# Patient Record
Sex: Female | Born: 1985 | Hispanic: No | Marital: Single | Smoking: Never smoker
Health system: Southern US, Community
[De-identification: ages and names within clinical notes are randomized; demographics above are authoritative.]

## PROBLEM LIST (undated history)

## (undated) DIAGNOSIS — G43909 Migraine, unspecified, not intractable, without status migrainosus: Secondary | ICD-10-CM

## (undated) DIAGNOSIS — R87629 Unspecified abnormal cytological findings in specimens from vagina: Secondary | ICD-10-CM

## (undated) DIAGNOSIS — D649 Anemia, unspecified: Secondary | ICD-10-CM

## (undated) HISTORY — PX: CERVICAL CONE BIOPSY: SUR198

## (undated) HISTORY — DX: Unspecified abnormal cytological findings in specimens from vagina: R87.629

## (undated) HISTORY — PX: CERVICAL CERCLAGE: SHX1329

---

## 2015-02-06 ENCOUNTER — Ambulatory Visit (INDEPENDENT_AMBULATORY_CARE_PROVIDER_SITE_OTHER): Payer: Medicaid Other | Admitting: Certified Nurse Midwife

## 2015-02-06 ENCOUNTER — Encounter: Payer: Self-pay | Admitting: Certified Nurse Midwife

## 2015-02-06 VITALS — BP 104/69 | HR 81

## 2015-02-06 DIAGNOSIS — Z30018 Encounter for initial prescription of other contraceptives: Secondary | ICD-10-CM

## 2015-02-06 DIAGNOSIS — Z113 Encounter for screening for infections with a predominantly sexual mode of transmission: Secondary | ICD-10-CM

## 2015-02-06 DIAGNOSIS — Z0000 Encounter for general adult medical examination without abnormal findings: Secondary | ICD-10-CM

## 2015-02-06 DIAGNOSIS — Z01419 Encounter for gynecological examination (general) (routine) without abnormal findings: Secondary | ICD-10-CM

## 2015-02-06 LAB — COMPREHENSIVE METABOLIC PANEL
ALT: 16 U/L (ref 6–29)
AST: 16 U/L (ref 10–30)
Albumin: 4.1 g/dL (ref 3.6–5.1)
Alkaline Phosphatase: 40 U/L (ref 33–115)
BUN: 11 mg/dL (ref 7–25)
CO2: 21 mmol/L (ref 20–31)
Calcium: 9.2 mg/dL (ref 8.6–10.2)
Chloride: 108 mmol/L (ref 98–110)
Creat: 0.9 mg/dL (ref 0.50–1.10)
Glucose, Bld: 73 mg/dL (ref 65–99)
Potassium: 4.1 mmol/L (ref 3.5–5.3)
Sodium: 139 mmol/L (ref 135–146)
Total Bilirubin: 0.3 mg/dL (ref 0.2–1.2)
Total Protein: 7.2 g/dL (ref 6.1–8.1)

## 2015-02-06 LAB — CBC WITH DIFFERENTIAL/PLATELET
Basophils Absolute: 0 10*3/uL (ref 0.0–0.1)
Basophils Relative: 1 % (ref 0–1)
Eosinophils Absolute: 0.1 10*3/uL (ref 0.0–0.7)
Eosinophils Relative: 2 % (ref 0–5)
HCT: 31.5 % — ABNORMAL LOW (ref 36.0–46.0)
Hemoglobin: 9.7 g/dL — ABNORMAL LOW (ref 12.0–15.0)
Lymphocytes Relative: 51 % — ABNORMAL HIGH (ref 12–46)
Lymphs Abs: 2.2 10*3/uL (ref 0.7–4.0)
MCH: 21.4 pg — ABNORMAL LOW (ref 26.0–34.0)
MCHC: 30.8 g/dL (ref 30.0–36.0)
MCV: 69.5 fL — ABNORMAL LOW (ref 78.0–100.0)
Monocytes Absolute: 0.3 10*3/uL (ref 0.1–1.0)
Monocytes Relative: 8 % (ref 3–12)
Neutro Abs: 1.6 10*3/uL — ABNORMAL LOW (ref 1.7–7.7)
Neutrophils Relative %: 38 % — ABNORMAL LOW (ref 43–77)
Platelets: 358 10*3/uL (ref 150–400)
RBC: 4.53 MIL/uL (ref 3.87–5.11)
RDW: 19 % — ABNORMAL HIGH (ref 11.5–15.5)
WBC: 4.3 10*3/uL (ref 4.0–10.5)

## 2015-02-06 LAB — CHOLESTEROL, TOTAL: Cholesterol: 154 mg/dL (ref 125–200)

## 2015-02-06 LAB — HDL CHOLESTEROL: HDL: 51 mg/dL (ref >=46)

## 2015-02-06 LAB — TRIGLYCERIDES: Triglycerides: 78 mg/dL (ref <150)

## 2015-02-06 MED ORDER — ETONOGESTREL-ETHINYL ESTRADIOL 0.12-0.015 MG/24HR VA RING: VAGINAL_RING | VAGINAL | Status: DC

## 2015-02-06 MED ORDER — CITRANATAL HARMONY 30-1-260 MG PO CAPS: 1.0000 | ORAL_CAPSULE | Freq: Every day | ORAL | Status: DC

## 2015-02-06 NOTE — Progress Notes (Signed)
Patient ID: Andrea Logan, female   DOB: Sep 24, 1985, 29 y.o.   MRN: [id number]    Subjective:        Andrea Logan is a 29 y.o. female here for a routine exam.  Current complaints:  Spotting with Depo-Provera injections.  Spotting, light, varies in duration and flow.  Has fiance', sexually active.   She is a Corporate treasurer.     Personal health questionnaire:  Is patient Ashkenazi Jewish, have a family history of breast and/or ovarian cancer: yes, MGM had ovarian CA Is there a family history of uterine cancer diagnosed at age < 77, gastrointestinal cancer, urinary tract cancer, family member who is a Personnel officer syndrome-associated carrier: no Is the patient overweight and hypertensive, family history of diabetes, personal history of gestational diabetes, preeclampsia or PCOS: yes Is patient over 42, have PCOS,  family history of premature CHD under age 61, diabetes, smoke, have hypertension or peripheral artery disease:  yes At any time, has a partner hit, kicked or otherwise hurt or frightened you?: no Over the past 2 weeks, have you felt down, depressed or hopeless?: no Over the past 2 weeks, have you felt little interest or pleasure in doing things?:no   Gynecologic History No LMP recorded. Contraception: Depo-Provera injections Last Pap: 2015. Results were: abnormal according to the patient, has had cone biopsy in the past Last mammogram: N/A.   Obstetric History OB History  Gravida Para Term Preterm AB SAB TAB Ectopic Multiple Living  3         3    # Outcome Date GA Lbr Len/2nd Weight Sex Delivery Anes PTL Lv  3 Gravida 11/25/13    M Vag-Spont   Y  2 Gravida 04/20/12    M Vag-Spont  Y Y  1 Gravida 06/20/10    F Vag-Spont   Y      History reviewed. No pertinent past medical history.  Past Surgical History  Procedure Laterality Date  . Cervical cone biopsy    . Cervical cerclage       Current outpatient prescriptions:  .  medroxyPROGESTERone (DEPO-PROVERA) 150 MG/ML  injection, Inject 150 mg into the muscle every 3 (three) months., Disp: , Rfl:  Not on File  Social History  Substance Use Topics  . Smoking status: Never Smoker   . Smokeless tobacco: Not on file  . Alcohol Use: No    Family History  Problem Relation Age of Onset  . Hypertension Mother   . Diabetes Mother   . Dementia Father       Review of Systems  Constitutional: negative for fatigue and weight loss Respiratory: negative for cough and wheezing Cardiovascular: negative for chest pain, fatigue and palpitations Gastrointestinal: negative for abdominal pain and change in bowel habits Musculoskeletal:negative for myalgias Neurological: negative for gait problems and tremors Behavioral/Psych: negative for abusive relationship, depression Endocrine: negative for temperature intolerance   Genitourinary:negative for abnormal menstrual periods, genital lesions, hot flashes, sexual problems and vaginal discharge Integument/breast: negative for breast lump, breast tenderness, nipple discharge and skin lesion(s)    Objective:       BP 104/69 mmHg  Pulse 81 General:   alert  Skin:   no rash or abnormalities  Lungs:   clear to auscultation bilaterally  Heart:   regular rate and rhythm, S1, S2 normal, no murmur, click, rub or gallop  Breasts:   normal without suspicious masses, skin or nipple changes or axillary nodes  Abdomen:  normal findings: no organomegaly, soft, non-tender and  no hernia  Pelvis:  External genitalia: normal general appearance Urinary system: urethral meatus normal and bladder without fullness, nontender Vaginal: normal without tenderness, induration or masses Cervix: normal appearance Adnexa: normal bimanual exam Uterus: anteverted and non-tender, normal size   Lab Review Urine pregnancy test Labs reviewed yes Radiologic studies reviewed no  50% of 30 min visit spent on counseling and coordination of care.   Assessment:    Healthy female exam.    Contraception counseling STD screening exam   Plan:    Education reviewed: calcium supplements, depression evaluation, low fat, low cholesterol diet, safe sex/STD prevention, self breast exams, skin cancer screening and weight bearing exercise. Contraception: NuvaRing vaginal inserts. Follow up in: 1 year.   Meds ordered this encounter  Medications  . medroxyPROGESTERone (DEPO-PROVERA) 150 MG/ML injection    Sig: Inject 150 mg into the muscle every 3 (three) months.   No orders of the defined types were placed in this encounter.    Possible management options include: LoLo

## 2015-02-07 ENCOUNTER — Other Ambulatory Visit: Payer: Self-pay | Admitting: Certified Nurse Midwife

## 2015-02-07 DIAGNOSIS — D500 Iron deficiency anemia secondary to blood loss (chronic): Secondary | ICD-10-CM

## 2015-02-07 LAB — PAP IG W/ RFLX HPV ASCU

## 2015-02-07 LAB — HEPATITIS B SURFACE ANTIGEN: Hepatitis B Surface Ag: NEGATIVE

## 2015-02-07 LAB — HEPATITIS C ANTIBODY: HCV Ab: NEGATIVE

## 2015-02-07 LAB — HIV ANTIBODY (ROUTINE TESTING W REFLEX): HIV 1&2 Ab, 4th Generation: NONREACTIVE

## 2015-02-07 LAB — RPR

## 2015-02-07 LAB — TSH: TSH: 0.944 u[IU]/mL (ref 0.350–4.500)

## 2015-02-07 MED ORDER — FUSION PLUS PO CAPS: 1.0000 | ORAL_CAPSULE | Freq: Every day | ORAL | Status: DC

## 2015-02-11 LAB — SURESWAB, VAGINOSIS/VAGINITIS PLUS
Atopobium vaginae: NOT DETECTED Log (cells/mL)
C. albicans, DNA: NOT DETECTED
C. glabrata, DNA: NOT DETECTED
C. parapsilosis, DNA: NOT DETECTED
C. trachomatis RNA, TMA: NOT DETECTED
C. tropicalis, DNA: NOT DETECTED
Gardnerella vaginalis: NOT DETECTED Log (cells/mL)
LACTOBACILLUS SPECIES: NOT DETECTED Log (cells/mL)
MEGASPHAERA SPECIES: NOT DETECTED Log (cells/mL)
N. gonorrhoeae RNA, TMA: NOT DETECTED
T. vaginalis RNA, QL TMA: NOT DETECTED

## 2015-03-13 ENCOUNTER — Ambulatory Visit: Payer: Medicaid Other | Admitting: Certified Nurse Midwife

## 2015-06-01 ENCOUNTER — Emergency Department (HOSPITAL_COMMUNITY)
Admission: EM | Admit: 2015-06-01 | Discharge: 2015-06-01 | Disposition: A | Discharge status: Home / Self Care | Payer: Medicaid Other | Attending: Emergency Medicine | Admitting: Emergency Medicine

## 2015-06-01 ENCOUNTER — Encounter (HOSPITAL_COMMUNITY): Payer: Self-pay | Admitting: Emergency Medicine

## 2015-06-01 DIAGNOSIS — J0100 Acute maxillary sinusitis, unspecified: Secondary | ICD-10-CM | POA: Insufficient documentation

## 2015-06-01 DIAGNOSIS — R0981 Nasal congestion: Secondary | ICD-10-CM | POA: Diagnosis present

## 2015-06-01 DIAGNOSIS — L309 Dermatitis, unspecified: Secondary | ICD-10-CM | POA: Diagnosis not present

## 2015-06-01 DIAGNOSIS — Z79899 Other long term (current) drug therapy: Secondary | ICD-10-CM | POA: Diagnosis not present

## 2015-06-01 DIAGNOSIS — D649 Anemia, unspecified: Secondary | ICD-10-CM | POA: Insufficient documentation

## 2015-06-01 DIAGNOSIS — Z793 Long term (current) use of hormonal contraceptives: Secondary | ICD-10-CM | POA: Diagnosis not present

## 2015-06-01 DIAGNOSIS — Z8679 Personal history of other diseases of the circulatory system: Secondary | ICD-10-CM | POA: Insufficient documentation

## 2015-06-01 HISTORY — DX: Migraine, unspecified, not intractable, without status migrainosus: G43.909

## 2015-06-01 HISTORY — DX: Anemia, unspecified: D64.9

## 2015-06-01 MED ORDER — AMOXICILLIN 500 MG PO CAPS: 500.0000 mg | ORAL_CAPSULE | Freq: Three times a day (TID) | ORAL | Status: DC

## 2015-06-01 MED ORDER — PSEUDOEPHEDRINE-IBUPROFEN 30-200 MG PO TABS: 1.0000 | ORAL_TABLET | Freq: Four times a day (QID) | ORAL | Status: DC | PRN

## 2015-06-01 NOTE — Discharge Instructions (Signed)

## 2015-06-01 NOTE — ED Provider Notes (Signed)
CSN: [id number]     Arrival date & time 06/01/15  1812 History   First MD Initiated Contact with Patient 06/01/15 1836     Chief Complaint  Patient presents with  . Rash  . Nasal Congestion  . Cough     (Consider location/radiation/quality/duration/timing/severity/associated sxs/prior Treatment) Patient is a 30 y.o. female presenting with URI. The history is provided by the patient.  URI Presenting symptoms: congestion, cough, facial pain, rhinorrhea and sore throat   Presenting symptoms: no ear pain and no fever   Severity:  Moderate Onset quality:  Gradual Duration:  3 days Timing:  Constant Progression:  Worsening Chronicity:  New Relieved by:  Nothing Worsened by:  Nothing tried Associated symptoms: headaches, myalgias and sinus pain   Risk factors: sick contacts    Andrea Logan is a 30 y.o. female who presents to the Ed with cough and congestion and a rash x 3 days. Her child has a similar rash and congestion. Child was evaluated for his rash in the Peds ED and treated for eczema. Patient complains of sinus pressure and pain that has gotten worse over the past couple days.   Past Medical History  Diagnosis Date  . Migraine   . Anemia    Past Surgical History  Procedure Laterality Date  . Cervical cone biopsy    . Cervical cerclage     Family History  Problem Relation Age of Onset  . Hypertension Mother   . Diabetes Mother   . Dementia Father    Social History  Substance Use Topics  . Smoking status: Never Smoker   . Smokeless tobacco: None  . Alcohol Use: No   OB History    Gravida Para Term Preterm AB TAB SAB Ectopic Multiple Living   3         3     Review of Systems  Constitutional: Negative for fever.  HENT: Positive for congestion, rhinorrhea, sinus pressure and sore throat. Negative for ear pain.   Respiratory: Positive for cough.   Musculoskeletal: Positive for myalgias.  Skin: Positive for rash.  Neurological: Positive for headaches.  all  other systems negative    Allergies  Review of patient's allergies indicates no known allergies.  Home Medications   Prior to Admission medications   Medication Sig Start Date End Date Taking? Authorizing Provider  amoxicillin (AMOXIL) 500 MG capsule Take 1 capsule (500 mg total) by mouth 3 (three) times daily. 06/01/15   Hope Orlene OchM Neese, NP  etonogestrel-ethinyl estradiol (NUVARING) 0.12-0.015 MG/24HR vaginal ring Insert vaginally and leave in place for 3 consecutive weeks, then remove for 1 week. 02/06/15   Rachelle A Denney, CNM  Iron-FA-B Cmp-C-Biot-Probiotic (FUSION PLUS) CAPS Take 1 tablet by mouth daily. 02/07/15   Rachelle A Denney, CNM  medroxyPROGESTERone (DEPO-PROVERA) 150 MG/ML injection Inject 150 mg into the muscle every 3 (three) months.    Historical Provider, MD  Prenat w/o A-FeCbn-DSS-FA-DHA (CITRANATAL HARMONY) 30-1-260 MG CAPS Take 1 tablet by mouth daily. 02/06/15   Rachelle A Denney, CNM  Pseudoephedrine-Ibuprofen 30-200 MG TABS Take 1 tablet by mouth every 6 (six) hours as needed. 06/01/15   Hope Orlene OchM Neese, NP   BP 100/70 mmHg  Pulse 80  Temp(Src) 98.3 F (36.8 C) (Oral)  Resp 16  Ht 5\' 9"  (1.753 m)  Wt 96.616 kg  BMI 31.44 kg/m2  SpO2 98% Physical Exam  Constitutional: She is oriented to person, place, and time. She appears well-developed and well-nourished. No distress.  HENT:  Head: Normocephalic.  Right Ear: Tympanic membrane normal.  Left Ear: Tympanic membrane normal.  Nose: Mucosal edema and rhinorrhea present. Right sinus exhibits maxillary sinus tenderness. Left sinus exhibits maxillary sinus tenderness.  Mouth/Throat: Uvula is midline and mucous membranes are normal. Posterior oropharyngeal erythema present.  Eyes: Conjunctivae and EOM are normal.  Neck: Normal range of motion. Neck supple.  Cardiovascular: Normal rate and regular rhythm.   Pulmonary/Chest: Effort normal and breath sounds normal. No respiratory distress.  Abdominal: Soft. There is no  tenderness.  Musculoskeletal: Normal range of motion.  Neurological: She is alert and oriented to person, place, and time. No cranial nerve deficit.  Skin: Skin is warm and dry.  Dry patchy areas with itching  Psychiatric: She has a normal mood and affect. Her behavior is normal.  Nursing note and vitals reviewed.   ED Course  Procedures   MDM  30 y.o. female with nasal congestion and sinus tenderness that has increased over the past few days stable for d/c without fever and does not appear toxic. Will treat for sinusitis and she will follow up with her PCP. Discussed with the patient and all questioned fully answered. She will return here if any problems arise.   Final diagnoses:  Acute maxillary sinusitis, recurrence not specified  Eczema       Janne Napoleon, NP 06/02/15 0003  Alvira Monday, MD 06/02/15 1521

## 2015-06-01 NOTE — ED Notes (Signed)
C/o non-productive cough, nasal congestion, and rash x 3 days.

## 2019-05-15 ENCOUNTER — Inpatient Hospital Stay (HOSPITAL_COMMUNITY)
Admission: AD | Admit: 2019-05-15 | Discharge: 2019-05-15 | Disposition: A | Discharge status: Home / Self Care | Payer: Medicaid Other | Attending: Family Medicine | Admitting: Family Medicine

## 2019-05-15 ENCOUNTER — Other Ambulatory Visit: Payer: Self-pay

## 2019-05-15 DIAGNOSIS — Z975 Presence of (intrauterine) contraceptive device: Secondary | ICD-10-CM | POA: Insufficient documentation

## 2019-05-15 DIAGNOSIS — R109 Unspecified abdominal pain: Secondary | ICD-10-CM | POA: Diagnosis not present

## 2019-05-15 NOTE — MAU Provider Note (Signed)
First Provider Initiated Contact with Patient 05/15/19 1739     S [name] is a 33 y.o. G3P0 non-pregnant female who presents to MAU today with request for IUD removal and pregnancy confirmation. Patient states she had an IUD placed about one year ago. She endorses recent mild abdominal cramping, spotting and states her partner told her he could feel her IUD during intercourse.  She has not taken a home pregnancy test  O BP 120/66 (BP Location: Right Arm)   Pulse 83   Temp 98.2 F (36.8 C) (Oral)   Resp 19   Ht 5\' 9"  (1.753 m)   Wt 113.4 kg   LMP 04/18/2019   SpO2 97%   BMI 36.92 kg/m  Physical Exam  Nursing note and vitals reviewed. Constitutional: She is oriented to person, place, and time. She appears well-developed and well-nourished.  Cardiovascular: Normal rate.  Respiratory: Effort normal.  Neurological: She is alert and oriented to person, place, and time.  Skin: Skin is warm and dry.  Psychiatric: She has a normal mood and affect. Her behavior is normal. Judgment and thought content normal.   A Non pregnant female Medical screening exam complete  P Discharge from MAU in stable condition Patient given the option of transfer to [facility] for further evaluation or seek care in outpatient facility of choice List of options for follow-up given  Warning signs for worsening condition that would warrant emergency follow-up discussed Patient may return to MAU as needed for pregnancy related complaints  Mallie Snooks, CNM 05/15/2019 6:51 PM

## 2019-05-15 NOTE — MAU Note (Signed)
Pt presents with c/o VB and cramping.  States she thinks it's d/t her IUD being "out of place"

## 2019-05-31 ENCOUNTER — Other Ambulatory Visit: Payer: Self-pay

## 2019-05-31 ENCOUNTER — Other Ambulatory Visit (HOSPITAL_COMMUNITY)
Admission: RE | Admit: 2019-05-31 | Discharge: 2019-05-31 | Disposition: A | Discharge status: Home / Self Care | Payer: Medicaid Other | Source: Ambulatory Visit | Attending: Obstetrics and Gynecology | Admitting: Obstetrics and Gynecology

## 2019-05-31 ENCOUNTER — Encounter: Payer: Self-pay | Admitting: Obstetrics and Gynecology

## 2019-05-31 ENCOUNTER — Ambulatory Visit: Payer: Medicaid Other | Admitting: Obstetrics and Gynecology

## 2019-05-31 VITALS — BP 122/74 | HR 99 | Temp 98.6°F | Ht 69.0 in | Wt 250.6 lb

## 2019-05-31 DIAGNOSIS — Z01419 Encounter for gynecological examination (general) (routine) without abnormal findings: Secondary | ICD-10-CM | POA: Insufficient documentation

## 2019-05-31 DIAGNOSIS — N898 Other specified noninflammatory disorders of vagina: Secondary | ICD-10-CM | POA: Insufficient documentation

## 2019-05-31 DIAGNOSIS — T8332XA Displacement of intrauterine contraceptive device, initial encounter: Secondary | ICD-10-CM

## 2019-05-31 DIAGNOSIS — Z113 Encounter for screening for infections with a predominantly sexual mode of transmission: Secondary | ICD-10-CM | POA: Diagnosis not present

## 2019-05-31 DIAGNOSIS — Z0000 Encounter for general adult medical examination without abnormal findings: Secondary | ICD-10-CM

## 2019-05-31 NOTE — Progress Notes (Signed)
   WELL-WOMAN PHYSICAL & PAP Patient name: Andrea Logan MRN [id number]  Date of birth: 02/03/1986 Chief Complaint:   Gynecologic Exam  History of Present Illness:   Andrea Logan is a 34 y.o. G3P0 single African American female being seen today for a routine well-woman exam and possible removal of IUD. She is considering a Nexplanon, if her IUD is removed today.  Current complaints: irregular bleeding and partner "can feel the strings".  PCP: none      does desire labs No LMP recorded. (Menstrual status: IUD). The current method of family planning is IUD.  Last pap possibly 2016. In [place]. Results were: normal per pt  Review of Systems:   Pertinent items are noted in HPI Denies any headaches, blurred vision, fatigue, shortness of breath, chest pain, abdominal pain, abnormal vaginal discharge/itching/odor/irritation, problems with periods, bowel movements, urination, or intercourse unless otherwise stated above. Pertinent History Reviewed:  Reviewed past medical,surgical, social and family history.  Reviewed problem list, medications and allergies. Physical Assessment:   Vitals:   05/31/19 0915  BP: 122/74  Pulse: 99  Temp: 98.6 F (37 C)  TempSrc: Oral  Weight: 250 lb 9.6 oz (113.7 kg)  Height: 5\' 9"  (1.753 m)  Body mass index is 37.01 kg/m.        Physical Examination:   General appearance - well appearing, and in no distress  Mental status - alert, oriented to person, place, and time  Psych:  She has a normal mood and affect  Skin - warm and dry, normal color, no suspicious lesions noted  Chest - effort normal, all lung fields clear to auscultation bilaterally  Heart - normal rate and regular rhythm  Neck:  midline trachea, no thyromegaly or nodules  Breasts - breasts appear normal, no suspicious masses, no skin or nipple changes or  axillary nodes  Abdomen - soft, nontender, nondistended, no masses or organomegaly  Pelvic - VULVA: normal appearing vulva with no  masses, tenderness or lesions  VAGINA: normal appearing vagina with normal color and discharge, no lesions  CERVIX: normal appearing cervix without discharge or lesions, no CMT  Thin prep pap is done with HR HPV cotesting  UTERUS: uterus is felt to be normal size, shape, consistency and nontender   ADNEXA: No adnexal masses or tenderness noted.  Rectal - deferred  Extremities:  No swelling or varicosities noted  No results found for this or any previous visit (from the past 24 hour(s)).  Assessment & Plan:  1) Well-Woman Exam with Pap  2) Pap smear, as part of routine gynecological examination - Plan: Cytology - PAP( [place])  3) Screen for STD (sexually transmitted disease)  - Cervicovaginal ancillary only( [place]),  - HIV antibody (with reflex),  - RPR  4) Vaginal discharge  - Cervicovaginal ancillary only( [place])  5) Vaginal odor  - Cervicovaginal ancillary only( [place])  6) Intrauterine contraceptive device threads lost, initial encounter  - PELVIS LIMITED (TRANSABDOMINAL ONLY)   Labs/procedures today: pap and STI testing   Orders Placed This Encounter  Procedures  . HIV antibody (with reflex)  . RPR    Meds: No orders of the defined types were placed in this encounter.   Follow-up: No follow-ups on file.  [place] MSN, CNM 05/31/2019 10:03 AM

## 2019-05-31 NOTE — Patient Instructions (Addendum)
Since your IUD strings were not well visualized on my exam. I have decided for you to have an outpatient formal pelvic ultrasound. Levin Erp will get that scheduled for you and either she will call you with that appointment or the ultrasound department will call you to get it scheduled.

## 2019-06-01 LAB — RPR: RPR Ser Ql: NONREACTIVE

## 2019-06-01 LAB — HIV ANTIBODY (ROUTINE TESTING W REFLEX): HIV Screen 4th Generation wRfx: NONREACTIVE

## 2019-06-04 LAB — CERVICOVAGINAL ANCILLARY ONLY
Bacterial Vaginitis (gardnerella): POSITIVE — AB
Candida Glabrata: NEGATIVE
Candida Vaginitis: NEGATIVE
Chlamydia: NEGATIVE
Comment: NEGATIVE
Comment: NEGATIVE
Comment: NEGATIVE
Comment: NEGATIVE
Comment: NEGATIVE
Comment: NORMAL
Neisseria Gonorrhea: NEGATIVE
Trichomonas: NEGATIVE

## 2019-06-04 LAB — CYTOLOGY - PAP
Comment: NEGATIVE
Diagnosis: NEGATIVE
High risk HPV: NEGATIVE

## 2019-06-05 ENCOUNTER — Telehealth: Payer: Self-pay | Admitting: *Deleted

## 2019-06-05 DIAGNOSIS — B9689 Other specified bacterial agents as the cause of diseases classified elsewhere: Secondary | ICD-10-CM

## 2019-06-05 DIAGNOSIS — N760 Acute vaginitis: Secondary | ICD-10-CM

## 2019-06-05 MED ORDER — METRONIDAZOLE 500 MG PO TABS: 500.0000 mg | ORAL_TABLET | Freq: Two times a day (BID) | ORAL | 0 refills | Status: DC

## 2019-06-05 NOTE — Telephone Encounter (Signed)
-----   Message from Raelyn Mora, [place] sent at 06/05/2019 11:30 AM EST ----- Please treat for BV

## 2019-06-11 ENCOUNTER — Other Ambulatory Visit: Payer: Self-pay | Admitting: *Deleted

## 2019-06-11 DIAGNOSIS — T8332XA Displacement of intrauterine contraceptive device, initial encounter: Secondary | ICD-10-CM

## 2019-06-12 ENCOUNTER — Ambulatory Visit (HOSPITAL_COMMUNITY): Admission: RE | Admit: 2019-06-12 | Payer: Medicaid Other | Source: Ambulatory Visit

## 2019-06-12 ENCOUNTER — Ambulatory Visit (HOSPITAL_COMMUNITY): Payer: Medicaid Other

## 2019-06-19 ENCOUNTER — Ambulatory Visit (HOSPITAL_COMMUNITY): Admission: RE | Admit: 2019-06-19 | Payer: Medicaid Other | Source: Ambulatory Visit

## 2019-06-22 ENCOUNTER — Encounter (HOSPITAL_COMMUNITY): Payer: Self-pay

## 2019-06-22 ENCOUNTER — Ambulatory Visit (HOSPITAL_COMMUNITY): Admission: RE | Admit: 2019-06-22 | Payer: Medicaid Other | Source: Ambulatory Visit

## 2019-12-18 ENCOUNTER — Other Ambulatory Visit: Payer: Self-pay

## 2019-12-18 ENCOUNTER — Ambulatory Visit (INDEPENDENT_AMBULATORY_CARE_PROVIDER_SITE_OTHER): Payer: Medicaid Other | Admitting: *Deleted

## 2019-12-18 ENCOUNTER — Other Ambulatory Visit (HOSPITAL_COMMUNITY)
Admission: RE | Admit: 2019-12-18 | Discharge: 2019-12-18 | Disposition: A | Discharge status: Home / Self Care | Payer: Medicaid Other | Source: Ambulatory Visit | Attending: Obstetrics and Gynecology | Admitting: Obstetrics and Gynecology

## 2019-12-18 VITALS — BP 105/65 | HR 87 | Temp 98.3°F | Ht 69.0 in | Wt 229.2 lb

## 2019-12-18 DIAGNOSIS — N920 Excessive and frequent menstruation with regular cycle: Secondary | ICD-10-CM

## 2019-12-18 DIAGNOSIS — N898 Other specified noninflammatory disorders of vagina: Secondary | ICD-10-CM | POA: Insufficient documentation

## 2019-12-18 NOTE — Progress Notes (Signed)
° °  SUBJECTIVE:  34 y.o. female complains of malodorous vaginal discharge and spotting for 3 day(s). Denies significant pelvic pain or fever. No UTI symptoms. Denies history of known exposure to STD. Last sexual intercourse about 2-3 days ago; per patient protected and unprotected. Patient stated she used boric acid yesterday for her symptoms.  Patient's last menstrual period was 11/26/2019 (exact date).  OBJECTIVE:  She appears well, afebrile. Urine dipstick: not done.  ASSESSMENT:  Vaginal Discharge  Vaginal Odor   PLAN:  GC, chlamydia, trichomonas, BVAG, CVAG probe sent to lab. Treatment: To be determined once lab results are received ROV prn if symptoms persist or worsen.  Clovis Pu, RN

## 2019-12-18 NOTE — Patient Instructions (Addendum)
Vaginitis  Vaginitis is irritation and swelling (inflammation) of the vagina. It happens when normal bacteria and yeast in the vagina grow too much. There are many types of this condition. Treatment will depend on the type you have. Follow these instructions at home: Lifestyle  Keep your vagina area clean and dry. ? Avoid using soap. ? Rinse the area with water.  Do not do the following until your doctor says it is okay: ? Wash and clean out the vagina (douche). ? Use tampons. ? Have sex.  Wipe from front to back after going to the bathroom.  Let air reach your vagina. ? Wear cotton underwear. ? Do not wear:  Underwear while you sleep.  Tight pants.  Thong underwear.  Underwear or nylons without a cotton panel. ? Take off any wet clothing, such as bathing suits, as soon as possible.  Use gentle, non-scented products. Do not use things that can irritate the vagina, such as fabric softeners. Avoid the following products if they are scented: ? Feminine sprays. ? Detergents. ? Tampons. ? Feminine hygiene products. ? Soaps or bubble baths.  Practice safe sex and use condoms. General instructions  Take over-the-counter and prescription medicines only as told by your doctor.  If you were prescribed an antibiotic medicine, take or use it as told by your doctor. Do not stop taking or using the antibiotic even if you start to feel better.  Keep all follow-up visits as told by your doctor. This is important. Contact a doctor if:  You have pain in your belly.  You have a fever.  Your symptoms last for more than 2-3 days. Get help right away if:  You have a fever and your symptoms get worse all of a sudden. Summary  Vaginitis is irritation and swelling of the vagina. It can happen when the normal bacteria and yeast in the vagina grow too much. There are many types.  Treatment will depend on the type you have.  Do not douche, use tampons , or have sex until your  health care provider approves. When you can return to sex, practice safe sex and use condoms. This information is not intended to replace advice given to you by your health care provider. Make sure you discuss any questions you have with your health care provider. Document Revised: 04/15/2017 Document Reviewed: 05/25/2016 Elsevier Patient Education  2020 [employer]  Ingrown Hair  An ingrown hair is a hair that curls and re-enters the skin instead of growing straight out of the skin. An ingrown hair can develop in any part of the skin that hair is removed from. An ingrown hair may cause Zeitlin pockets of infection. What are the causes? An ingrown hair may be caused by:  Shaving.  Tweezing.  Waxing.  Using a hair removal cream. What increases the risk? You are more likely to develop this condition if you have curly hair. What are the signs or symptoms? Symptoms of an ingrown hair may include:  Mah bumps on the skin. The bumps may be filled with pus.  Pain.  Itching. How is this diagnosed? An ingrown hair is diagnosed by a skin exam. How is this treated? Treatment is often not needed unless the ingrown hair has caused an infection. If needed, treatment may include:  Applying prescription creams to the skin. This can help with inflammation.  Applying warm compresses to the skin. This can help soften the skin.  Taking antibiotic medicine. An antibiotic may be prescribed if  the infection is severe.  Retracting and releasing the ingrown hair tips. Follow these instructions at home:  Medicines  Take, apply, or use over-the-counter and prescription medicines only as told by your health care provider. This includes any prescription creams.  If you were prescribed an antibiotic medicine, take it as told by your health care provider. Do not stop using the antibiotic even if you start to feel better. General instructions  Do not shave irritated areas of skin. You may start  shaving these areas again once the irritation has gone away.  To help remove ingrown hairs on your face, you may use a facial sponge in a gentle circular motion.  Do not pick or squeeze the irritated area of skin as this may cause infection and scarring. Managing pain and swelling  If directed, apply heat to the affected area as often as told by your health care provider. Use the heat source that your health care provider recommends, such as a moist heat pack or a heating pad. ? Place a towel between your skin and the heat source. ? Leave the heat on for 20-30 minutes. ? Remove the heat if your skin turns bright red. This is especially important if you are unable to feel pain, heat, or cold. You may have a greater risk of getting burned. How is this prevented?  Shower before shaving.  Wrap areas that you are going to shave in warm, moist wraps for several minutes before shaving. The warmth and moisture help to soften the hairs and makes ingrown hairs less likely.  Use thick shaving gels.  Use a razor that cuts hair slightly above your skin, or use an electric shaver with a long shave setting.  Shave in the direction of hair growth.  Avoid making multiple razor strokes.  Apply a moisturizing lotion after shaving. Summary  An ingrown hair is a hair that curls and re-enters the skin instead of growing straight out of the skin.  Treatment is often not needed unless the ingrown hair has caused an infection.  Take, apply, or use over-the-counter and prescription medicines only as told by your health care provider. This includes any prescription creams.  Do not shave irritated areas of skin. You may start shaving these areas again once the irritation has gone away.  If directed, apply heat to the affected area. Use the heat source that your health care provider recommends, such as a moist heat pack or a heating pad. This information is not intended to replace advice given to you by your  health care provider. Make sure you discuss any questions you have with your health care provider. Document Revised: 11/09/2017 Document Reviewed: 11/09/2017 Elsevier Patient Education  2020 [employer].

## 2019-12-19 ENCOUNTER — Other Ambulatory Visit: Payer: Self-pay | Admitting: *Deleted

## 2019-12-19 DIAGNOSIS — B9689 Other specified bacterial agents as the cause of diseases classified elsewhere: Secondary | ICD-10-CM

## 2019-12-19 DIAGNOSIS — A599 Trichomoniasis, unspecified: Secondary | ICD-10-CM

## 2019-12-19 LAB — CERVICOVAGINAL ANCILLARY ONLY
Bacterial Vaginitis (gardnerella): POSITIVE — AB
Candida Glabrata: NEGATIVE
Candida Vaginitis: NEGATIVE
Chlamydia: NEGATIVE
Comment: NEGATIVE
Comment: NEGATIVE
Comment: NEGATIVE
Comment: NEGATIVE
Comment: NEGATIVE
Comment: NORMAL
Neisseria Gonorrhea: NEGATIVE
Trichomonas: POSITIVE — AB

## 2019-12-19 MED ORDER — METRONIDAZOLE 500 MG PO TABS: 500.0000 mg | ORAL_TABLET | Freq: Two times a day (BID) | ORAL | 0 refills | Status: DC

## 2019-12-19 NOTE — Progress Notes (Signed)
Patient called regarding vaginal swab results. Patient positive for BV and Trich. Metronidazole 500 mg 1 PO BID x 7 days with food.  Clovis Pu, RN

## 2020-01-17 ENCOUNTER — Encounter: Payer: Self-pay | Admitting: Obstetrics and Gynecology

## 2020-01-17 ENCOUNTER — Ambulatory Visit (INDEPENDENT_AMBULATORY_CARE_PROVIDER_SITE_OTHER): Payer: Medicaid Other | Admitting: Obstetrics and Gynecology

## 2020-01-17 ENCOUNTER — Other Ambulatory Visit: Payer: Self-pay

## 2020-01-17 ENCOUNTER — Other Ambulatory Visit (HOSPITAL_COMMUNITY)
Admission: RE | Admit: 2020-01-17 | Discharge: 2020-01-17 | Disposition: A | Discharge status: Home / Self Care | Payer: Medicaid Other | Source: Ambulatory Visit | Attending: Obstetrics and Gynecology | Admitting: Obstetrics and Gynecology

## 2020-01-17 ENCOUNTER — Ambulatory Visit: Payer: Medicaid Other | Admitting: Obstetrics and Gynecology

## 2020-01-17 VITALS — BP 116/75 | HR 88 | Temp 98.0°F | Ht 69.0 in | Wt 231.6 lb

## 2020-01-17 DIAGNOSIS — Z113 Encounter for screening for infections with a predominantly sexual mode of transmission: Secondary | ICD-10-CM

## 2020-01-17 DIAGNOSIS — Z3040 Encounter for surveillance of contraceptives, unspecified: Secondary | ICD-10-CM

## 2020-01-17 NOTE — Progress Notes (Signed)
  GYNECOLOGY PROGRESS NOTE  History:  [name] is a 34 y.o. G3P0 presents to [facility] office today for problem gyn visit. She reports recurrent BV that she thinks is caused by the IUD. She also noticed the recurrent BV after becoming sexually active with the most recent partner who gave her Trichomonas. She reports that she has tried the pill, the patch, Depo, and Mirena IUD. She does not desire to try either one of those methods. She desires a BTL and reports that she has wanted a BTL before getting pregnant with her 2nd child. She denies h/a, dizziness, shortness of breath, n/v, or fever/chills.    The following portions of the patient's history were reviewed and updated as appropriate: allergies, current medications, past family history, past medical history, past social history, past surgical history and problem list.  Review of Systems:  Pertinent items are noted in HPI.  Objective:  Physical Exam Blood pressure 116/75, pulse 88, temperature 98 F (36.7 C), temperature source Oral, height 5\' 9"  (1.753 m), weight 231 lb 9.6 oz (105.1 kg), last menstrual period 01/12/2020. VS reviewed, nursing note reviewed,  Constitutional: well developed, well nourished, no distress HEENT: normocephalic CV: normal rate Pulm/chest wall: normal effort Breast Exam: deferred Abdomen: soft Neuro: alert and oriented x 3 Skin: warm, dry Psych: affect normal Pelvic exam: deferred  Assessment & Plan:  1. Screening examination for STD (sexually transmitted disease) - Cervicovaginal ancillary only ([place]) --TOC  2. Encounter for surveillance of contraceptive device - Patient declines Nexplanon, patch, pill, injection, strongly desires to have BTL - Patient is assured she is "done having babies" - Plan to schedule IUD removal and consultation with MD about BTL  Total time spent discussing [facility] options and coordinating follow-up with MD was 10 minutes.  [facility] CNM 3:22 PM

## 2020-01-18 ENCOUNTER — Other Ambulatory Visit: Payer: Self-pay | Admitting: Obstetrics and Gynecology

## 2020-01-18 DIAGNOSIS — B9689 Other specified bacterial agents as the cause of diseases classified elsewhere: Secondary | ICD-10-CM

## 2020-01-18 DIAGNOSIS — A599 Trichomoniasis, unspecified: Secondary | ICD-10-CM

## 2020-01-18 DIAGNOSIS — N760 Acute vaginitis: Secondary | ICD-10-CM

## 2020-01-18 LAB — CERVICOVAGINAL ANCILLARY ONLY
Bacterial Vaginitis (gardnerella): POSITIVE — AB
Candida Glabrata: NEGATIVE
Candida Vaginitis: NEGATIVE
Chlamydia: NEGATIVE
Comment: NEGATIVE
Comment: NEGATIVE
Comment: NEGATIVE
Comment: NEGATIVE
Comment: NEGATIVE
Comment: NORMAL
Neisseria Gonorrhea: NEGATIVE
Trichomonas: NEGATIVE

## 2020-01-18 MED ORDER — METRONIDAZOLE 500 MG PO TABS: 500.0000 mg | ORAL_TABLET | Freq: Two times a day (BID) | ORAL | 0 refills | Status: DC

## 2020-01-22 ENCOUNTER — Other Ambulatory Visit: Payer: Self-pay

## 2020-01-22 ENCOUNTER — Encounter (INDEPENDENT_AMBULATORY_CARE_PROVIDER_SITE_OTHER): Payer: Self-pay | Admitting: Primary Care

## 2020-01-22 ENCOUNTER — Ambulatory Visit (INDEPENDENT_AMBULATORY_CARE_PROVIDER_SITE_OTHER): Payer: Medicaid Other | Admitting: Primary Care

## 2020-01-22 VITALS — BP 105/64 | HR 84 | Temp 98.5°F | Ht 69.0 in | Wt 232.0 lb

## 2020-01-22 DIAGNOSIS — Z23 Encounter for immunization: Secondary | ICD-10-CM

## 2020-01-22 DIAGNOSIS — Z6834 Body mass index (BMI) 34.0-34.9, adult: Secondary | ICD-10-CM

## 2020-01-22 DIAGNOSIS — E6609 Other obesity due to excess calories: Secondary | ICD-10-CM | POA: Diagnosis not present

## 2020-01-22 DIAGNOSIS — Z3009 Encounter for other general counseling and advice on contraception: Secondary | ICD-10-CM | POA: Diagnosis not present

## 2020-01-22 DIAGNOSIS — Z7689 Persons encountering health services in other specified circumstances: Secondary | ICD-10-CM | POA: Diagnosis not present

## 2020-01-22 NOTE — Progress Notes (Signed)
Established Patient Office Visit  Subjective:  Patient ID: Andrea Logan, female    DOB: 01-08-1986  Age: 34 y.o. MRN: [id number]  CC:  Chief Complaint  Patient presents with  . New Patient (Initial Visit)    HPI Andrea Logan is a 34 year old female who presents today to establish care. Discussed wanting to change birth control.  Past Medical History:  Diagnosis Date  . Anemia   . Migraine     Past Surgical History:  Procedure Laterality Date  . CERVICAL CERCLAGE    . CERVICAL CONE BIOPSY      Family History  Problem Relation Age of Onset  . Hypertension Mother   . Diabetes Mother   . Dementia Father     Social History   Socioeconomic History  . Marital status: Unknown    Spouse name: Not on file  . Number of children: Not on file  . Years of education: Not on file  . Highest education level: Not on file  Occupational History  . Not on file  Tobacco Use  . Smoking status: Never Smoker  . Smokeless tobacco: Never Used  Vaping Use  . Vaping Use: Never used  Substance and Sexual Activity  . Alcohol use: No    Alcohol/week: 0.0 standard drinks  . Drug use: No  . Sexual activity: Yes    Birth control/protection: I.U.D.  Other Topics Concern  . Not on file  Social History Narrative  . Not on file   Social Determinants of Health   Financial Resource Strain:   . Difficulty of Paying Living Expenses: Not on file  Food Insecurity:   . Worried About Programme researcher, broadcasting/film/video in the Last Year: Not on file  . Ran Out of Food in the Last Year: Not on file  Transportation Needs:   . Lack of Transportation (Medical): Not on file  . Lack of Transportation (Non-Medical): Not on file  Physical Activity:   . Days of Exercise per Week: Not on file  . Minutes of Exercise per Session: Not on file  Stress:   . Feeling of Stress : Not on file  Social Connections:   . Frequency of Communication with Friends and Family: Not on file  . Frequency of Social Gatherings  with Friends and Family: Not on file  . Attends Religious Services: Not on file  . Active Member of Clubs or Organizations: Not on file  . Attends Banker Meetings: Not on file  . Marital Status: Not on file  Intimate Partner Violence:   . Fear of Current or Ex-Partner: Not on file  . Emotionally Abused: Not on file  . Physically Abused: Not on file  . Sexually Abused: Not on file    Outpatient Medications Prior to Visit  Medication Sig Dispense Refill  . metroNIDAZOLE (FLAGYL) 500 MG tablet Take 1 tablet (500 mg total) by mouth 2 (two) times daily. 14 tablet 0  . amoxicillin (AMOXIL) 500 MG capsule Take 1 capsule (500 mg total) by mouth 3 (three) times daily. (Patient not taking: Reported on 05/31/2019) 30 capsule 0  . etonogestrel-ethinyl estradiol (NUVARING) 0.12-0.015 MG/24HR vaginal ring Insert vaginally and leave in place for 3 consecutive weeks, then remove for 1 week. 1 each 12  . Iron-FA-B Cmp-C-Biot-Probiotic (FUSION PLUS) CAPS Take 1 tablet by mouth daily. (Patient not taking: Reported on 05/31/2019) 30 capsule 4  . medroxyPROGESTERone (DEPO-PROVERA) 150 MG/ML injection Inject 150 mg into the muscle every 3 (three) months. (  Patient not taking: Reported on 12/18/2019)    . Multiple Vitamin (MULTIVITAMIN) tablet Take 1 tablet by mouth daily. (Patient not taking: Reported on 12/18/2019)    . Prenat w/o A-FeCbn-DSS-FA-DHA (CITRANATAL HARMONY) 30-1-260 MG CAPS Take 1 tablet by mouth daily. (Patient not taking: Reported on 05/31/2019) 30 capsule 12  . Pseudoephedrine-Ibuprofen 30-200 MG TABS Take 1 tablet by mouth every 6 (six) hours as needed. (Patient not taking: Reported on 05/31/2019) 24 each 0   No facility-administered medications prior to visit.    No Known Allergies  ROS Review of Systems  All other systems reviewed and are negative.     Objective:    Physical Exam General: Vital signs reviewed.  Patient is well-developed and well-nourished,obese female Body  mass index is 34.26 kg/m. in no acute distress and cooperative with exam.  Head: Normocephalic and atraumatic. Eyes: EOMI, conjunctivae normal, no scleral icterus.  Neck: Supple, trachea midline, normal ROM, no JVD, masses, thyromegaly, or carotid bruit present.  Cardiovascular: RRR, S1 normal, S2 normal, no murmurs, gallops, or rubs. Pulmonary/Chest: Clear to auscultation bilaterally, no wheezes, rales, or rhonchi. Abdominal: Soft, non-tender, non-distended, BS +, no masses, organomegaly, or guarding present.  Musculoskeletal: No joint deformities, erythema, or stiffness, ROM full and nontender. Extremities: No lower extremity edema bilaterally,  pulses symmetric and intact bilaterally. No cyanosis or clubbing. Neurological: A&O x3, Strength is normal and symmetric bilaterally, cranial nerve II-XII are grossly intact, no focal motor deficit, sensory intact to light touch bilaterally.  Skin: Warm, dry and intact. No rashes or erythema. Psychiatric: Normal mood and affect. speech and behavior is normal. Cognition and memory are normal.   BP 105/64 (BP Location: Right Arm, Patient Position: Sitting, Cuff Size: Large)   Pulse 84   Temp 98.5 F (36.9 C) (Oral)   Ht 5\' 9"  (1.753 m)   Wt 232 lb (105.2 kg)   LMP 01/12/2020   SpO2 97%   BMI 34.26 kg/m  Wt Readings from Last 3 Encounters:  01/22/20 232 lb (105.2 kg)  01/17/20 231 lb 9.6 oz (105.1 kg)  12/18/19 229 lb 3.2 oz (104 kg)     Health Maintenance Due  Topic Date Due  . COVID-19 Vaccine (1) Never done  . TETANUS/TDAP  Never done    There are no preventive care reminders to display for this patient.  Lab Results  Component Value Date   TSH 0.944 02/06/2015   Lab Results  Component Value Date   WBC 4.3 02/06/2015   HGB 9.7 (L) 02/06/2015   HCT 31.5 (L) 02/06/2015   MCV 69.5 (L) 02/06/2015   PLT 358 02/06/2015   Lab Results  Component Value Date   NA 139 02/06/2015   K 4.1 02/06/2015   CO2 21 02/06/2015   GLUCOSE  73 02/06/2015   BUN 11 02/06/2015   CREATININE 0.90 02/06/2015   BILITOT 0.3 02/06/2015   ALKPHOS 40 02/06/2015   AST 16 02/06/2015   ALT 16 02/06/2015   PROT 7.2 02/06/2015   ALBUMIN 4.1 02/06/2015   CALCIUM 9.2 02/06/2015   Lab Results  Component Value Date   CHOL 154 02/06/2015   Lab Results  Component Value Date   HDL 51 02/06/2015   No results found for: [facility] Lab Results  Component Value Date   TRIG 78 02/06/2015   No results found for: CHOLHDL No results found for: 02/08/2015    Assessment & Plan:   Rosely was seen today for new patient (initial visit).  Diagnoses and all  orders for this visit:  Encounter to establish care Gwinda Passe, NP-C will be your  (PCP) she is mastered prepared . She is skilled to diagnosed and treat illness. Also able to answer health concern as well as continuing care of varied medical conditions, not limited by cause, organ system, or diagnosis.    Birth control counseling scheduled appointment at [facility] to discussed permanent birth control options    Class 1 obesity due to excess calories without serious comorbidity with body mass index (BMI) of 34.0 to 34.9 in adult Obesity is 30-39 indicating an excess in caloric intake or underlining conditions. This may lead to other co-morbidities. Example HTN, DM, CVD.Lifestyle modifications of diet and exercise may reduce obesity.     Follow-up: Return in about 6 months (around 07/21/2020), or if symptoms worsen or fail to improve, for weight management in person.    Grayce Sessions, NP

## 2020-01-22 NOTE — Patient Instructions (Addendum)
https://www.cdc.gov/vaccines/hcp/vis/vis-statements/tdap.pdf">  Tdap (Tetanus, Diphtheria, Pertussis) Vaccine: What You Need to Know 1. Why get vaccinated? Tdap vaccine can prevent tetanus, diphtheria, and pertussis. Diphtheria and pertussis spread from person to person. Tetanus enters the body through cuts or wounds.  TETANUS (T) causes painful stiffening of the muscles. Tetanus can lead to serious health problems, including being unable to open the mouth, having trouble swallowing and breathing, or death.  DIPHTHERIA (D) can lead to difficulty breathing, heart failure, paralysis, or death.  PERTUSSIS (aP), also known as "whooping cough," can cause uncontrollable, violent coughing which makes it hard to breathe, eat, or drink. Pertussis can be extremely serious in babies and young children, causing pneumonia, convulsions, brain damage, or death. In teens and adults, it can cause weight loss, loss of bladder control, passing out, and rib fractures from severe coughing. 2. Tdap vaccine Tdap is only for children 7 years and older, adolescents, and adults.  Adolescents should receive a single dose of Tdap, preferably at age 53 or 35 years. Pregnant women should get a dose of Tdap during every pregnancy, to protect the newborn from pertussis. Infants are most at risk for severe, life-threatening complications from pertussis. Adults who have never received Tdap should get a dose of Tdap. Also, adults should receive a booster dose every 10 years, or earlier in the case of a severe and dirty wound or burn. Booster doses can be either Tdap or Td (a different vaccine that protects against tetanus and diphtheria but not pertussis). Tdap may be given at the same time as other vaccines. 3. Talk with your health care provider Tell your vaccine provider if the person getting the vaccine:  Has had an allergic reaction after a previous dose of any vaccine that protects against tetanus, diphtheria, or pertussis,  or has any severe, life-threatening allergies.  Has had a coma, decreased level of consciousness, or prolonged seizures within 7 days after a previous dose of any pertussis vaccine (DTP, DTaP, or Tdap).  Has seizures or another nervous system problem.  Has ever had Guillain-Barr Syndrome (also called GBS).  Has had severe pain or swelling after a previous dose of any vaccine that protects against tetanus or diphtheria. In some cases, your health care provider may decide to postpone Tdap vaccination to a future visit.  People with minor illnesses, such as a cold, may be vaccinated. People who are moderately or severely ill should usually wait until they recover before getting Tdap vaccine.  Your health care provider can give you more information. 4. Risks of a vaccine reaction  Pain, redness, or swelling where the shot was given, mild fever, headache, feeling tired, and nausea, vomiting, diarrhea, or stomachache sometimes happen after Tdap vaccine. People sometimes faint after medical procedures, including vaccination. Tell your provider if you feel dizzy or have vision changes or ringing in the ears.  As with any medicine, there is a very remote chance of a vaccine causing a severe allergic reaction, other serious injury, or death. 5. What if there is a serious problem? An allergic reaction could occur after the vaccinated person leaves the clinic. If you see signs of a severe allergic reaction (hives, swelling of the face and throat, difficulty breathing, a fast heartbeat, dizziness, or weakness), call 9-1-1 and get the person to the nearest hospital. For other signs that concern you, call your health care provider.  Adverse reactions should be reported to the Vaccine Adverse Event Reporting System (VAERS). Your health care provider will usually file this report,  or you can do it yourself. Visit the VAERS website at www.vaers.LAgents.no or call 778-151-8343. VAERS is only for reporting  reactions, and VAERS staff do not give medical advice. 6. The National Vaccine Injury Compensation Program The [employer] Vaccine Injury Compensation Program (VICP) is a federal program that was created to compensate people who may have been injured by certain vaccines. Visit the VICP website at SpiritualWord.at or call (231) 317-7439 to learn about the program and about filing a claim. There is a time limit to file a claim for compensation. 7. How can I learn more?  Ask your health care provider.  Call your local or state health department.  Contact the Centers for Disease Control and Prevention (CDC): ? Call (838)547-6866 (1-800-CDC-INFO) or ? Visit CDC's website at [email or web address].uy Vaccine Information Statement Tdap (Tetanus, Diphtheria, Pertussis) Vaccine (08/16/2018) This information is not intended to replace advice given to you by your health care provider. Make sure you discuss any questions you have with your health care provider. Document Revised: 08/25/2018 Document Reviewed: 08/28/2018 Elsevier Patient Education  2020 [employer]. https://www.cdc.gov/vaccines/hcp/vis/vis-statements/tdap.pdf">  Tdap (Tetanus, Diphtheria, Pertussis) Vaccine: What You Need to Know 1. Why get vaccinated? Tdap vaccine can prevent tetanus, diphtheria, and pertussis. Diphtheria and pertussis spread from person to person. Tetanus enters the body through cuts or wounds.  TETANUS (T) causes painful stiffening of the muscles. Tetanus can lead to serious health problems, including being unable to open the mouth, having trouble swallowing and breathing, or death.  DIPHTHERIA (D) can lead to difficulty breathing, heart failure, paralysis, or death.  PERTUSSIS (aP), also known as "whooping cough," can cause uncontrollable, violent coughing which makes it hard to breathe, eat, or drink. Pertussis can be extremely serious in babies and young children, causing pneumonia, convulsions, brain  damage, or death. In teens and adults, it can cause weight loss, loss of bladder control, passing out, and rib fractures from severe coughing. 2. Tdap vaccine Tdap is only for children 7 years and older, adolescents, and adults.  Adolescents should receive a single dose of Tdap, preferably at age 70 or 12 years. Pregnant women should get a dose of Tdap during every pregnancy, to protect the newborn from pertussis. Infants are most at risk for severe, life-threatening complications from pertussis. Adults who have never received Tdap should get a dose of Tdap. Also, adults should receive a booster dose every 10 years, or earlier in the case of a severe and dirty wound or burn. Booster doses can be either Tdap or Td (a different vaccine that protects against tetanus and diphtheria but not pertussis). Tdap may be given at the same time as other vaccines. 3. Talk with your health care provider Tell your vaccine provider if the person getting the vaccine:  Has had an allergic reaction after a previous dose of any vaccine that protects against tetanus, diphtheria, or pertussis, or has any severe, life-threatening allergies.  Has had a coma, decreased level of consciousness, or prolonged seizures within 7 days after a previous dose of any pertussis vaccine (DTP, DTaP, or Tdap).  Has seizures or another nervous system problem.  Has ever had Guillain-Barr Syndrome (also called GBS).  Has had severe pain or swelling after a previous dose of any vaccine that protects against tetanus or diphtheria. In some cases, your health care provider may decide to postpone Tdap vaccination to a future visit.  People with minor illnesses, such as a cold, may be vaccinated. People who are moderately or severely ill should usually wait until  they recover before getting Tdap vaccine.  Your health care provider can give you more information. 4. Risks of a vaccine reaction  Pain, redness, or swelling where the shot was  given, mild fever, headache, feeling tired, and nausea, vomiting, diarrhea, or stomachache sometimes happen after Tdap vaccine. People sometimes faint after medical procedures, including vaccination. Tell your provider if you feel dizzy or have vision changes or ringing in the ears.  As with any medicine, there is a very remote chance of a vaccine causing a severe allergic reaction, other serious injury, or death. 5. What if there is a serious problem? An allergic reaction could occur after the vaccinated person leaves the clinic. If you see signs of a severe allergic reaction (hives, swelling of the face and throat, difficulty breathing, a fast heartbeat, dizziness, or weakness), call 9-1-1 and get the person to the nearest hospital. For other signs that concern you, call your health care provider.  Adverse reactions should be reported to the Vaccine Adverse Event Reporting System (VAERS). Your health care provider will usually file this report, or you can do it yourself. Visit the VAERS website at www.vaers.LAgents.no or call 818-534-9116. VAERS is only for reporting reactions, and VAERS staff do not give medical advice. 6. The National Vaccine Injury Compensation Program The [employer] Vaccine Injury Compensation Program (VICP) is a federal program that was created to compensate people who may have been injured by certain vaccines. Visit the VICP website at SpiritualWord.at or call (479)612-3435 to learn about the program and about filing a claim. There is a time limit to file a claim for compensation. 7. How can I learn more?  Ask your health care provider.  Call your local or state health department.  Contact the Centers for Disease Control and Prevention (CDC): ? Call 3671473397 (1-800-CDC-INFO) or ? Visit CDC's website at [email or web address] Vaccine Information Statement Tdap (Tetanus, Diphtheria, Pertussis) Vaccine (08/16/2018) This information is not intended to replace  advice given to you by your health care provider. Make sure you discuss any questions you have with your health care provider. Document Revised: 08/25/2018 Document Reviewed: 08/28/2018 Elsevier Patient Education  2020 [employer].

## 2020-01-23 ENCOUNTER — Ambulatory Visit: Payer: Self-pay

## 2020-01-23 NOTE — Telephone Encounter (Signed)
Call patient in regards to reaction from vaccination given in office Tdap.  Information given to take Benadryl was correct.  However patient was very upset that she had been calling since the beginning of the day when the office and the call was returned at 4:35 PM.  Expressed to her message was received 30 minutes prior and seen patient at that time and apologize.  Explained 911 if any facial swelling around lips tongue mouth face.  If local reaction continue with 25 mg of Benadryl every 6 hours as needed.

## 2020-01-23 NOTE — Telephone Encounter (Signed)
Please advise 

## 2020-01-23 NOTE — Telephone Encounter (Signed)
Pt. States she was in the practice yesterday and received a tetanus vaccine.  4 hours later broke out in hives "everywhere." Large raised "whelps." No availability in the office. Pt. Requests to be worked in. Please advise.  Reason for Disposition . [1] Redness or red streak around the injection site AND [2] begins > 48 hours after shot AND [3] fever  Answer Assessment - Initial Assessment Questions 1. SYMPTOMS: "What is the main symptom?" (e.g., redness, swelling, pain)      Hives all over 2. ONSET: "When was the vaccine (shot) given?" "How much later did the hives begin?" (e.g., hours, days ago)      2:00 yesterday   6-7 p.m. rash started 3. SEVERITY: "How bad is it?"      Hives all over - whelps 4. FEVER: "Is there a fever?" If Yes, ask: "What is it, how was it measured, and when did it start?"      No 5. IMMUNIZATIONS GIVEN: "What shots have you recently received?"     Tetanus 6. PAST REACTIONS: "Have you reacted to immunizations before?" If Yes, ask: "What happened?"     No 7. OTHER SYMPTOMS: "Do you have any other symptoms?"     No  Protocols used: IMMUNIZATION REACTIONS-A-AH

## 2020-02-13 ENCOUNTER — Encounter: Payer: Self-pay | Admitting: General Practice

## 2020-02-13 ENCOUNTER — Other Ambulatory Visit: Payer: Self-pay

## 2020-02-13 ENCOUNTER — Ambulatory Visit (INDEPENDENT_AMBULATORY_CARE_PROVIDER_SITE_OTHER): Payer: Medicaid Other | Admitting: Obstetrics & Gynecology

## 2020-02-13 ENCOUNTER — Encounter: Payer: Self-pay | Admitting: Obstetrics & Gynecology

## 2020-02-13 VITALS — BP 115/76 | HR 88 | Temp 98.4°F | Ht 69.0 in | Wt 230.4 lb

## 2020-02-13 DIAGNOSIS — Z30432 Encounter for removal of intrauterine contraceptive device: Secondary | ICD-10-CM

## 2020-02-13 DIAGNOSIS — Z3009 Encounter for other general counseling and advice on contraception: Secondary | ICD-10-CM | POA: Diagnosis not present

## 2020-02-13 NOTE — Patient Instructions (Signed)

## 2020-02-13 NOTE — Progress Notes (Signed)
GYNECOLOGY OFFICE VISIT NOTE  History:   Andrea Logan is a 34 y.o. [id number] here today for IUD removal and consultation about tubal ligation.  Reports having issues with vaginitis and other symptoms with her IUD that has been in place for about two years. Desires removal today, refuses to keep this in place until her tubal ligation. Wants permanent sterilization with tubal ligation.  Not sexually active currently. She denies any current abnormal vaginal discharge, bleeding, pelvic pain or other concerns.    Past Medical History:  Diagnosis Date  . Anemia   . Migraine     Past Surgical History:  Procedure Laterality Date  . CERVICAL CERCLAGE    . CERVICAL CONE BIOPSY      The following portions of the patient's history were reviewed and updated as appropriate: allergies, current medications, past family history, past medical history, past social history, past surgical history and problem list.   Health Maintenance:  Normal pap and negative HRHPV on 05/31/2019.  Review of Systems:  Pertinent items noted in HPI and remainder of comprehensive ROS otherwise negative.  Physical Exam:  BP 115/76 (BP Location: Left Arm, Patient Position: Sitting, Cuff Size: Large)   Pulse 88   Temp 98.4 F (36.9 C) (Oral)   Ht 5\' 9"  (1.753 m)   Wt 230 lb 6.4 oz (104.5 kg)   BMI 34.02 kg/m  CONSTITUTIONAL: Well-developed, well-nourished female in no acute distress.  HEENT:  Normocephalic, atraumatic. External right and left ear normal. No scleral icterus.  NECK: Normal range of motion, supple, no masses noted on observation SKIN: No rash noted. Not diaphoretic. No erythema. No pallor. MUSCULOSKELETAL: Normal range of motion. No edema noted. NEUROLOGIC: Alert and oriented to person, place, and time. Normal muscle tone coordination. No cranial nerve deficit noted. PSYCHIATRIC: Normal mood and affect. Normal behavior. Normal judgment and thought content. CARDIOVASCULAR: Normal heart rate  noted RESPIRATORY: Effort and breath sounds normal, no problems with respiration noted ABDOMEN: No masses noted. No other overt distention noted.   PELVIC: Normal appearing external genitalia; normal urethral meatus; normal appearing vaginal mucosa and cervix.  No abnormal discharge noted. IUD strings seen.   Performed in the presence of a chaperone  IUD Removal  Patient identified, informed consent performed, consent signed.  Patient was in the dorsal lithotomy position, normal external genitalia was noted.  A speculum was placed in the patient's vagina, normal discharge was noted, no lesions. The cervix was visualized, no lesions, no abnormal discharge.  The strings of the IUD were grasped and pulled using ring forceps. The IUD was removed in its entirety.  Patient tolerated the procedure well.        Assessment and Plan:      1. Consultation for female sterilization 2. Encounter for IUD removal IUD removed as per patient's preference. Currently not sexually active, plans condoms if she becomes active.  Patient desires permanent sterilization.  Declines all other reversible forms of contraception (over the counter/barrier methods; hormonal contraceptives including pill, patch, ring, Depo-Provera injection, Nexplanon implant; hormonal IUDs Skyla and Mirena; nonhormonal copper IUD Paragard) were discussed with patient; she declined all these modalities.  Discussed laparoscopic bilateral tubal sterilization using Filshie clips. For the Filshie clip sterilization, she was told she will have one incision in her umbilicus.  Failure risk of 1-2 % with increased risk of ectopic gestation if pregnancy occurs was also discussed with patient.  Reiterated permanence and irreversibility, attempts to reverse tubal sterilization are often not successful.  Also emphasized  increased risk of regret which is noted more in patients less than the age of 19.  All questions were answered. She still desires  laparoscopic bilateral salpingectomy.  Other risks of the procedure were discussed with patient including but not limited to: bleeding, infection, injury to surrounding organs and need for additional procedures.  Also discussed possibility of post-tubal pain syndrome. Patient verbalized understanding of these risks and wants to proceed with this procedure.  She was told that she will be contacted by our surgical scheduler regarding the time and date of her surgery.  She was told she will be called for a preoperative appointment about a week prior to surgery and will be given further preoperative instructions at that visit. Printed patient education handouts about the procedure were given to the patient to review at home. Medicaid papers were signed today.  Routine preventative health maintenance measures emphasized. Please refer to After Visit Summary for other counseling recommendations.   Return for any gynecologic concerns.    Total face-to-face time with patient: 30 minutes.  Over 50% of encounter was spent on counseling and coordination of care.   Jaynie Collins, MD, FACOG Obstetrician & Gynecologist, [facility] for [employer], [facility]

## 2020-04-05 ENCOUNTER — Inpatient Hospital Stay (HOSPITAL_COMMUNITY): Admission: RE | Admit: 2020-04-05 | Payer: Medicaid Other | Source: Ambulatory Visit

## 2020-04-09 ENCOUNTER — Encounter (HOSPITAL_BASED_OUTPATIENT_CLINIC_OR_DEPARTMENT_OTHER): Admission: RE | Payer: Self-pay | Source: Home / Self Care

## 2020-04-09 ENCOUNTER — Ambulatory Visit (HOSPITAL_BASED_OUTPATIENT_CLINIC_OR_DEPARTMENT_OTHER)
Admission: RE | Admit: 2020-04-09 | Payer: Medicaid Other | Source: Home / Self Care | Admitting: Obstetrics & Gynecology

## 2020-04-09 SURGERY — LIGATION, FALLOPIAN TUBE, LAPAROSCOPIC
Anesthesia: Choice | Laterality: Bilateral

## 2020-05-17 NOTE — L&D Delivery Note (Addendum)
OB/GYN Faculty Practice Delivery Note  Andrea Logan is a 35 y.o. [id number] s/p SVD at [redacted]w[redacted]d. She was admitted for SROM.   ROM: 16h 27m with clear fluid GBS Status: positive Maximum Maternal Temperature: 98.7  Labor Progress: Presented with SROM of Twin A and was expectantly managed and progressed to complete.   Delivery Date/Time: [phone] on 10/31 Delivery: Called to room and patient was complete and pushing. Head delivered OA. No nuchal cord present. Shoulder and body delivered in usual fashion. Infant with spontaneous cry, placed on mother's abdomen, dried and stimulated. Cord clamped x 2 after 1-minute delay, and cut by Father of Baby. Cord blood drawn. Placenta was clamped with kelly clamp.  At that time baby was found to have poor tone and was taken to warmer and the NICU team was called. This ultimately resulted in a code APGAR with baby needing significant rescucitation, subsequently had good APGARs and was able to stay with mother postpartum.  The OB team diverted attention to delivery of Twin B. Twin B was found to be transverse on bedside US. An attempt was made by Dr. Crissie Reese to grab the fetal feet but he was unsuccessful. Dr. Charlotta Newton was present in room and gloved, she performed AROM and was able to deliver one leg, however the second leg was straight and pointed towards the uterine fundus. Additionally there was a significant amount of bleeding at the time of AROM. After a short attempt to extract the second fetal leg of Twin B and confirmation was obtained that FHR was in the 60's a code cesarean was called and patient was taken to the OR immediately. See Op note for details of CS and delivery of Twin B  Placenta: intact, delivered in OR Complications: STAT CS called for Twin B Lacerations: None Analgesia:  Epidural   Infant: female  APGARs 1,9  2950g  Warner Mccreedy, MD, MPH OB Fellow, [facility] for [facility], [facility] 03/16/2021, 11:18  AM     Attestation of Attending Supervision of Obstetric Fellow: Evaluation and management procedures were performed by the Obstetric Fellow under my supervision and collaboration.  I have reviewed the Obstetric Fellow's note and chart, and I agree with the management and plan. I have also made any necessary editorial changes.   Venora Maples, MD Family Medicine Attending, [facility] for [facility], [facility] 03/16/2021 2:49 PM

## 2020-08-19 ENCOUNTER — Other Ambulatory Visit: Payer: Self-pay

## 2020-08-19 ENCOUNTER — Encounter (INDEPENDENT_AMBULATORY_CARE_PROVIDER_SITE_OTHER): Payer: Self-pay | Admitting: Primary Care

## 2020-08-19 ENCOUNTER — Ambulatory Visit (INDEPENDENT_AMBULATORY_CARE_PROVIDER_SITE_OTHER): Payer: Medicaid Other | Admitting: Primary Care

## 2020-08-19 VITALS — BP 111/75 | HR 85 | Temp 97.5°F | Ht 69.0 in | Wt 240.0 lb

## 2020-08-19 DIAGNOSIS — F411 Generalized anxiety disorder: Secondary | ICD-10-CM

## 2020-08-19 DIAGNOSIS — N912 Amenorrhea, unspecified: Secondary | ICD-10-CM | POA: Diagnosis not present

## 2020-08-19 DIAGNOSIS — Z3201 Encounter for pregnancy test, result positive: Secondary | ICD-10-CM

## 2020-08-19 DIAGNOSIS — F321 Major depressive disorder, single episode, moderate: Secondary | ICD-10-CM

## 2020-08-19 LAB — POCT URINE PREGNANCY: Preg Test, Ur: POSITIVE — AB

## 2020-08-19 NOTE — Progress Notes (Signed)
Established Patient Office Visit  Subjective:  Patient ID: [name], female    DOB: Oct 10, 1985  Age: 35 y.o. MRN: [id number]  CC:  Chief Complaint  Patient presents with  . Amenorrhea    HPI [name] is a 35 year female who presents for presents for amenorrhea. Patient's last menstrual period was 06/28/2020 (exact date). She has been experiencing nausea and fatigue. 02/05/20 she had her IUD remove and intended to have a tubal ligation. However, her pregnancy test is positive.  Past Medical History:  Diagnosis Date  . Anemia   . Migraine     Past Surgical History:  Procedure Laterality Date  . CERVICAL CERCLAGE    . CERVICAL CONE BIOPSY      Family History  Problem Relation Age of Onset  . Hypertension Mother   . Diabetes Mother   . Dementia Father     Social History   Socioeconomic History  . Marital status: Unknown    Spouse name: Not on file  . Number of children: Not on file  . Years of education: Not on file  . Highest education level: Not on file  Occupational History  . Not on file  Tobacco Use  . Smoking status: Never Smoker  . Smokeless tobacco: Never Used  Vaping Use  . Vaping Use: Never used  Substance and Sexual Activity  . Alcohol use: No    Alcohol/week: 0.0 standard drinks  . Drug use: No  . Sexual activity: Yes    Birth control/protection: I.U.D.  Other Topics Concern  . Not on file  Social History Narrative  . Not on file   Social Determinants of Health   Financial Resource Strain: Not on file  Food Insecurity: Not on file  Transportation Needs: Not on file  Physical Activity: Not on file  Stress: Not on file  Social Connections: Not on file  Intimate Partner Violence: Not on file    No outpatient medications prior to visit.   No facility-administered medications prior to visit.    No Known Allergies  ROS Review of Systems  Constitutional: Positive for fatigue.  Gastrointestinal: Positive for nausea.   Genitourinary: Positive for menstrual problem.  All other systems reviewed and are negative.     Objective:    Physical Exam Vitals reviewed.  Constitutional:      Appearance: She is obese.  HENT:     Head: Normocephalic.     Right Ear: External ear normal.     Left Ear: External ear normal.     Nose: Nose normal.  Eyes:     Extraocular Movements: Extraocular movements intact.  Cardiovascular:     Rate and Rhythm: Normal rate and regular rhythm.  Pulmonary:     Effort: Pulmonary effort is normal.     Breath sounds: Normal breath sounds.  Abdominal:     General: There is distension.     Palpations: Abdomen is soft.  Musculoskeletal:        General: Normal range of motion.     Cervical back: Normal range of motion and neck supple.  Skin:    General: Skin is warm and dry.  Neurological:     Mental Status: She is alert and oriented to person, place, and time.  Psychiatric:        Mood and Affect: Mood normal.        Behavior: Behavior normal.        Thought Content: Thought content normal.  Judgment: Judgment normal.     BP 111/75 (BP Location: Right Arm, Patient Position: Sitting, Cuff Size: Large)   Pulse 85   Temp (!) 97.5 F (36.4 C) (Temporal)   Ht 5\' 9"  (1.753 m)   Wt 240 lb (108.9 kg)   LMP 06/28/2020 (Exact Date)   SpO2 96%   BMI 35.44 kg/m  Wt Readings from Last 3 Encounters:  08/19/20 240 lb (108.9 kg)  02/13/20 230 lb 6.4 oz (104.5 kg)  01/22/20 232 lb (105.2 kg)     Health Maintenance Due  Topic Date Due  . COVID-19 Vaccine (1) Never done    There are no preventive care reminders to display for this patient.  Lab Results  Component Value Date   TSH 0.944 02/06/2015   Lab Results  Component Value Date   WBC 4.3 02/06/2015   HGB 9.7 (L) 02/06/2015   HCT 31.5 (L) 02/06/2015   MCV 69.5 (L) 02/06/2015   PLT 358 02/06/2015   Lab Results  Component Value Date   NA 139 02/06/2015   K 4.1 02/06/2015   CO2 21 02/06/2015   GLUCOSE  73 02/06/2015   BUN 11 02/06/2015   CREATININE 0.90 02/06/2015   BILITOT 0.3 02/06/2015   ALKPHOS 40 02/06/2015   AST 16 02/06/2015   ALT 16 02/06/2015   PROT 7.2 02/06/2015   ALBUMIN 4.1 02/06/2015   CALCIUM 9.2 02/06/2015   Lab Results  Component Value Date   CHOL 154 02/06/2015   Lab Results  Component Value Date   HDL 51 02/06/2015   No results found for: [facility] Lab Results  Component Value Date   TRIG 78 02/06/2015   No results found for: CHOLHDL No results found for: 02/08/2015    Assessment & Plan:  [name] was seen today for amenorrhea.  Diagnoses and all orders for this visit:  Amenorrhea -     POCT urine pregnancy- positive  -     Ambulatory referral to Obstetrics / Gynecology  Positive pregnancy test -     Ambulatory referral to Obstetrics / Gynecology Patient was referred to [facility] and appointment made  GAD (generalized anxiety disorder) Antidepressants cross the placenta and fetal blood brain barrier. Prenatal exposure thus involves potential risks of teratogenesis, preterm birth, low birth weight, and pregnancy complications (eg, spontaneous abortion and postpartum hemorrhage), as well as postnatal effects ( up to date) safest is Prozac discussed risked will re-evaluate after OBGYN visit and recommendation. Therapy can be effective if patient is responsive and agreement to treatment.  Flowsheet Row Office Visit from 08/19/2020 in [facility]  PHQ-9 Total Score 18    At this time with a positive pregnancy test . Counsel/discussion given      Follow-up: Return for after pregnancy.    [facility], NP.

## 2020-08-23 NOTE — Progress Notes (Incomplete)
Established Patient Office Visit  Subjective:  Patient ID: Andrea Logan, female    DOB: 1986-01-11  Age: 35 y.o. MRN: [id number]  CC:  Chief Complaint  Patient presents with  . Amenorrhea    HPI [name] is a 35 year female who presents for presents for amenorrhea. Patient's last menstrual period was 06/28/2020 (exact date). She has been experiencing nausea and fatigue. 02/05/20 she had her IUD remove and intended to have a tubal ligation. However, her pregnancy test is positive.  Past Medical History:  Diagnosis Date  . Anemia   . Migraine     Past Surgical History:  Procedure Laterality Date  . CERVICAL CERCLAGE    . CERVICAL CONE BIOPSY      Family History  Problem Relation Age of Onset  . Hypertension Mother   . Diabetes Mother   . Dementia Father     Social History   Socioeconomic History  . Marital status: Unknown    Spouse name: Not on file  . Number of children: Not on file  . Years of education: Not on file  . Highest education level: Not on file  Occupational History  . Not on file  Tobacco Use  . Smoking status: Never Smoker  . Smokeless tobacco: Never Used  Vaping Use  . Vaping Use: Never used  Substance and Sexual Activity  . Alcohol use: No    Alcohol/week: 0.0 standard drinks  . Drug use: No  . Sexual activity: Yes    Birth control/protection: I.U.D.  Other Topics Concern  . Not on file  Social History Narrative  . Not on file   Social Determinants of Health   Financial Resource Strain: Not on file  Food Insecurity: Not on file  Transportation Needs: Not on file  Physical Activity: Not on file  Stress: Not on file  Social Connections: Not on file  Intimate Partner Violence: Not on file    No outpatient medications prior to visit.   No facility-administered medications prior to visit.    No Known Allergies  ROS Review of Systems  Constitutional: Positive for fatigue.  Gastrointestinal: Positive for nausea.   Genitourinary: Positive for menstrual problem.  All other systems reviewed and are negative.     Objective:    Physical Exam Vitals reviewed.  Constitutional:      Appearance: She is obese.  HENT:     Head: Normocephalic.     Right Ear: External ear normal.     Left Ear: External ear normal.     Nose: Nose normal.  Eyes:     Extraocular Movements: Extraocular movements intact.  Cardiovascular:     Rate and Rhythm: Normal rate and regular rhythm.  Pulmonary:     Effort: Pulmonary effort is normal.     Breath sounds: Normal breath sounds.  Abdominal:     General: There is distension.     Palpations: Abdomen is soft.  Musculoskeletal:        General: Normal range of motion.     Cervical back: Normal range of motion and neck supple.  Skin:    General: Skin is warm and dry.  Neurological:     Mental Status: She is alert and oriented to person, place, and time.  Psychiatric:        Mood and Affect: Mood normal.        Behavior: Behavior normal.        Thought Content: Thought content normal.  Judgment: Judgment normal.     BP 111/75 (BP Location: Right Arm, Patient Position: Sitting, Cuff Size: Large)   Pulse 85   Temp (!) 97.5 F (36.4 C) (Temporal)   Ht 5\' 9"  (1.753 m)   Wt 240 lb (108.9 kg)   LMP 06/28/2020 (Exact Date)   SpO2 96%   BMI 35.44 kg/m  Wt Readings from Last 3 Encounters:  08/19/20 240 lb (108.9 kg)  02/13/20 230 lb 6.4 oz (104.5 kg)  01/22/20 232 lb (105.2 kg)     Health Maintenance Due  Topic Date Due  . COVID-19 Vaccine (1) Never done    There are no preventive care reminders to display for this patient.  Lab Results  Component Value Date   TSH 0.944 02/06/2015   Lab Results  Component Value Date   WBC 4.3 02/06/2015   HGB 9.7 (L) 02/06/2015   HCT 31.5 (L) 02/06/2015   MCV 69.5 (L) 02/06/2015   PLT 358 02/06/2015   Lab Results  Component Value Date   NA 139 02/06/2015   K 4.1 02/06/2015   CO2 21 02/06/2015   GLUCOSE  73 02/06/2015   BUN 11 02/06/2015   CREATININE 0.90 02/06/2015   BILITOT 0.3 02/06/2015   ALKPHOS 40 02/06/2015   AST 16 02/06/2015   ALT 16 02/06/2015   PROT 7.2 02/06/2015   ALBUMIN 4.1 02/06/2015   CALCIUM 9.2 02/06/2015   Lab Results  Component Value Date   CHOL 154 02/06/2015   Lab Results  Component Value Date   HDL 51 02/06/2015   No results found for: [facility] Lab Results  Component Value Date   TRIG 78 02/06/2015   No results found for: CHOLHDL No results found for: 02/08/2015    Assessment & Plan:  Andrea Logan was seen today for amenorrhea.  Diagnoses and all orders for this visit:  Amenorrhea -     POCT urine pregnancy- positive  -     Ambulatory referral to Obstetrics / Gynecology  Positive pregnancy test -     Ambulatory referral to Obstetrics / Gynecology  GAD (generalized anxiety disorder) Antidepressants cross the placenta and fetal blood brain barrier. Prenatal exposure thus involves potential risks of teratogenesis, preterm birth, low birth weight, and pregnancy complications (eg, spontaneous abortion and postpartum hemorrhage), as well as postnatal effects ( up to date) safest is Prozac discussed risked will re-evaluate after OBGYN visit and recommendation. Therapy can be effective if patient is responsive and agreement to treatment.  Flowsheet Row Office Visit from 08/19/2020 in [facility]  PHQ-9 Total Score 18    At this time with a positive pregnancy test       Follow-up: No follow-ups on file.    [facility], NP.mepre

## 2020-08-24 DIAGNOSIS — F32A Depression, unspecified: Secondary | ICD-10-CM

## 2020-08-24 HISTORY — DX: Depression, unspecified: F32.A

## 2020-08-28 ENCOUNTER — Telehealth: Payer: Self-pay

## 2020-08-28 NOTE — Telephone Encounter (Signed)
Patient is aware to discuss with OB at her appt on 4/18.

## 2020-08-28 NOTE — Telephone Encounter (Signed)
Copied from CRM [id number]. Topic: General - Other >> Aug 28, 2020  9:18 AM Andrea Logan wrote: Reason for CRM: Patient called in say that she just found out that she is expecting and need to know how soon to start on prenatal vitamins. Please call Ph# [phone] (

## 2020-09-01 ENCOUNTER — Telehealth (INDEPENDENT_AMBULATORY_CARE_PROVIDER_SITE_OTHER): Payer: Medicaid Other | Admitting: *Deleted

## 2020-09-01 ENCOUNTER — Other Ambulatory Visit: Payer: Self-pay

## 2020-09-01 VITALS — Wt 240.0 lb

## 2020-09-01 DIAGNOSIS — O09521 Supervision of elderly multigravida, first trimester: Secondary | ICD-10-CM

## 2020-09-01 DIAGNOSIS — O0990 Supervision of high risk pregnancy, unspecified, unspecified trimester: Secondary | ICD-10-CM | POA: Insufficient documentation

## 2020-09-01 MED ORDER — GOJJI WEIGHT SCALE MISC: 1.0000 | Freq: Every day | 0 refills | Status: DC | PRN

## 2020-09-01 MED ORDER — PRENATAL 27-1 MG PO TABS: 1.0000 | ORAL_TABLET | Freq: Every day | ORAL | 12 refills | Status: DC

## 2020-09-01 MED ORDER — BLOOD PRESSURE MONITOR AUTOMAT DEVI: 1.0000 | Freq: Every day | 0 refills | Status: DC

## 2020-09-01 NOTE — Progress Notes (Addendum)
  Virtual Visit via Telephone Note  I connected with Andrea Logan on 09/01/20 at  8:30 AM EDT by telephone and verified that I am speaking with the correct person using two identifiers.  Location: Patient: Home Provider: [facility]   I discussed the limitations, risks, security and privacy concerns of performing an evaluation and management service by telephone and the availability of in person appointments. I also discussed with the patient that there may be a patient responsible charge related to this service. The patient expressed understanding and agreed to proceed.   History of Present Illness: PRENATAL INTAKE SUMMARY  Andrea Logan presents today New OB Nurse Interview.  OB History    Gravida  5   Para  3   Term  3   Preterm      AB  1   Living  3     SAB  1   IAB      Ectopic      Multiple      Live Births  3          I have reviewed the patient's medical, obstetrical, social, and family histories, medications, and available lab results.  SUBJECTIVE She has no unusual complaints. Patient reported having ultrasound completed at [facility] on 08/22/20 and carrying twins with gestational age [redacted]w[redacted]d. Reported short cervix with history of cerclage.   Observations/Objective: Initial nurse interview for history/labs (New OB)  EDD: 04/04/2021 by LMP GA: [redacted]w[redacted]d [id number] FHT: non face to face interview  GENERAL APPEARANCE: non face to face interview  Assessment and Plan: Normal pregnancy Prenatal care: [facility] Labs to be completed at next visit with Andrea Logan on Sep 23, 2020 at [facility]. Rx for BP monitor and weight scale sent to [facility] Patient to sign up for [facility] Rx for PNV sent to pharmacy Advised to bring ultrasound report to clinic to scan in record.   Follow Up Instructions:   I discussed the assessment and treatment plan with the patient. The patient was provided an opportunity to ask questions and all were answered. The  patient agreed with the plan and demonstrated an understanding of the instructions.   The patient was advised to call back or seek an in-person evaluation if the symptoms worsen or if the condition fails to improve as anticipated.  I provided 35 minutes of non-face-to-face time during this encounter.   Clovis Pu, RN

## 2020-09-02 ENCOUNTER — Telehealth: Payer: Medicaid Other

## 2020-09-05 ENCOUNTER — Ambulatory Visit (INDEPENDENT_AMBULATORY_CARE_PROVIDER_SITE_OTHER): Payer: Self-pay | Admitting: *Deleted

## 2020-09-05 NOTE — Telephone Encounter (Signed)
Per initial encounter, "Pt is requesting a call back from NT. Pt says that she is pregnant and is experiencing some yellow discharge. Assisted pt by scheduled with PCP next available. Also suggested a OBGYN to pt due to her expecting. Pt says that she is pregnant with twins and would like to just discuss the discharge with a nurse. Pt says that she will also be seeing a OBGYN"; contacted pt to discuss her symptoms; the pt says she is [redacted] weeks pregnant;Her discharge started PM 09/03/20  she is also having lower back pain as discussed with OB-GYN on 09/01/20; recommendations made per nurse triage protocol; the pt is seen by Gwinda Passe, [facility]; spoke with Malachi Bonds and she says the pt is seen at [facility] for [facility] [phone]; Malachi Bonds states this office should have an on-call provider and if the pt is unable to contact this provider, she should be evaluated at the [facility]; pt given information and contact number for the GYN office; the pt verbalized understanding.   Reason for Disposition . Bad smelling vaginal discharge  Answer Assessment - Initial Assessment Questions 1. DISCHARGE: "Describe the discharge." (e.g., white, yellow, green, gray, foamy, cottage cheese-like)     Yellowish-green 2. ODOR: "Is there a bad odor?"     yes 3. ONSET: "When did the discharge begin?"    PM 09/03/20 4. RASH: "Is there a rash in that area?" If Yes, ask: "Describe it." (e.g., redness, blisters, sores, bumps)    no 5. ABDOMINAL PAIN: "Are you having any abdominal pain?" If Yes, ask: "What does it feel like? " (e.g., crampy, dull, intermittent, constant)     no 6. ABDOMINAL PAIN SEVERITY: If present, ask: "How bad is it?"  (e.g., mild, moderate, severe)  - MILD - doesn't interfere with normal activities   - MODERATE - interferes with normal activities or awakens from sleep   - SEVERE - patient doesn't want to move (R/O peritonitis)      no 7. CAUSE: "What do you think is causing the  discharge?" "Have you had the same problem before? What happened then?"     Not sure; previously treated for trich & BV 8. OTHER SYMPTOMS: "Do you have any other symptoms?" (e.g., fever, itching, vaginal bleeding, pain with urination, injury to genital area, vaginal foreign body)    itching 9. PREGNANCY: "Is there any chance you are pregnant?" "When was your last menstrual period?"     [redacted] weeks pregnant  Protocols used: VAGINAL DISCHARGE-A-AH

## 2020-09-08 ENCOUNTER — Other Ambulatory Visit (HOSPITAL_COMMUNITY)
Admission: RE | Admit: 2020-09-08 | Discharge: 2020-09-08 | Disposition: A | Discharge status: Home / Self Care | Payer: Medicaid Other | Source: Ambulatory Visit | Attending: Obstetrics and Gynecology | Admitting: Obstetrics and Gynecology

## 2020-09-08 ENCOUNTER — Other Ambulatory Visit: Payer: Self-pay

## 2020-09-08 ENCOUNTER — Ambulatory Visit (INDEPENDENT_AMBULATORY_CARE_PROVIDER_SITE_OTHER): Payer: Medicaid Other | Admitting: *Deleted

## 2020-09-08 VITALS — BP 110/66 | HR 83 | Temp 97.6°F | Ht 69.0 in | Wt 239.2 lb

## 2020-09-08 DIAGNOSIS — O26899 Other specified pregnancy related conditions, unspecified trimester: Secondary | ICD-10-CM | POA: Insufficient documentation

## 2020-09-08 DIAGNOSIS — N898 Other specified noninflammatory disorders of vagina: Secondary | ICD-10-CM

## 2020-09-08 NOTE — Progress Notes (Signed)
   SUBJECTIVE:  35 y.o. female complains of green and yellow vaginal discharge for 5 day(s). Denies abnormal vaginal bleeding or significant pelvic pain or fever. No UTI symptoms. Denies history of known exposure to STD.  Patient's last menstrual period was 06/28/2020 (exact date).  OBJECTIVE:  She appears well, afebrile. Urine dipstick: not done.  ASSESSMENT:  Vaginal Discharge    PLAN:  GC, chlamydia, trichomonas, BVAG, CVAG probe sent to lab. Treatment: To be determined once lab results are received ROV prn if symptoms persist or worsen.  Clovis Pu, RN

## 2020-09-09 ENCOUNTER — Other Ambulatory Visit: Payer: Self-pay | Admitting: Obstetrics and Gynecology

## 2020-09-09 ENCOUNTER — Telehealth: Payer: Self-pay | Admitting: Primary Care

## 2020-09-09 ENCOUNTER — Other Ambulatory Visit: Payer: Self-pay | Admitting: *Deleted

## 2020-09-09 DIAGNOSIS — A5901 Trichomonal vulvovaginitis: Secondary | ICD-10-CM

## 2020-09-09 DIAGNOSIS — B9689 Other specified bacterial agents as the cause of diseases classified elsewhere: Secondary | ICD-10-CM

## 2020-09-09 DIAGNOSIS — N760 Acute vaginitis: Secondary | ICD-10-CM

## 2020-09-09 LAB — CERVICOVAGINAL ANCILLARY ONLY
Bacterial Vaginitis (gardnerella): POSITIVE — AB
Candida Glabrata: NEGATIVE
Candida Vaginitis: NEGATIVE
Chlamydia: NEGATIVE
Comment: NEGATIVE
Comment: NEGATIVE
Comment: NEGATIVE
Comment: NEGATIVE
Comment: NEGATIVE
Comment: NORMAL
Neisseria Gonorrhea: NEGATIVE
Trichomonas: POSITIVE — AB

## 2020-09-09 MED ORDER — METRONIDAZOLE 500 MG PO TABS: 500.0000 mg | ORAL_TABLET | Freq: Two times a day (BID) | ORAL | 0 refills | Status: DC

## 2020-09-09 NOTE — Telephone Encounter (Signed)
Please send this to the appropriate department. Per chart patient had a nurse visit with [facility] [place] please send tho that nurse.

## 2020-09-09 NOTE — Telephone Encounter (Signed)
Please see encounter for nurse visit

## 2020-09-09 NOTE — Progress Notes (Signed)
   S: Patient called requesting results from vaginal self-swab.    O: Need for treatment of Trich and BV.  A: Metronidazole 500 mg 1 tablet PO BID x 7 days sent to pharmacy  P: Patient to follow up in 1 month for re-screening.  MyChart message sent to patient regarding results. Patient will need to have partner tested/treated at [facility] dept or PCP.  Patient advised to abstain from sex for 7-14 days after treatment or when partner has been tested/treated.    Clovis Pu, RN

## 2020-09-09 NOTE — Progress Notes (Signed)
Rx for Trich & BV already sent on 09/09/20 -- no action required

## 2020-09-09 NOTE — Telephone Encounter (Signed)
Copied from CRM [id number]. Topic: Quick Communication - Rx Refill/Question >> Sep 09, 2020 10:34 AM Aretta Nip wrote: Medication: Pt had a nurse appt yesterday for a discharge and was told a script would be called in and at present has not been called in. Pls submit to   Preferred Pharmacy (with phone number or street name):[id number] - [place], [place] - [address]  [place] [id number] Phone: [phone] Fax: [phone]  Pls FU with pt if not going to submit [phone]  Agent: Please be advised that RX refills may take up to 3 business days. We ask that you follow-up with your pharmacy.

## 2020-09-10 NOTE — Telephone Encounter (Signed)
Copied from CRM [id number]. Topic: General - Other >> Sep 10, 2020 10:25 AM Herby Abraham C wrote: Reason for CRM: pt called in for assistance. Pt says that she was prescribed a medication metroNIDAZOLE (FLAGYL) 500 MG. Pt says that she was told that medication causes birth defects. Pt would like to be advised further by provider.    Please assist pt further.

## 2020-09-10 NOTE — Telephone Encounter (Signed)
Called patient regarding pharmacist at [facility] is refusing to fill medication Metronidazole 500 mg 1 PO BID x 7 days. Pharmacist told patient that he will not fill the medication because it is contraindicated in first trimester of pregnancy. Nurse called to speak with pharmacist at [facility]. Pharmacist Ave Filter stated he was not going to fill the medication due to it be contraindicated. Nurse explained to [place] that metronidazole was a standard of care for our OB/Gyn patients regardless of trimester. He stated, that the medication will not be filled at that [facility], the prescription can be sent to a different pharmacy. Nurse Manveer Gomes called Vonzella Nipple, NP Director of [facility] clinic's and informed her what the situation occurred. Pt's name and DOB given to [place]. She was also given pharmacist name to call the pharmacy. Nurse called the patient back informed her what the pharmacist had said. Patient was then transferred to Raelyn Mora, CNM to discuss the risks/bennefits of taking Metronidazole during pregnancy.  Clovis Pu, RN

## 2020-09-11 MED ORDER — METRONIDAZOLE 500 MG PO TABS: 500.0000 mg | ORAL_TABLET | Freq: Two times a day (BID) | ORAL | 0 refills | Status: DC

## 2020-09-16 ENCOUNTER — Ambulatory Visit (INDEPENDENT_AMBULATORY_CARE_PROVIDER_SITE_OTHER): Payer: Medicaid Other | Admitting: Primary Care

## 2020-09-18 ENCOUNTER — Telehealth: Payer: Self-pay

## 2020-09-18 NOTE — Telephone Encounter (Signed)
Patient called stating that she has an appointment with our office next week for a NOB. Pt has hx of shortened cervix with cerclage. This pregnancy she is pregnant with twins and concerned because she is not scheduled for an ultrasound to check cervical length. Advised patient will send message to Dr. Debroah Loop to see if there is anything we need to do between now and her visit. Please advise.

## 2020-09-18 NOTE — Telephone Encounter (Signed)
LM with patient asking her to bring scan with her to appointment if available and also let her know that will schedule her for cerclage when she is here.

## 2020-09-23 ENCOUNTER — Ambulatory Visit (INDEPENDENT_AMBULATORY_CARE_PROVIDER_SITE_OTHER): Payer: Medicaid Other | Admitting: Obstetrics & Gynecology

## 2020-09-23 ENCOUNTER — Other Ambulatory Visit: Payer: Self-pay

## 2020-09-23 ENCOUNTER — Encounter: Payer: Self-pay | Admitting: Obstetrics & Gynecology

## 2020-09-23 VITALS — BP 111/76 | HR 90 | Wt 236.0 lb

## 2020-09-23 DIAGNOSIS — O0990 Supervision of high risk pregnancy, unspecified, unspecified trimester: Secondary | ICD-10-CM | POA: Diagnosis not present

## 2020-09-23 DIAGNOSIS — Z3A12 12 weeks gestation of pregnancy: Secondary | ICD-10-CM

## 2020-09-23 DIAGNOSIS — O30009 Twin pregnancy, unspecified number of placenta and unspecified number of amniotic sacs, unspecified trimester: Secondary | ICD-10-CM

## 2020-09-23 DIAGNOSIS — Z3481 Encounter for supervision of other normal pregnancy, first trimester: Secondary | ICD-10-CM

## 2020-09-23 DIAGNOSIS — O3440 Maternal care for other abnormalities of cervix, unspecified trimester: Secondary | ICD-10-CM

## 2020-09-23 DIAGNOSIS — D563 Thalassemia minor: Secondary | ICD-10-CM

## 2020-09-23 DIAGNOSIS — Z31430 Encounter of female for testing for genetic disease carrier status for procreative management: Secondary | ICD-10-CM | POA: Diagnosis not present

## 2020-09-23 DIAGNOSIS — Z9889 Other specified postprocedural states: Secondary | ICD-10-CM

## 2020-09-23 DIAGNOSIS — F32A Depression, unspecified: Secondary | ICD-10-CM

## 2020-09-23 DIAGNOSIS — O99340 Other mental disorders complicating pregnancy, unspecified trimester: Secondary | ICD-10-CM

## 2020-09-23 DIAGNOSIS — N883 Incompetence of cervix uteri: Secondary | ICD-10-CM

## 2020-09-23 NOTE — Patient Instructions (Signed)
Cervical Cerclage  Cervical cerclage is a surgical procedure to correct a cervix that opens up and thins out before pregnancy is at term. This is also called cervical insufficiency, or incompetent cervix. This condition can cause labor to start early (prematurely). In this procedure, a health care provider uses stitches (sutures) to sew the cervix shut during pregnancy. Your health care provider may use ultrasound to help guide the procedure and monitor your baby. Ultrasound uses sound waves to take images of your cervix and uterus. The health care provider will assess these images on a monitor in the operating room. Tell a health care provider about:  Any allergies you have, especially any allergies related to prescribed medicine, stitches, or anesthetic medicines.  All medicines you are taking, including vitamins, herbs, eye drops, creams, and over-the-counter medicines.  Your medical history, including prior labor deliveries.  Any problems you or family members have had with anesthetic medicines.  Any blood disorders you have.  Any surgeries you have had, including prior cervical stitching.  Any medical conditions you have or have had. What are the risks? Generally, this is a safe procedure. However, problems may occur, including:  Infection, such as infection of the cervix or the bag of fluid that surrounds the baby (amniotic sac).  Vaginal bleeding.  Allergic reactions to medicines.  Damage to nearby structures or organs, such as injury to the cervix or tearing of the amniotic sac.  Contractions that come too early, including going into early labor and delivery.  Cervical dystocia. This occurs when the cervix is unable to open normally during labor. What happens before the procedure? Staying hydrated Follow instructions from your health care provider about hydration, which may include:  Up to 2 hours before the procedure - you may continue to drink clear liquids, such as  water, clear fruit juice, black coffee, and plain tea.   Eating and drinking restrictions Follow instructions from your health care provider about eating and drinking, which may include:  8 hours before the procedure - stop eating heavy meals or foods, such as meat, fried foods, or fatty foods.  6 hours before the procedure - stop eating light meals or foods, such as toast or cereal.  6 hours before the procedure - stop drinking milk or drinks that contain milk.  2 hours before the procedure - stop drinking clear liquids. Medicines Ask your health care provider about:  Changing or stopping your regular medicines. This is especially important if you are taking diabetes medicines or blood thinners.  Taking medicines such as aspirin and ibuprofen. These medicines can thin your blood. Do not take these medicines unless your health care provider tells you to take them.  Taking over-the-counter medicines, vitamins, herbs, and supplements. Surgery safety Ask your health care provider:  How your surgery site will be marked.  What steps will be taken to help prevent infection. These may include: ? Removing hair at the surgery site. ? Washing skin with a germ-killing soap. ? Taking antibiotic medicine. General instructions  Do not put on any lotion, deodorant, or perfume.  Remove contact lenses and jewelry.  You may have an exam or testing, including blood or urine tests.  Plan to have someone take you home from the hospital or clinic.  If you will be going home right after the procedure, plan to have someone with you for 24 hours. What happens during the procedure?  An IV will be inserted into one of your veins.  You may be given  one or more of the following: ? A medicine to help you relax (sedative). ? A medicine to numb the area (local anesthetic). ? A medicine to make you fall asleep (general anesthetic). ? A medicine that is injected into your spine to numb the area below  and slightly above the injection site (spinal anesthetic).  A lubricated instrument (speculum) will be inserted into your vagina. The speculum will be widened to open the walls of your vagina so your surgeon can see your cervix.  Your cervix will be grasped and tightly sutured to close it. To do this, your surgeon will stitch a strong band of thread around your cervix, then the thread will be tightened to hold your cervix shut. The procedure may vary among health care providers and hospitals. What happens after the procedure?  Your blood pressure, heart rate, breathing rate, and blood oxygen level will be monitored until you leave the hospital or clinic.  You will be monitored for premature contractions.  You may have light bleeding and mild cramping.  You may have to wear compression stockings. These stockings help to prevent blood clots and reduce swelling in your legs.  If you were given a sedative during the procedure, it can affect you for several hours. Do not drive or operate machinery until your health care provider says that it is safe.  You may be put on bed rest.  You may be given an injection of a hormone (progesterone) to prevent premature contractions. Summary  Cervical cerclage is a surgical procedure in which stitches are used to sew the cervix shut during pregnancy.  Before the procedure, tell your health care provider about your medicines, or medical problems or blood disorders that you have.  This is a safe procedure. However, problems may occur, including infection, bleeding, or premature labor.  Follow all instructions about eating and drinking before the procedure. Plan to have someone drive you home from the hospital or clinic. This information is not intended to replace advice given to you by your health care provider. Make sure you discuss any questions you have with your health care provider. Document Revised: 02/27/2019 Document Reviewed: 12/27/2018 Elsevier  Patient Education  2021 [employer].

## 2020-09-23 NOTE — Progress Notes (Signed)
  Subjective:    Andrea Logan is a [id number] [redacted]w[redacted]d being seen today for her first obstetrical visit.  Her obstetrical history is significant for incompetent cervix. Patient does intend to breast feed. Pregnancy history fully reviewed. Twin pregnancy Patient reports nausea.  Vitals:   09/23/20 1519  BP: 111/76  Pulse: 90  Weight: 236 lb (107 kg)    HISTORY: OB History  Gravida Para Term Preterm AB Living  5 3 3   1 3   SAB IAB Ectopic Multiple Live Births  1       3    # Outcome Date GA Lbr Len/2nd Weight Sex Delivery Anes PTL Lv  5 Current           4 Term 11/25/13 [redacted]w[redacted]d  7 lb 10 oz (3.459 kg) M Vag-Spont   LIV  3 Term 04/20/12 [redacted]w[redacted]d  7 lb 8 oz (3.402 kg) M Vag-Spont  Y LIV  2 Term 06/20/10 [redacted]w[redacted]d  9 lb 14 oz (4.479 kg) F Vag-Spont   LIV  1 SAB 02/26/10 [redacted]w[redacted]d          Past Medical History:  Diagnosis Date  . Anemia   . Migraine    Past Surgical History:  Procedure Laterality Date  . CERVICAL CERCLAGE    . CERVICAL CONE BIOPSY     Family History  Problem Relation Age of Onset  . Hypertension Mother   . Diabetes Mother   . Dementia Father      Exam    Uterus:     Pelvic Exam:    Perineum: No Hemorrhoids   Vulva: normal   Vagina:  normal mucosa   pH:     Cervix: no lesions   Adnexa: normal adnexa   Bony Pelvis: average  System: Breast:  normal appearance, no masses or tenderness   Skin: normal coloration and turgor, no rashes    Neurologic: oriented, normal mood   Extremities: normal strength, tone, and muscle mass   HEENT PERRLA   Mouth/Teeth mucous membranes moist, pharynx normal without lesions and dental hygiene good   Neck supple   Cardiovascular: regular rate and rhythm, no murmurs or gallops   Respiratory:  appears well, vitals normal, no respiratory distress, acyanotic, normal RR, neck free of mass or lymphadenopathy, chest clear, no wheezing, crepitations, rhonchi, normal symmetric air entry   Abdomen: obese   Urinary: urethral meatus normal       Assessment:    Pregnancy: [redacted]w[redacted]d Patient Active Problem List   Diagnosis Date Noted  . Supervision of high risk pregnancy, antepartum 09/01/2020  . Depression 08/24/2020        Plan:     Initial labs drawn. Prenatal vitamins. Problem list reviewed and updated. Genetic Screening.  Ultrasound discussed; fetal survey: ordered.  Follow up in 4 weeks. 10/24/2020 <14 weeks, schedule cerclage 14-15 weeks 50% of 30 min visit spent on counseling and coordination of care.     [place] 09/23/2020 NOB Twins  Genetic Screening:Yes Desires and wants to know Gender.  U/S on 08/22/20 pt was [redacted]w[redacted]d EDD:03/31/21  Last pap:05/31/19 WNL  +TRICH on 09/08/20 GAD= 7  PHQ-9 =1  CC: Back pain. Getting 164 for FHT's on both sides will consult with provider.

## 2020-09-24 ENCOUNTER — Encounter: Payer: Self-pay | Admitting: *Deleted

## 2020-09-24 ENCOUNTER — Telehealth: Payer: Self-pay | Admitting: *Deleted

## 2020-09-24 ENCOUNTER — Other Ambulatory Visit: Payer: Self-pay

## 2020-09-24 LAB — OBSTETRIC PANEL, INCLUDING HIV
Antibody Screen: NEGATIVE
Basophils Absolute: 0 10*3/uL (ref 0.0–0.2)
Basos: 0 %
EOS (ABSOLUTE): 0.1 10*3/uL (ref 0.0–0.4)
Eos: 1 %
HIV Screen 4th Generation wRfx: NONREACTIVE
Hematocrit: 35.9 % (ref 34.0–46.6)
Hemoglobin: 12 g/dL (ref 11.1–15.9)
Hepatitis B Surface Ag: NEGATIVE
Immature Grans (Abs): 0.1 10*3/uL (ref 0.0–0.1)
Immature Granulocytes: 1 %
Lymphocytes Absolute: 1.7 10*3/uL (ref 0.7–3.1)
Lymphs: 18 %
MCH: 28.6 pg (ref 26.6–33.0)
MCHC: 33.4 g/dL (ref 31.5–35.7)
MCV: 86 fL (ref 79–97)
Monocytes Absolute: 0.8 10*3/uL (ref 0.1–0.9)
Monocytes: 8 %
Neutrophils Absolute: 6.8 10*3/uL (ref 1.4–7.0)
Neutrophils: 72 %
Platelets: 226 10*3/uL (ref 150–450)
RBC: 4.19 x10E6/uL (ref 3.77–5.28)
RDW: 14.1 % (ref 11.7–15.4)
RPR Ser Ql: NONREACTIVE
Rh Factor: POSITIVE
Rubella Antibodies, IGG: 4.33 index (ref >0.99)
WBC: 9.5 10*3/uL (ref 3.4–10.8)

## 2020-09-24 LAB — HEPATITIS C ANTIBODY: Hep C Virus Ab: <0.1 s/co ratio (ref 0.0–0.9)

## 2020-09-24 NOTE — Telephone Encounter (Signed)
Call to patient. Voice mail has name confirmation. Left message that procedure is scheduled for 10-08-20 at 1130 am and arrive at 9:30am.  Also stated will receive telephone call and letter in mail with additional instructions. Can call back to [phone].

## 2020-09-25 LAB — CULTURE, OB URINE

## 2020-09-25 LAB — URINE CULTURE, OB REFLEX

## 2020-09-29 ENCOUNTER — Telehealth (HOSPITAL_COMMUNITY): Payer: Self-pay | Admitting: *Deleted

## 2020-09-29 ENCOUNTER — Encounter (HOSPITAL_COMMUNITY): Payer: Self-pay | Admitting: *Deleted

## 2020-09-29 NOTE — Telephone Encounter (Signed)
Preadmission screen Arrive 3/25 0930 NPO after MN no medications

## 2020-09-30 ENCOUNTER — Telehealth: Payer: Self-pay

## 2020-09-30 ENCOUNTER — Other Ambulatory Visit: Payer: Self-pay

## 2020-09-30 ENCOUNTER — Ambulatory Visit: Payer: Medicaid Other | Attending: Obstetrics & Gynecology

## 2020-09-30 ENCOUNTER — Encounter: Payer: Self-pay | Admitting: Obstetrics & Gynecology

## 2020-09-30 ENCOUNTER — Ambulatory Visit: Payer: Medicaid Other | Attending: Obstetrics and Gynecology | Admitting: Obstetrics and Gynecology

## 2020-09-30 ENCOUNTER — Other Ambulatory Visit: Payer: Self-pay | Admitting: Obstetrics & Gynecology

## 2020-09-30 ENCOUNTER — Ambulatory Visit: Payer: Medicaid Other | Admitting: *Deleted

## 2020-09-30 ENCOUNTER — Encounter: Payer: Self-pay | Admitting: *Deleted

## 2020-09-30 DIAGNOSIS — O30041 Twin pregnancy, dichorionic/diamniotic, first trimester: Secondary | ICD-10-CM | POA: Diagnosis not present

## 2020-09-30 DIAGNOSIS — O0990 Supervision of high risk pregnancy, unspecified, unspecified trimester: Secondary | ICD-10-CM | POA: Diagnosis not present

## 2020-09-30 DIAGNOSIS — Z3A13 13 weeks gestation of pregnancy: Secondary | ICD-10-CM | POA: Insufficient documentation

## 2020-09-30 DIAGNOSIS — O09291 Supervision of pregnancy with other poor reproductive or obstetric history, first trimester: Secondary | ICD-10-CM

## 2020-09-30 NOTE — Telephone Encounter (Signed)
mar/patient left msg for diabetic counselor few days ago-requesting to r/s this appointment, stating may not be able to make it in today.

## 2020-09-30 NOTE — Progress Notes (Unsigned)
Maternal-Fetal Medicine G5 [phone]. Patient with twin pregnancy is here for ultrasound.  Obstetric history is significant for a spontaneous pregnancy loss in 2011 at 16 weeks' gestation. Patient gives history of abdominal cramping before miscarriage. No severe vaginal bleeding or fever or any signs of infection.  In subsequent pregnancies, she had prophylactic cerclages and carried the pregnancies to term. She had 3 term vaginal deliveries (2012/2013/2015). All her children are in good health.  Gyn history: Cone biopsy procedure about 18 years ago. PMH: No history of hypertension or diabetes or any other chronic medical conditions. PSH: Cerclage, cone biopsy. Allergies: NKDA. Social: Works with an Scientist, forensic. Her partner is [place] American, and he is in good health. He is not the father of her previous children. Family: Mother had pulmonary embolism and is on anticoagulants. Prenatal: On cell-free fetal DNA screening, the risks of fetal aneuploidies are not increased. Dizygotic twins (female/female) were confirmed. Patient will be undergoing prophylactic cerclage at 14 to 15 weeks' gestation.  On today's ultrasound, we confirmed dichorionic-diamniotic twin pregnancy. Twin A: Lower fetus, anterior placenta, female fetus. The CRL measurement is consistent with her previously established dates and good fetal heart activity is seen. Fetal anatomical survey was limited but appears normal. Twin B: Upper fetus, anterior placenta, female fetus. The CRL measurement is consistent with her previously established dates and good fetal heart activity is seen. Fetal anatomical survey was limited but appears normal. Our concerns include: History of mid-trimester pregnancy loss -Patient had spontaneous mid-trimester pregnancy loss at 37 weeks' gestation and the history strongly suggests cervical incompetence. She also had cone biopsy procedure which is a risk factor for cervical incompetence.  -She had  successful outcomes in subsequent pregnancies following prophylactic cerclage placement. -Patient is very keen on having prophylactic cerclage in this pregnancy. -Twin pregnancy was considered a relative contraindication for cerclage and no prolongation of pregnancy following cerclage was observed. However, recent studies have shown that in twin pregnancies with cervical shortening (less than 1.5 cm), rescue cerclage has shown improved outcomes. -Patient has a clear history of cervical incompetence and past surgical history of cone biopsy procedure. Cerclage may be beneficial. I discussed the option of weekly cervical length measurement from 15 weeks 'gestation till 23 weeks and perform rescue cerclage as needed. Patient is very firm in her decision to undergo prophylactic cerclage.  Twin pregnancy:  I explained the significance of chorionicity and its implications.  Possible complications associated with twin pregnancy include preterm labor/delivery (most common), fetal growth restriction of one or both twins, malpresentations and increased cesarean delivery rate, postpartum hemorrhage. I also informed her that maternal complications including gestational diabetes and gestational hypertension/preeclampsia are more common. I discussed the mode of delivery that is based on the presentations.  If both have Vx/Vx or Vx/non-vertex presentations, vaginal delivery may be considered. In Vx/non-vx, vaginal delivery followed by internal podalic version of second twin will achieve vaginal delivery. In non-vx first twin presentation, cesarean delivery will be performed.  I emphasized the importance of weight gain (24 lbs by 24 weeks) to improve fetal weight and decrease the chances of preterm delivery. Twin pregnancy is at high risk for preeclampsia. I discussed the benefit of low-dose aspirin prophylaxis that delays or prevents preeclampsia. Patient does not have contraindications to aspirin. I recommended  aspirin (81 mg daily) to be started from now until delivery. She does not have contraindications to aspirin.  Recommendations -Prophylactic cerclage. -An appointment was made for her to return in 3 weeks for transvaginal assessment  of cervical length. -Fetal anatomical survey on 11/11/20. -Fetal growth assessments every 4 weeks. -BPP at 36 and 37 weeks. -Delivery at 38 weeks. -Low-dose aspirin (81 mg daily) from now till delivery.  Thank you for consultation. Consultation including face-to-face counseling 30 minutes.

## 2020-09-30 NOTE — Progress Notes (Signed)
Maternal-Fetal Medicine Name: Andrea Logan MRN: [id number] DOB: Sep 20, 1985 Referring Provider: Dr. Scheryl Darter  I [facility] the [facility] She is G5 P3013. with twin pregnancy is here for ultrasound.   Obstetric history is significant for a spontaneous pregnancy loss in 2011 at 16 weeks' gestation. Patient gives history of abdominal cramping before miscarriage. No severe vaginal bleeding or fever or any signs of infection. In subsequent pregnancies, she had prophylactic cerclages and carried the pregnancies to term. She had 3 term vaginal deliveries (2012/2013/2015). All her children are in good health. Gyn history: Cone biopsy procedure about 18 years ago. PMH: No history of hypertension or diabetes or any other chronic medical conditions. PSH: Cerclage, cone biopsy. Allergies: NKDA. Social: Works with an Scientist, forensic. Her partner is [place] American, and he is in good health. He is not the father of her previous children. Family: Mother had pulmonary embolism and is on anticoagulants. Prenatal: On cell-free fetal DNA screening, the risks of fetal aneuploidies are not increased. Dizygotic twins (female/female) were confirmed. Patient will be undergoing prophylactic cerclage at 14 to 15 weeks' gestation.  On today's ultrasound, we confirmed dichorionic-diamniotic twin pregnancy. Twin A: Lower fetus, anterior placenta, female fetus. The CRL measurement is consistent with her previously established dates and good fetal heart activity is seen. Fetal anatomical survey was limited but appears normal.  Twin B: Upper fetus, anterior placenta, female fetus. The CRL measurement is consistent with her previously established dates and good fetal heart activity is seen. Fetal anatomical survey was limited but appears normal.  Our concerns include: History of mid-trimester pregnancy loss -Patient had spontaneous mid-trimester pregnancy loss at 8  weeks' gestation and the history strongly suggests cervical incompetence. She also had cone biopsy procedure which is a risk factor for cervical incompetence.  -She had successful outcomes in subsequent pregnancies following prophylactic cerclage placement. -Patient is very keen on having prophylactic cerclage in this pregnancy. -Twin pregnancy was considered a relative contraindication for cerclage and no prolongation of pregnancy following cerclage was observed. However, recent studies have shown that in twin pregnancies with cervical shortening (less than 1.5 cm), rescue cerclage has shown improved outcomes. -Patient has a clear history of cervical incompetence and past surgical history of cone biopsy procedure. Cerclage may be beneficial. I discussed the option of weekly cervical length measurement from 15 weeks 'gestation till 23 weeks and perform rescue cerclage as needed. Patient is very firm in her decision to undergo prophylactic cerclage.  Twin pregnancy:  I explained the significance of chorionicity and its implications.  Possible complications associated with twin pregnancy include preterm labor/delivery (most common), fetal growth restriction of one or both twins, malpresentations and increased cesarean delivery rate, postpartum hemorrhage. I also informed her that maternal complications including gestational diabetes and gestational hypertension/preeclampsia are more common. I discussed the mode of delivery that is based on the presentations.  If both have Vx/Vx or Vx/non-vertex presentations, vaginal delivery may be considered. In Vx/non-vx, vaginal delivery followed by internal podalic version of second twin will achieve vaginal delivery. In non-vx first twin presentation, cesarean delivery will be performed.  I emphasized the importance of weight gain (24 lbs by 24 weeks) to improve fetal weight and decrease the chances of preterm delivery. Twin pregnancy is at high risk for  preeclampsia. I discussed the benefit of low-dose aspirin prophylaxis that delays or prevents preeclampsia. Patient does not have contraindications to aspirin. I recommended aspirin (81 mg daily) to be  started from now until delivery. She does not have contraindications to aspirin.  Recommendations -Prophylactic cerclage. -An appointment was made for her to return in 3 weeks for transvaginal assessment of cervical length. -Fetal anatomical survey on 11/11/20. -Fetal growth assessments every 4 weeks. -BPP at 36 and 37 weeks. -Delivery at 38 weeks. -Low-dose aspirin (81 mg daily) from now till delivery.  Thank you for consultation. Consultation including face-to-face counseling 30 minutes.

## 2020-10-01 ENCOUNTER — Other Ambulatory Visit: Payer: Self-pay | Admitting: *Deleted

## 2020-10-01 DIAGNOSIS — O30049 Twin pregnancy, dichorionic/diamniotic, unspecified trimester: Secondary | ICD-10-CM

## 2020-10-06 ENCOUNTER — Other Ambulatory Visit (HOSPITAL_COMMUNITY): Payer: Medicaid Other | Attending: Obstetrics & Gynecology

## 2020-10-07 ENCOUNTER — Ambulatory Visit (INDEPENDENT_AMBULATORY_CARE_PROVIDER_SITE_OTHER): Payer: Medicaid Other | Admitting: Licensed Clinical Social Worker

## 2020-10-07 DIAGNOSIS — F439 Reaction to severe stress, unspecified: Secondary | ICD-10-CM

## 2020-10-07 DIAGNOSIS — F4322 Adjustment disorder with anxiety: Secondary | ICD-10-CM | POA: Diagnosis not present

## 2020-10-08 ENCOUNTER — Ambulatory Visit (HOSPITAL_COMMUNITY)
Admission: RE | Admit: 2020-10-08 | Discharge: 2020-10-08 | Disposition: A | Discharge status: Home / Self Care | Payer: Medicaid Other | Attending: Obstetrics & Gynecology | Admitting: Obstetrics & Gynecology

## 2020-10-08 ENCOUNTER — Ambulatory Visit (HOSPITAL_COMMUNITY): Payer: Medicaid Other | Admitting: Anesthesiology

## 2020-10-08 ENCOUNTER — Encounter (HOSPITAL_COMMUNITY)
Admission: RE | Disposition: A | Discharge status: Home / Self Care | Payer: Self-pay | Source: Home / Self Care | Attending: Obstetrics & Gynecology

## 2020-10-08 ENCOUNTER — Encounter (HOSPITAL_COMMUNITY): Payer: Self-pay | Admitting: Obstetrics & Gynecology

## 2020-10-08 ENCOUNTER — Other Ambulatory Visit: Payer: Self-pay

## 2020-10-08 DIAGNOSIS — O26879 Cervical shortening, unspecified trimester: Secondary | ICD-10-CM | POA: Diagnosis not present

## 2020-10-08 DIAGNOSIS — O30009 Twin pregnancy, unspecified number of placenta and unspecified number of amniotic sacs, unspecified trimester: Secondary | ICD-10-CM | POA: Diagnosis not present

## 2020-10-08 DIAGNOSIS — O3432 Maternal care for cervical incompetence, second trimester: Secondary | ICD-10-CM | POA: Diagnosis not present

## 2020-10-08 DIAGNOSIS — Z20822 Contact with and (suspected) exposure to covid-19: Secondary | ICD-10-CM | POA: Insufficient documentation

## 2020-10-08 DIAGNOSIS — Z3A14 14 weeks gestation of pregnancy: Secondary | ICD-10-CM | POA: Diagnosis not present

## 2020-10-08 DIAGNOSIS — O30042 Twin pregnancy, dichorionic/diamniotic, second trimester: Secondary | ICD-10-CM | POA: Insufficient documentation

## 2020-10-08 DIAGNOSIS — O26872 Cervical shortening, second trimester: Secondary | ICD-10-CM | POA: Diagnosis not present

## 2020-10-08 HISTORY — PX: CERVICAL CERCLAGE: SHX1329

## 2020-10-08 LAB — CBC
HCT: 34.8 % — ABNORMAL LOW (ref 36.0–46.0)
Hemoglobin: 11.3 g/dL — ABNORMAL LOW (ref 12.0–15.0)
MCH: 28.5 pg (ref 26.0–34.0)
MCHC: 32.5 g/dL (ref 30.0–36.0)
MCV: 87.9 fL (ref 80.0–100.0)
Platelets: 187 10*3/uL (ref 150–400)
RBC: 3.96 MIL/uL (ref 3.87–5.11)
RDW: 15.1 % (ref 11.5–15.5)
WBC: 8.2 10*3/uL (ref 4.0–10.5)
nRBC: 0 % (ref 0.0–0.2)

## 2020-10-08 LAB — RESP PANEL BY RT-PCR (FLU A&B, COVID) ARPGX2
Influenza A by PCR: NEGATIVE
Influenza B by PCR: NEGATIVE
SARS Coronavirus 2 by RT PCR: NEGATIVE

## 2020-10-08 LAB — RPR: RPR Ser Ql: NONREACTIVE

## 2020-10-08 LAB — TYPE AND SCREEN
ABO/RH(D): A POS
Antibody Screen: NEGATIVE

## 2020-10-08 SURGERY — CERCLAGE, CERVIX, VAGINAL APPROACH
Anesthesia: Spinal | Wound class: Clean Contaminated

## 2020-10-08 MED ORDER — SOD CITRATE-CITRIC ACID 500-334 MG/5ML PO SOLN
30.0000 mL | ORAL | Status: AC
  Administered 2020-10-08: 30 mL via ORAL

## 2020-10-08 MED ORDER — ACETAMINOPHEN 500 MG PO TABS
ORAL_TABLET | ORAL | Status: AC
  Filled 2020-10-08: qty 2

## 2020-10-08 MED ORDER — NALOXONE HCL 4 MG/10ML IJ SOLN: 1.0000 ug/kg/h | INTRAVENOUS | Status: DC | PRN

## 2020-10-08 MED ORDER — OXYCODONE HCL 5 MG PO TABS: 5.0000 mg | ORAL_TABLET | Freq: Once | ORAL | Status: DC | PRN

## 2020-10-08 MED ORDER — CHLOROPROCAINE HCL 50 MG/5ML IT SOLN
INTRATHECAL | Status: AC
  Filled 2020-10-08: qty 5

## 2020-10-08 MED ORDER — FENTANYL CITRATE (PF) 100 MCG/2ML IJ SOLN
INTRAMUSCULAR | Status: AC
  Filled 2020-10-08: qty 2

## 2020-10-08 MED ORDER — NALBUPHINE HCL 10 MG/ML IJ SOLN
5.0000 mg | Freq: Once | INTRAMUSCULAR | Status: DC | PRN
Start: 2020-10-08 — End: 2020-10-08

## 2020-10-08 MED ORDER — ONDANSETRON HCL 4 MG/2ML IJ SOLN: 4.0000 mg | Freq: Three times a day (TID) | INTRAMUSCULAR | Status: DC | PRN

## 2020-10-08 MED ORDER — SCOPOLAMINE 1 MG/3DAYS TD PT72: 1.0000 | MEDICATED_PATCH | Freq: Once | TRANSDERMAL | Status: DC

## 2020-10-08 MED ORDER — SOD CITRATE-CITRIC ACID 500-334 MG/5ML PO SOLN
ORAL | Status: AC
  Filled 2020-10-08: qty 30

## 2020-10-08 MED ORDER — NALOXONE HCL 0.4 MG/ML IJ SOLN: 0.4000 mg | INTRAMUSCULAR | Status: DC | PRN

## 2020-10-08 MED ORDER — OXYCODONE HCL 5 MG/5ML PO SOLN: 5.0000 mg | Freq: Once | ORAL | Status: DC | PRN

## 2020-10-08 MED ORDER — FENTANYL CITRATE (PF) 100 MCG/2ML IJ SOLN
INTRAMUSCULAR | Status: DC | PRN
  Administered 2020-10-08: 15 ug via INTRATHECAL

## 2020-10-08 MED ORDER — LACTATED RINGERS IV SOLN: INTRAVENOUS | Status: DC

## 2020-10-08 MED ORDER — ONDANSETRON HCL 4 MG/2ML IJ SOLN: 4.0000 mg | Freq: Once | INTRAMUSCULAR | Status: DC | PRN

## 2020-10-08 MED ORDER — SODIUM CHLORIDE 0.9 % IR SOLN
Status: DC | PRN
  Administered 2020-10-08: 1000 mL

## 2020-10-08 MED ORDER — NALBUPHINE HCL 10 MG/ML IJ SOLN: 5.0000 mg | INTRAMUSCULAR | Status: DC | PRN

## 2020-10-08 MED ORDER — DIPHENHYDRAMINE HCL 50 MG/ML IJ SOLN: 12.5000 mg | INTRAMUSCULAR | Status: DC | PRN

## 2020-10-08 MED ORDER — CHLOROPROCAINE HCL 1 % IJ SOLN
INTRAMUSCULAR | Status: DC | PRN
  Administered 2020-10-08: 4 mL

## 2020-10-08 MED ORDER — DIPHENHYDRAMINE HCL 25 MG PO CAPS: 25.0000 mg | ORAL_CAPSULE | ORAL | Status: DC | PRN

## 2020-10-08 MED ORDER — CHLOROPROCAINE HCL 1 % IJ SOLN: INTRAMUSCULAR | Status: DC | PRN

## 2020-10-08 MED ORDER — SODIUM CHLORIDE 0.9% FLUSH: 3.0000 mL | INTRAVENOUS | Status: DC | PRN

## 2020-10-08 MED ORDER — POVIDONE-IODINE 10 % EX SWAB
2.0000 "application " | Freq: Once | CUTANEOUS | Status: AC
  Administered 2020-10-08: 2 via TOPICAL

## 2020-10-08 MED ORDER — FENTANYL CITRATE (PF) 100 MCG/2ML IJ SOLN: 25.0000 ug | INTRAMUSCULAR | Status: DC | PRN

## 2020-10-08 MED ORDER — ACETAMINOPHEN 500 MG PO TABS
1000.0000 mg | ORAL_TABLET | ORAL | Status: AC
Start: 2020-10-08 — End: 2020-10-08
  Administered 2020-10-08: 1000 mg via ORAL

## 2020-10-08 SURGICAL SUPPLY — 14 items
CANISTER SUCT 3000ML PPV (MISCELLANEOUS) IMPLANT
GLOVE BIO SURGEON STRL SZ 6.5 (GLOVE) ×2 IMPLANT
GLOVE BIOGEL PI IND STRL 7.0 (GLOVE) ×2 IMPLANT
GLOVE BIOGEL PI INDICATOR 7.0 (GLOVE) ×2
GOWN STRL REUS W/TWL LRG LVL3 (GOWN DISPOSABLE) ×4 IMPLANT
NS IRRIG 1000ML POUR BTL (IV SOLUTION) ×2 IMPLANT
PACK VAGINAL MINOR WOMEN LF (CUSTOM PROCEDURE TRAY) ×2 IMPLANT
PAD OB MATERNITY 4.3X12.25 (PERSONAL CARE ITEMS) ×2 IMPLANT
PAD PREP 24X48 CUFFED NSTRL (MISCELLANEOUS) ×2 IMPLANT
SUT PROLENE 1 CT 1 30 (SUTURE) ×2 IMPLANT
TOWEL OR 17X24 6PK STRL BLUE (TOWEL DISPOSABLE) ×4 IMPLANT
TRAY FOLEY W/BAG SLVR 14FR (SET/KITS/TRAYS/PACK) ×2 IMPLANT
TUBING NON-CON 1/4 X 20 CONN (TUBING) IMPLANT
YANKAUER SUCT BULB TIP NO VENT (SUCTIONS) IMPLANT

## 2020-10-08 NOTE — Anesthesia Postprocedure Evaluation (Signed)
Anesthesia Post Note  Patient: Music therapist  Procedure(s) Performed: CERCLAGE CERVICAL (N/A )     Patient location during evaluation: PACU Anesthesia Type: Spinal Level of consciousness: oriented and awake and alert Pain management: pain level controlled Vital Signs Assessment: post-procedure vital signs reviewed and stable Respiratory status: spontaneous breathing, respiratory function stable and nonlabored ventilation Cardiovascular status: blood pressure returned to baseline and stable Postop Assessment: no headache, no backache, no apparent nausea or vomiting and spinal receding Anesthetic complications: no   No complications documented.  Last Vitals:  Vitals:   10/08/20 1346 10/08/20 1400  BP: 99/63 108/64  Pulse: 88 84  Resp: 19 19  Temp:    SpO2: 100% 100%    Last Pain:  Vitals:   10/08/20 1400  TempSrc:   PainSc: 0-No pain   Pain Goal:                Epidural/Spinal Function Cutaneous sensation: Normal sensation (10/08/20 1400), Patient able to flex knees: Yes (10/08/20 1400), Patient able to lift hips off bed: Yes (10/08/20 1400), Back pain beyond tenderness at insertion site: No (10/08/20 1400), Progressively worsening motor and/or sensory loss: No (10/08/20 1400), Bowel and/or bladder incontinence post epidural: No (10/08/20 1400)  Lucretia Kern

## 2020-10-08 NOTE — Transfer of Care (Signed)
Immediate Anesthesia Transfer of Care Note  Patient: Andrea Logan  Procedure(s) Performed: CERCLAGE CERVICAL (N/A )  Patient Location: PACU  Anesthesia Type:Spinal  Level of Consciousness: awake  Airway & Oxygen Therapy: Patient Spontanous Breathing  Post-op Assessment: Report given to RN  Post vital signs: Reviewed and stable  Last Vitals:  Vitals Value Taken Time  BP 95/62 10/08/20 1231  Temp 36.8 C 10/08/20 1212  Pulse 81 10/08/20 1234  Resp 30 10/08/20 1230  SpO2 100 % 10/08/20 1234  Vitals shown include unvalidated device data.  Last Pain:  Vitals:   10/08/20 1230  TempSrc:   PainSc: 0-No pain         Complications: No complications documented.

## 2020-10-08 NOTE — Anesthesia Procedure Notes (Signed)
Spinal  Patient location during procedure: OR Reason for block: surgical anesthesia Staffing Performed: anesthesiologist  Anesthesiologist: Delories Mauri E, MD Preanesthetic Checklist Completed: patient identified, IV checked, risks and benefits discussed, surgical consent, monitors and equipment checked, pre-op evaluation and timeout performed Spinal Block Patient position: sitting Prep: DuraPrep and site prepped and draped Patient monitoring: continuous pulse ox, blood pressure and heart rate Approach: midline Location: L3-4 Injection technique: single-shot Needle Needle type: Pencan  Needle gauge: 24 G Needle length: 9 cm Assessment Events: CSF return Additional Notes Functioning IV was confirmed and monitors were applied. Sterile prep and drape, including hand hygiene and sterile gloves were used. The patient was positioned and the spine was prepped. The skin was anesthetized with lidocaine.  Free flow of clear CSF was obtained prior to injecting local anesthetic into the CSF. The needle was carefully withdrawn. The patient tolerated the procedure well.     

## 2020-10-08 NOTE — Op Note (Signed)
Preop Diagnosis: Short cervix, twin gestation   Postop Diagnosis: Short cervix, twin gestation   Procedure: CERCLAGE CERVICAL   Anesthesia: Choice   Anesthesiologist: Lucretia Kern, MD   Attending: Dr. Myna Hidalgo  Findings: Multiparous cervix ~ 1cm dilated, long, high  Pathology: none  Fluids: 500cc  UOP: 200cc  EBL: 5cc  Complications: none  Procedure:The patient was taken to the operating room after the risks, benefits and alternatives discussed with the patient and consent signed and witnessed.  The patient was given a spinal per anesthesia and placed in the dorsal lithotomy position.  The patient was prepped and draped in the usual sterile fashion.  A cervical cerclage stitch was placed using Mersilene and the knot was tied anteriorly on the cervix.  Membranes remained intact and post procedure fetal heart rate was 140/145.  Sponge, lap and needle count was correct and the patient was transferred to the recovery room in good condition.   Myna Hidalgo, DO Attending Obstetrician & Gynecologist, [facility] for [employer], [facility]

## 2020-10-08 NOTE — Anesthesia Preprocedure Evaluation (Signed)
Anesthesia Evaluation  Patient identified by MRN, date of birth, ID band Patient awake    Reviewed: Allergy & Precautions, H&P , NPO status , Patient's Chart, lab work & pertinent test results  History of Anesthesia Complications Negative for: history of anesthetic complications  Airway Mallampati: II  TM Distance: >3 FB Neck ROM: full    Dental no notable dental hx.    Pulmonary neg pulmonary ROS,    Pulmonary exam normal        Cardiovascular negative cardio ROS Normal cardiovascular exam     Neuro/Psych negative neurological ROS  negative psych ROS   GI/Hepatic negative GI ROS, Neg liver ROS,   Endo/Other  negative endocrine ROS  Renal/GU negative Renal ROS  negative genitourinary   Musculoskeletal   Abdominal   Peds  Hematology negative hematology ROS (+)   Anesthesia Other Findings   Reproductive/Obstetrics (+) Pregnancy                             Anesthesia Physical Anesthesia Plan  ASA: II  Anesthesia Plan: Spinal   Post-op Pain Management:    Induction:   PONV Risk Score and Plan: Ondansetron and Treatment may vary due to age or medical condition  Airway Management Planned:   Additional Equipment:   Intra-op Plan:   Post-operative Plan:   Informed Consent: I have reviewed the patients History and Physical, chart, labs and discussed the procedure including the risks, benefits and alternatives for the proposed anesthesia with the patient or authorized representative who has indicated his/her understanding and acceptance.       Plan Discussed with:   Anesthesia Plan Comments:         Anesthesia Quick Evaluation  

## 2020-10-08 NOTE — Discharge Instructions (Signed)
Cervical Cerclage, Care After This sheet gives you information about how to care for yourself after your procedure. Your health care provider may also give you more specific instructions. If you have problems or questions, contact your health care provider. What can I expect after the procedure? After your procedure, it is common to have:  Cramping in your abdomen.  Mucus discharge from your vagina. This may last for several days.  Painful urination (dysuria).  Spotting, or [place] drops of blood coming from your vagina. Follow these instructions at home: Medicines  Take over-the-counter and prescription medicines only as told by your health care provider.  Ask your health care provider if the medicine prescribed to you requires you to avoid driving or using heavy machinery. General instructions  If you are told to go on bed rest, follow instructions from your health care provider. You may need to be on bed rest for up to 3 days.  Keep track of your vaginal discharge and watch for any changes. If you notice changes, tell your health care provider.  Avoid physical activities and exercise until your health care provider approves. Ask your health care provider what activities are safe for you.  Do not douche or have sex until your health care provider says it is okay to do so.  Keep all follow-up visits, including prenatal visits, as told by your health care provider. This is important. ? Prenatal visits are all the care that you receive before the birth of your baby. ? You may also need an ultrasound.  You may be asked to have weekly visits to have your cervix checked.   Contact a health care provider if you:  Have abnormal discharge from your vagina, such as clots.  Have a bad-smelling discharge from your vagina.  Develop a rash on your skin. This may look like redness and swelling.  Become light-headed or feel like you are going to faint.  Have abdominal pain that does not  get better with medicine.  Have nausea or vomiting that does not go away. Get help right away if you:  Have vaginal bleeding that is heavier or more frequent than spotting.  Are leaking fluid or your water breaks.  Have a fever or chills.  Faint.  Have uterine contractions. These may feel like: ? A back ache. ? Lower abdominal pain. ? Mild cramps, similar to menstrual cramps. ? Tightening or pressure in your abdomen.  Think that your baby is not moving as much as usual, or you cannot feel your baby move.  Have chest pain.  Have shortness of breath. Summary  After the procedure, it is common to have cramping, vaginal discharge, painful urination, and [place] drops of blood coming from your vagina.  If you are told to go on bed rest, follow instructions from your health care provider. You may need to be on bed rest for up to 3 days.  Keep track of your vaginal discharge and watch for any changes. If you notice changes, tell your health care provider.  Contact a health care provider if you have abnormal vaginal discharge, become light-headed, or have pain that cannot be controlled with medicines.  Get help right away if you have heavy vaginal bleeding, your water breaks, or you have uterine contractions. Also, get help right away if your baby is not moving as much as usual, or you have chest pain or shortness of breath. This information is not intended to replace advice given to you by your health care   provider. Make sure you discuss any questions you have with your health care provider. Document Revised: 06/22/2019 Document Reviewed: 12/27/2018 Elsevier Patient Education  2021 Elsevier Inc.  

## 2020-10-08 NOTE — H&P (Signed)
OBSTETRIC ADMISSION HISTORY AND PHYSICAL  Andrea Logan is a 35 y.o. female [phone] with IUP at [redacted]w[redacted]d by early OB US with di/di twins presenting for cerclage due to h/o cervical shortening.  Today she notes no acute complaints.  Denies vaginal discharge, itching or irritation.  Denies vaginal bleeding.  Denies LOF.  Denies pelvic or abdominal pain.   Prenatal History/Complications:  -di/di twins- confirmed by [place]   Past Medical History: Past Medical History:  Diagnosis Date  . Anemia   . Migraine   . Vaginal Pap smear, abnormal     Past Surgical History: Past Surgical History:  Procedure Laterality Date  . CERVICAL CERCLAGE    . CERVICAL CONE BIOPSY      Obstetrical History: OB History    Gravida  5   Para  3   Term  3   Preterm      AB  1   Living  3     SAB  1   IAB      Ectopic      Multiple      Live Births  3           Social History Social History   Socioeconomic History  . Marital status: Single    Spouse name: Not on file  . Number of children: 3  . Years of education: Not on file  . Highest education level: Associate degree: occupational, Scientist, product/process development, or vocational program  Occupational History  . Not on file  Tobacco Use  . Smoking status: Never Smoker  . Smokeless tobacco: Never Used  Vaping Use  . Vaping Use: Never used  Substance and Sexual Activity  . Alcohol use: No    Alcohol/week: 0.0 standard drinks  . Drug use: No  . Sexual activity: Yes    Birth control/protection: None    Comment: IUD removed Sept 2021  Other Topics Concern  . Not on file  Social History Narrative  . Not on file   Social Determinants of Health   Financial Resource Strain: Not on file  Food Insecurity: Not on file  Transportation Needs: Not on file  Physical Activity: Not on file  Stress: Not on file  Social Connections: Not on file    Family History: Family History  Problem Relation Age of Onset  . Hypertension Mother   . Diabetes Mother    . Dementia Father     Allergies: No Known Allergies  Medications Prior to Admission  Medication Sig Dispense Refill Last Dose  . acetaminophen (TYLENOL) 325 MG tablet Take 162.5 mg by mouth every 6 (six) hours as needed for headache or moderate pain.   Past Week at Unknown time  . Prenatal 27-1 MG TABS Take 1 tablet by mouth daily at 6 (six) AM. 30 tablet 12 10/07/2020 at Unknown time  . Blood Pressure Monitoring (BLOOD PRESSURE MONITOR AUTOMAT) DEVI 1 Device by Does not apply route daily. Automatic blood pressure cuff regular size. To monitor blood pressure regularly at home. ICD-10 code: O09.90 1 each 0   . metroNIDAZOLE (FLAGYL) 500 MG tablet Take 1 tablet (500 mg total) by mouth 2 (two) times daily. (Patient not taking: No sig reported) 14 tablet 0 Completed Course at Unknown time  . Misc. Devices (GOJJI WEIGHT SCALE) MISC 1 Device by Does not apply route daily as needed. To weight self daily as needed at home. ICD-10 code: O09.90 1 each 0      Review of Systems   All systems reviewed  and negative except as stated in HPI  Blood pressure 117/72, pulse 95, temperature 99.9 F (37.7 C), temperature source Oral, resp. rate 18, last menstrual period 06/28/2020. General appearance: alert, cooperative and no distress Lungs: clear to auscultation bilaterally Heart: regular rate and rhythm Abdomen: gravid, soft and non-tender Pelvic: deferred BSUS: FHT x 2- Twin A: 150 Twin B: 130 Extremities: Homans sign is negative, no sign of DVT    Results for orders placed or performed during the hospital encounter of 10/08/20 (from the past 24 hour(s))  CBC   Collection Time: 10/08/20  9:42 AM  Result Value Ref Range   WBC 8.2 4.0 - 10.5 K/uL   RBC 3.96 3.87 - 5.11 MIL/uL   Hemoglobin 11.3 (L) 12.0 - 15.0 g/dL   HCT 65.7 (L) 90.3 - 83.3 %   MCV 87.9 80.0 - 100.0 fL   MCH 28.5 26.0 - 34.0 pg   MCHC 32.5 30.0 - 36.0 g/dL   RDW 38.3 29.1 - 91.6 %   Platelets 187 150 - 400 K/uL   nRBC 0.0  0.0 - 0.2 %  Type and screen [facility]   Collection Time: 10/08/20  9:42 AM  Result Value Ref Range   ABO/RH(D) PENDING    Antibody Screen PENDING    Sample Expiration      10/11/2020,2359 Performed at [facility], [address] [place], [place] [address]     Patient Active Problem List   Diagnosis Date Noted  . Supervision of high risk pregnancy, antepartum 09/01/2020  . Depression 08/24/2020    Assessment/Plan:  Andrea Logan is a 35 y.o. [id number] at [redacted]w[redacted]d here for cervical cerclage due to cervical shortening -NPO -LR @ 125cc/hr -Risk/benefits reviewed including but not limited to risk of bleeding, infection, preterm complications such as membrane rupture and preterm labor.  Questions and concerns were addressed and she desires to proceed.  Andrea Hidalgo, DO Attending Obstetrician & Gynecologist, [facility] for [employer], [facility]

## 2020-10-08 NOTE — BH Specialist Note (Signed)
Integrated Behavioral Health via Telemedicine Visit  10/08/2020 Andrea Logan [id number]  Number of Integrated Behavioral Health visits: 2/6 Session Start time: 9:00am Session End time: 9:32am Total time: 32 mins via mychart   Referring Provider: Chana Bode MD Patient/Family location: Home  [facility] Provider location: [facility]  All persons participating in visit: Pt Andrea Logan and LCSWA A. Jock Mahon Types of Service: Individual Psychotherapy   I connected with Andrea Logan and/or Andrea Logan's Andrea/a via  Telephone or Video Enabled Telemedicine Application  (Video is Caregility application) and verified that I am speaking with the correct person using two identifiers. Discussed confidentiality: yes   I discussed the limitations of telemedicine and the availability of in person appointments.  Discussed there is a possibility of technology failure and discussed alternative modes of communication if that failure occurs.  I discussed that engaging in this telemedicine visit, they consent to the provision of behavioral healthcare and the services will be billed under their insurance.  Patient and/or legal guardian expressed understanding and consented to Telemedicine visit: yes   Presenting Concerns: Patient and/or family reports the following symptoms/concerns: stress, limited social support, adjustment concerns  Duration of problem: approx one year  ; Severity of problem: mild   Patient and/or Family's Strengths/Protective Factors: Stable housing and employment   Goals Addressed: Patient will: 1.  Reduce symptoms of: adjustment disorder with anxious mood   2.  Increase knowledge and/or ability of: coping skills and develop additional support to alleviate stress   3.  Demonstrate ability to: self manage symptoms   Progress towards Goals: Ongoing   Interventions: Interventions utilized:  Solution focused counseling  Standardized Assessments completed:  Flowsheet Row Initial Prenatal  from 09/23/2020 in [facility]  PHQ-9 Total Score 1      Assessment: Patient currently experiencing adjustment disorder with anxious mood and stress   Patient may benefit from integrated behavioral health   Plan: 1. Follow up with behavioral health clinician on : 4 weeks via mychart or in person  2. Behavioral recommendations: Prioritize rest, discuss parenting plan with FOB, prioritize task in order to prevent burnout,  3. Referral(s): [place] [facility] for older children   I discussed the assessment and treatment plan with the patient and/or parent/guardian. They were provided an opportunity to ask questions and all were answered. They agreed with the plan and demonstrated an understanding of the instructions.   They were advised to call back or seek an in-person evaluation if the symptoms worsen or if the condition fails to improve as anticipated.  Gwyndolyn Saxon, LCSW

## 2020-10-09 ENCOUNTER — Encounter: Payer: Self-pay | Admitting: Obstetrics & Gynecology

## 2020-10-14 DIAGNOSIS — D563 Thalassemia minor: Secondary | ICD-10-CM | POA: Insufficient documentation

## 2020-10-14 HISTORY — DX: Thalassemia minor: D56.3

## 2020-10-21 ENCOUNTER — Ambulatory Visit: Payer: Medicaid Other

## 2020-10-21 ENCOUNTER — Encounter: Payer: Medicaid Other | Admitting: Obstetrics and Gynecology

## 2020-10-22 ENCOUNTER — Ambulatory Visit: Payer: Medicaid Other | Attending: Obstetrics and Gynecology | Admitting: *Deleted

## 2020-10-22 ENCOUNTER — Ambulatory Visit (HOSPITAL_BASED_OUTPATIENT_CLINIC_OR_DEPARTMENT_OTHER): Payer: Medicaid Other

## 2020-10-22 ENCOUNTER — Other Ambulatory Visit: Payer: Self-pay

## 2020-10-22 DIAGNOSIS — O09292 Supervision of pregnancy with other poor reproductive or obstetric history, second trimester: Secondary | ICD-10-CM

## 2020-10-22 DIAGNOSIS — O3442 Maternal care for other abnormalities of cervix, second trimester: Secondary | ICD-10-CM

## 2020-10-22 DIAGNOSIS — O30042 Twin pregnancy, dichorionic/diamniotic, second trimester: Secondary | ICD-10-CM

## 2020-10-22 DIAGNOSIS — O0990 Supervision of high risk pregnancy, unspecified, unspecified trimester: Secondary | ICD-10-CM

## 2020-10-22 DIAGNOSIS — Z363 Encounter for antenatal screening for malformations: Secondary | ICD-10-CM | POA: Diagnosis not present

## 2020-10-22 DIAGNOSIS — O99212 Obesity complicating pregnancy, second trimester: Secondary | ICD-10-CM | POA: Diagnosis not present

## 2020-10-22 DIAGNOSIS — Z3A16 16 weeks gestation of pregnancy: Secondary | ICD-10-CM | POA: Insufficient documentation

## 2020-10-22 DIAGNOSIS — O322XX2 Maternal care for transverse and oblique lie, fetus 2: Secondary | ICD-10-CM

## 2020-10-22 DIAGNOSIS — E669 Obesity, unspecified: Secondary | ICD-10-CM

## 2020-10-22 DIAGNOSIS — O3432 Maternal care for cervical incompetence, second trimester: Secondary | ICD-10-CM | POA: Diagnosis not present

## 2020-10-22 DIAGNOSIS — O321XX1 Maternal care for breech presentation, fetus 1: Secondary | ICD-10-CM

## 2020-10-22 DIAGNOSIS — O30049 Twin pregnancy, dichorionic/diamniotic, unspecified trimester: Secondary | ICD-10-CM

## 2020-10-27 ENCOUNTER — Encounter: Payer: Medicaid Other | Admitting: Obstetrics and Gynecology

## 2020-11-01 ENCOUNTER — Inpatient Hospital Stay (HOSPITAL_COMMUNITY)
Admission: AD | Admit: 2020-11-01 | Discharge: 2020-11-01 | Disposition: A | Discharge status: Home / Self Care | Payer: Medicaid Other | Attending: Family Medicine | Admitting: Family Medicine

## 2020-11-01 ENCOUNTER — Inpatient Hospital Stay (HOSPITAL_BASED_OUTPATIENT_CLINIC_OR_DEPARTMENT_OTHER): Payer: Medicaid Other

## 2020-11-01 ENCOUNTER — Encounter (HOSPITAL_COMMUNITY): Payer: Self-pay | Admitting: Family Medicine

## 2020-11-01 ENCOUNTER — Other Ambulatory Visit: Payer: Self-pay

## 2020-11-01 DIAGNOSIS — O4690 Antepartum hemorrhage, unspecified, unspecified trimester: Secondary | ICD-10-CM

## 2020-11-01 DIAGNOSIS — O3432 Maternal care for cervical incompetence, second trimester: Secondary | ICD-10-CM | POA: Diagnosis not present

## 2020-11-01 DIAGNOSIS — O30009 Twin pregnancy, unspecified number of placenta and unspecified number of amniotic sacs, unspecified trimester: Secondary | ICD-10-CM

## 2020-11-01 DIAGNOSIS — O0990 Supervision of high risk pregnancy, unspecified, unspecified trimester: Secondary | ICD-10-CM

## 2020-11-01 DIAGNOSIS — O3442 Maternal care for other abnormalities of cervix, second trimester: Secondary | ICD-10-CM | POA: Diagnosis not present

## 2020-11-01 DIAGNOSIS — O322XX2 Maternal care for transverse and oblique lie, fetus 2: Secondary | ICD-10-CM

## 2020-11-01 DIAGNOSIS — Z3A18 18 weeks gestation of pregnancy: Secondary | ICD-10-CM | POA: Diagnosis not present

## 2020-11-01 DIAGNOSIS — O3430 Maternal care for cervical incompetence, unspecified trimester: Secondary | ICD-10-CM

## 2020-11-01 DIAGNOSIS — Z6790 Unspecified blood type, Rh positive: Secondary | ICD-10-CM | POA: Diagnosis not present

## 2020-11-01 DIAGNOSIS — O30042 Twin pregnancy, dichorionic/diamniotic, second trimester: Secondary | ICD-10-CM | POA: Diagnosis not present

## 2020-11-01 DIAGNOSIS — O99891 Other specified diseases and conditions complicating pregnancy: Secondary | ICD-10-CM | POA: Insufficient documentation

## 2020-11-01 DIAGNOSIS — Z363 Encounter for antenatal screening for malformations: Secondary | ICD-10-CM | POA: Diagnosis not present

## 2020-11-01 DIAGNOSIS — E669 Obesity, unspecified: Secondary | ICD-10-CM

## 2020-11-01 DIAGNOSIS — O4692 Antepartum hemorrhage, unspecified, second trimester: Secondary | ICD-10-CM

## 2020-11-01 DIAGNOSIS — O26852 Spotting complicating pregnancy, second trimester: Secondary | ICD-10-CM | POA: Insufficient documentation

## 2020-11-01 DIAGNOSIS — O99212 Obesity complicating pregnancy, second trimester: Secondary | ICD-10-CM | POA: Diagnosis not present

## 2020-11-01 DIAGNOSIS — O26892 Other specified pregnancy related conditions, second trimester: Secondary | ICD-10-CM | POA: Insufficient documentation

## 2020-11-01 DIAGNOSIS — M549 Dorsalgia, unspecified: Secondary | ICD-10-CM | POA: Insufficient documentation

## 2020-11-01 DIAGNOSIS — O321XX1 Maternal care for breech presentation, fetus 1: Secondary | ICD-10-CM | POA: Diagnosis not present

## 2020-11-01 DIAGNOSIS — Z7982 Long term (current) use of aspirin: Secondary | ICD-10-CM | POA: Insufficient documentation

## 2020-11-01 LAB — URINALYSIS, ROUTINE W REFLEX MICROSCOPIC
Bilirubin Urine: NEGATIVE
Glucose, UA: 50 mg/dL — AB
Ketones, ur: NEGATIVE mg/dL
Nitrite: NEGATIVE
Protein, ur: NEGATIVE mg/dL
Specific Gravity, Urine: 1.016 (ref 1.005–1.030)
pH: 6 (ref 5.0–8.0)

## 2020-11-01 LAB — WET PREP, GENITAL
Sperm: NONE SEEN
Trich, Wet Prep: NONE SEEN
Yeast Wet Prep HPF POC: NONE SEEN

## 2020-11-01 LAB — POCT FERN TEST: POCT Fern Test: NEGATIVE

## 2020-11-01 MED ORDER — ACETAMINOPHEN 500 MG PO TABS: 1000.0000 mg | ORAL_TABLET | Freq: Four times a day (QID) | ORAL | Status: DC | PRN

## 2020-11-01 NOTE — MAU Note (Signed)
Presents with c/o lower back, pelvic pressure and spotting.  Reports symptoms began last night.  Used heating pad for pain, didn't take any meds. Reports has cerclage since 15 weeks.

## 2020-11-01 NOTE — MAU Provider Note (Signed)
History     CSN: [id number]  Arrival date and time: 11/01/20 1307   Event Date/Time   First Provider Initiated Contact with Patient 11/01/20 1421      Chief Complaint  Patient presents with   Back Pain    other   Pelvic Pressure   Spotting   34 y.o. [id number] @18 .0 wks w/twins presenting with spotting and back pain. Reports onset of sx last night . States she was yelling and though that may have caused it. Spotting has been red and brown at times. Also had a Bergren amt of watery discharge last night, none today. No recent sex. Back pain is constant in lower back. Worse with standing, improves with lying down. Rates 9/10. Has not treated it. Denies urinary sx. Hx of cerclage placement at 13w.   OB History     Gravida  5   Para  3   Term  3   Preterm      AB  1   Living  3      SAB  1   IAB      Ectopic      Multiple      Live Births  3           Past Medical History:  Diagnosis Date   Anemia    Migraine    Vaginal Pap smear, abnormal     Past Surgical History:  Procedure Laterality Date   CERVICAL CERCLAGE     CERVICAL CERCLAGE N/A 10/08/2020   Procedure: CERCLAGE CERVICAL;  Surgeon: 10/10/2020, MD;  Location: [facility] LD ORS;  Service: Gynecology;  Laterality: N/A;   CERVICAL CONE BIOPSY      Family History  Problem Relation Age of Onset   Hypertension Mother    Diabetes Mother    Dementia Father     Social History   Tobacco Use   Smoking status: Never   Smokeless tobacco: Never  Vaping Use   Vaping Use: Never used  Substance Use Topics   Alcohol use: No    Alcohol/week: 0.0 standard drinks   Drug use: No    Allergies: No Known Allergies  No medications prior to admission.    Review of Systems  Gastrointestinal:  Negative for abdominal pain.  Genitourinary:  Positive for vaginal bleeding and vaginal discharge.  Musculoskeletal:  Positive for back pain.  Physical Exam   Blood pressure (!) 97/54, pulse 97, temperature 98 F  (36.7 C), temperature source Oral, resp. rate 17, height 5\' 9"  (1.753 m), weight 107.4 kg, last menstrual period 06/28/2020, SpO2 100 %.  Physical Exam Vitals and nursing note reviewed. Exam conducted with a chaperone present.  Constitutional:      General: She is not in acute distress.    Appearance: Normal appearance.  HENT:     Head: Normocephalic and atraumatic.  Cardiovascular:     Rate and Rhythm: Normal rate.  Pulmonary:     Effort: Pulmonary effort is normal. No respiratory distress.  Abdominal:     Palpations: Abdomen is soft.     Tenderness: There is no abdominal tenderness.  Genitourinary:    Comments: SSE: cerclage seen at 1 o'clock, appears normal, no pool, fern neg; thick white discharge and brown discharge; visually closed, no active bleeding Musculoskeletal:        General: Normal range of motion.     Cervical back: Normal range of motion.  Skin:    General: Skin is warm and dry.  Neurological:  General: No focal deficit present.     Mental Status: She is alert and oriented to person, place, and time.  Psychiatric:        Mood and Affect: Mood normal.        Behavior: Behavior normal.   Results for orders placed or performed during the hospital encounter of 11/01/20 (from the past 24 hour(s))  Urinalysis, Routine w reflex microscopic Urine, Clean Catch     Status: Abnormal   Collection Time: 11/01/20  1:42 PM  Result Value Ref Range   Color, Urine YELLOW YELLOW   APPearance HAZY (A) CLEAR   Specific Gravity, Urine 1.016 1.005 - 1.030   pH 6.0 5.0 - 8.0   Glucose, UA 50 (A) NEGATIVE mg/dL   Hgb urine dipstick MODERATE (A) NEGATIVE   Bilirubin Urine NEGATIVE NEGATIVE   Ketones, ur NEGATIVE NEGATIVE mg/dL   Protein, ur NEGATIVE NEGATIVE mg/dL   Nitrite NEGATIVE NEGATIVE   Leukocytes,Ua MODERATE (A) NEGATIVE   RBC / HPF 0-5 0 - 5 RBC/hpf   WBC, UA 11-20 0 - 5 WBC/hpf   Bacteria, UA RARE (A) NONE SEEN   Squamous Epithelial / LPF 11-20 0 - 5   Mucus  PRESENT   Wet prep, genital     Status: Abnormal   Collection Time: 11/01/20  2:37 PM   Specimen: Cervix  Result Value Ref Range   Yeast Wet Prep HPF POC NONE SEEN NONE SEEN   Trich, Wet Prep NONE SEEN NONE SEEN   Clue Cells Wet Prep HPF POC PRESENT (A) NONE SEEN   WBC, Wet Prep HPF POC MANY (A) NONE SEEN   Sperm NONE SEEN   POCT fern test     Status: None   Collection Time: 11/01/20  5:28 PM  Result Value Ref Range   POCT Fern Test Negative = intact amniotic membranes    [place]: normal AFV, normal FHR x2, CL 3.7cm. MAU Course  Procedures  MDM Labs ordered and reviewed. No signs of threatened SAB. Normal CL. Pt reassured. Stable for discharge home.   Assessment and Plan  [redacted] weeks gestation Twin gestation Spotting in pregnancy Discharge home Follow up at [facility] as scheduled Pelvic rest SAB precautions  Allergies as of 11/01/2020   No Known Allergies      Medication List     TAKE these medications    acetaminophen 325 MG tablet Commonly known as: TYLENOL Take 162.5 mg by mouth every 6 (six) hours as needed for headache or moderate pain.   aspirin EC 81 MG tablet Take 81 mg by mouth daily. Swallow whole.   Blood Pressure Monitor Automat Devi 1 Device by Does not apply route daily. Automatic blood pressure cuff regular size. To monitor blood pressure regularly at home. ICD-10 code: O09.90   Gojji Weight Scale Misc 1 Device by Does not apply route daily as needed. To weight self daily as needed at home. ICD-10 code: O09.90   Prenatal 27-1 MG Tabs Take 1 tablet by mouth daily at 6 (six) AM.          Donette Larry, CNM 11/01/2020, 5:28 PM

## 2020-11-03 LAB — GC/CHLAMYDIA PROBE AMP (~~LOC~~) NOT AT ARMC
Chlamydia: NEGATIVE
Comment: NEGATIVE
Comment: NORMAL
Neisseria Gonorrhea: NEGATIVE

## 2020-11-11 ENCOUNTER — Ambulatory Visit (HOSPITAL_BASED_OUTPATIENT_CLINIC_OR_DEPARTMENT_OTHER): Payer: Medicaid Other | Admitting: Obstetrics

## 2020-11-11 ENCOUNTER — Other Ambulatory Visit: Payer: Self-pay

## 2020-11-11 ENCOUNTER — Ambulatory Visit: Payer: Medicaid Other | Admitting: *Deleted

## 2020-11-11 ENCOUNTER — Encounter: Payer: Self-pay | Admitting: *Deleted

## 2020-11-11 ENCOUNTER — Other Ambulatory Visit: Payer: Self-pay | Admitting: *Deleted

## 2020-11-11 ENCOUNTER — Ambulatory Visit: Payer: Medicaid Other | Attending: Obstetrics & Gynecology

## 2020-11-11 VITALS — BP 97/84 | HR 99

## 2020-11-11 DIAGNOSIS — O0990 Supervision of high risk pregnancy, unspecified, unspecified trimester: Secondary | ICD-10-CM | POA: Diagnosis not present

## 2020-11-11 DIAGNOSIS — Z3A19 19 weeks gestation of pregnancy: Secondary | ICD-10-CM

## 2020-11-11 DIAGNOSIS — O09522 Supervision of elderly multigravida, second trimester: Secondary | ICD-10-CM

## 2020-11-11 DIAGNOSIS — O30042 Twin pregnancy, dichorionic/diamniotic, second trimester: Secondary | ICD-10-CM | POA: Insufficient documentation

## 2020-11-11 DIAGNOSIS — O3432 Maternal care for cervical incompetence, second trimester: Secondary | ICD-10-CM | POA: Diagnosis not present

## 2020-11-11 DIAGNOSIS — O30049 Twin pregnancy, dichorionic/diamniotic, unspecified trimester: Secondary | ICD-10-CM

## 2020-11-11 DIAGNOSIS — O30009 Twin pregnancy, unspecified number of placenta and unspecified number of amniotic sacs, unspecified trimester: Secondary | ICD-10-CM | POA: Insufficient documentation

## 2020-11-11 NOTE — Progress Notes (Signed)
MFM Note  This patient was seen due to a spontaneously conceived twin pregnancy and due to advanced maternal age.  She currently has a cervical cerclage in place.  She denies any significant past medical history and denies any problems in her current pregnancy.     The patient had a cell free DNA test earlier in her pregnancy which indicated a low risk for trisomy 3, 18, and 13.  These are predicted to be dizygotic/fraternal twins.  A female and female fetus are predicted.     A thick dividing membrane was noted separating the two fetuses, indicating that these are dichorionic, diamniotic twins.  The fetal growth and amniotic fluid level appeared appropriate for both twin A and twin B.    There were no obvious anomalies noted in either twin A or twin B today.  However the views of the fetal anatomy were limited today due to the fetal positions.  The limitations of ultrasound in the detection of all anomalies was discussed today.  The management of dichorionic twins was discussed.  She was advised that management of twin pregnancies will involve frequent ultrasound exams to assess the fetal growth and amniotic fluid level. We will continue to follow her with serial growth ultrasounds.  Weekly fetal testing for dichorionic twins should start at around 36 weeks.  Delivery for uncomplicated dichorionic twins should occur at around 38 weeks.  The increased risk of fetal aneuploidy due to advanced maternal age was discussed. Due to advanced maternal age, the patient was offered and declined an amniocentesis today for definitive diagnosis of fetal aneuploidy.  The increased risk of preeclampsia, gestational diabetes, and preterm birth/labor associated with twin pregnancies was discussed.  She was advised that she will continue to be followed closely to assess for these conditions. As pregnancies with multiple gestations are at increased risk for developing preeclampsia, she was advised to start taking a  daily baby aspirin (81 mg per day) to decrease her risk of developing preeclampsia  as soon as possible.  A follow-up exam was scheduled in 4 weeks to assess the fetal growth and to complete the views of the fetal anatomy.   A total of 20 minutes was spent counseling and coordinating the care for this patient.  Greater than 50% of the time was spent in direct face-to-face contact.

## 2020-11-12 ENCOUNTER — Telehealth: Payer: Self-pay

## 2020-11-12 NOTE — Telephone Encounter (Signed)
Sw patient and advised I was not supposed to allow the children to stay in waiting area at 11/11/2020 visit (they are too young)-explained to patient  for upcoming appointment- no children allowed, may have one visitor 13+;

## 2020-11-24 ENCOUNTER — Encounter: Payer: Self-pay | Admitting: Obstetrics and Gynecology

## 2020-11-24 ENCOUNTER — Ambulatory Visit (INDEPENDENT_AMBULATORY_CARE_PROVIDER_SITE_OTHER): Payer: Medicaid Other | Admitting: Obstetrics and Gynecology

## 2020-11-24 ENCOUNTER — Other Ambulatory Visit: Payer: Self-pay

## 2020-11-24 DIAGNOSIS — O09522 Supervision of elderly multigravida, second trimester: Secondary | ICD-10-CM

## 2020-11-24 DIAGNOSIS — O09529 Supervision of elderly multigravida, unspecified trimester: Secondary | ICD-10-CM | POA: Insufficient documentation

## 2020-11-24 DIAGNOSIS — O26879 Cervical shortening, unspecified trimester: Secondary | ICD-10-CM | POA: Insufficient documentation

## 2020-11-24 DIAGNOSIS — O30049 Twin pregnancy, dichorionic/diamniotic, unspecified trimester: Secondary | ICD-10-CM | POA: Insufficient documentation

## 2020-11-24 DIAGNOSIS — O0990 Supervision of high risk pregnancy, unspecified, unspecified trimester: Secondary | ICD-10-CM

## 2020-11-24 NOTE — Progress Notes (Signed)
   PRENATAL VISIT NOTE  Subjective:  Andrea Logan is a 35 y.o. [id number] at [redacted]w[redacted]d being seen today for ongoing prenatal care.  She is currently monitored for the following issues for this high-risk pregnancy and has Depression; Supervision of high risk pregnancy, antepartum; Alpha thalassemia silent carrier; Dichorionic diamniotic twin pregnancy, antepartum; Short cervix affecting pregnancy; and AMA (advanced maternal age) multigravida 35+ on their problem list.  Patient reports no complaints.  Contractions: Not present. Vag. Bleeding: None.  Movement: Present. Denies leaking of fluid.   The following portions of the patient's history were reviewed and updated as appropriate: allergies, current medications, past family history, past medical history, past social history, past surgical history and problem list.   Objective:   Vitals:   11/24/20 0923  BP: 114/72  Pulse: 89  Weight: 239 lb 9.6 oz (108.7 kg)    Fetal Status: Fetal Heart Rate (bpm): A 147, B 161   Movement: Present     General:  Alert, oriented and cooperative. Patient is in no acute distress.  Skin: Skin is warm and dry. No rash noted.   Cardiovascular: Normal heart rate noted  Respiratory: Normal respiratory effort, no problems with respiration noted  Abdomen: Soft, gravid, appropriate for gestational age.  Pain/Pressure: Absent     Pelvic: Cervical exam deferred        Extremities: Normal range of motion.  Edema: None  Mental Status: Normal mood and affect. Normal behavior. Normal judgment and thought content.   Assessment and Plan:  Pregnancy: [id number] at [redacted]w[redacted]d 1. Supervision of high risk pregnancy, antepartum Patient is doing well without complaints AFP pending  2. Dichorionic diamniotic twin pregnancy, antepartum Anatomy ultrasound reviewed with the patient Follow up growth scheduled  3. Short cervix affecting pregnancy S/p cerclage placement No complaints  4. Multigravida of advanced maternal age in second  trimester Continue ASA  Preterm labor symptoms and general obstetric precautions including but not limited to vaginal bleeding, contractions, leaking of fluid and fetal movement were reviewed in detail with the patient. Please refer to After Visit Summary for other counseling recommendations.   Return in about 4 weeks (around 12/22/2020) for in person, ROB, High risk.  Future Appointments  Date Time Provider Department Center  12/09/2020  9:30 AM [facility] NURSE [facility]  12/09/2020  9:45 AM [facility] US4 [facility]  12/22/2020  8:15 AM Adam Phenix, MD [facility] None    Catalina Antigua, MD

## 2020-11-24 NOTE — Progress Notes (Signed)
ROB 21.2 wks, Twins No complaints.

## 2020-12-05 ENCOUNTER — Telehealth: Payer: Self-pay | Admitting: *Deleted

## 2020-12-05 NOTE — Telephone Encounter (Signed)
Received a voicemail stating she is calling about her PNV. States she has been contacted that it is not covered by her insurance and 3 different [facility]. Wants to know if we can please help her get  her PNV.  Per chart is pt at [facility]. Will route to that office. Cristen Murcia,RN

## 2020-12-07 ENCOUNTER — Emergency Department (HOSPITAL_COMMUNITY)
Admission: EM | Admit: 2020-12-07 | Discharge: 2020-12-08 | Disposition: A | Discharge status: Home / Self Care | Payer: Medicaid Other | Attending: Emergency Medicine | Admitting: Emergency Medicine

## 2020-12-07 DIAGNOSIS — Z5321 Procedure and treatment not carried out due to patient leaving prior to being seen by health care provider: Secondary | ICD-10-CM | POA: Insufficient documentation

## 2020-12-07 DIAGNOSIS — H5711 Ocular pain, right eye: Secondary | ICD-10-CM | POA: Insufficient documentation

## 2020-12-08 NOTE — ED Triage Notes (Signed)
Pt was taking her artifical eyelashes off tonight and is now having R eye pain with redness and irritation. Pt took two tylenol with improvement

## 2020-12-08 NOTE — ED Notes (Signed)
Pt upset about the wait time and left

## 2020-12-09 ENCOUNTER — Ambulatory Visit: Payer: Medicaid Other | Attending: Obstetrics

## 2020-12-09 ENCOUNTER — Ambulatory Visit: Payer: Medicaid Other | Admitting: *Deleted

## 2020-12-09 ENCOUNTER — Other Ambulatory Visit: Payer: Self-pay | Admitting: *Deleted

## 2020-12-09 ENCOUNTER — Other Ambulatory Visit: Payer: Self-pay

## 2020-12-09 ENCOUNTER — Other Ambulatory Visit: Payer: Self-pay | Admitting: Obstetrics

## 2020-12-09 ENCOUNTER — Encounter: Payer: Self-pay | Admitting: *Deleted

## 2020-12-09 VITALS — BP 107/57 | HR 97

## 2020-12-09 DIAGNOSIS — O09522 Supervision of elderly multigravida, second trimester: Secondary | ICD-10-CM

## 2020-12-09 DIAGNOSIS — O0990 Supervision of high risk pregnancy, unspecified, unspecified trimester: Secondary | ICD-10-CM

## 2020-12-09 DIAGNOSIS — O3432 Maternal care for cervical incompetence, second trimester: Secondary | ICD-10-CM | POA: Diagnosis not present

## 2020-12-09 DIAGNOSIS — Z362 Encounter for other antenatal screening follow-up: Secondary | ICD-10-CM

## 2020-12-09 DIAGNOSIS — Z3A23 23 weeks gestation of pregnancy: Secondary | ICD-10-CM | POA: Diagnosis not present

## 2020-12-09 DIAGNOSIS — O30049 Twin pregnancy, dichorionic/diamniotic, unspecified trimester: Secondary | ICD-10-CM | POA: Diagnosis present

## 2020-12-09 DIAGNOSIS — O321XX1 Maternal care for breech presentation, fetus 1: Secondary | ICD-10-CM

## 2020-12-09 DIAGNOSIS — O26879 Cervical shortening, unspecified trimester: Secondary | ICD-10-CM | POA: Diagnosis present

## 2020-12-09 DIAGNOSIS — O99212 Obesity complicating pregnancy, second trimester: Secondary | ICD-10-CM | POA: Diagnosis not present

## 2020-12-09 DIAGNOSIS — E669 Obesity, unspecified: Secondary | ICD-10-CM | POA: Diagnosis not present

## 2020-12-09 DIAGNOSIS — O30042 Twin pregnancy, dichorionic/diamniotic, second trimester: Secondary | ICD-10-CM

## 2020-12-11 MED ORDER — VITAFOL ULTRA 29-0.6-0.4-200 MG PO CAPS: 1.0000 | ORAL_CAPSULE | Freq: Every day | ORAL | 12 refills | Status: DC

## 2020-12-11 NOTE — Addendum Note (Signed)
Addended by: Catalina Antigua on: 12/11/2020 04:34 PM   Modules accepted: Orders

## 2020-12-22 ENCOUNTER — Ambulatory Visit (INDEPENDENT_AMBULATORY_CARE_PROVIDER_SITE_OTHER): Payer: Medicaid Other | Admitting: Obstetrics & Gynecology

## 2020-12-22 ENCOUNTER — Encounter: Payer: Self-pay | Admitting: Obstetrics & Gynecology

## 2020-12-22 ENCOUNTER — Other Ambulatory Visit: Payer: Self-pay

## 2020-12-22 VITALS — BP 118/76 | HR 111 | Wt 246.0 lb

## 2020-12-22 DIAGNOSIS — O0990 Supervision of high risk pregnancy, unspecified, unspecified trimester: Secondary | ICD-10-CM

## 2020-12-22 DIAGNOSIS — O30049 Twin pregnancy, dichorionic/diamniotic, unspecified trimester: Secondary | ICD-10-CM

## 2020-12-22 DIAGNOSIS — O26879 Cervical shortening, unspecified trimester: Secondary | ICD-10-CM

## 2020-12-22 MED ORDER — COMFORT FIT MATERNITY SUPP LG MISC: 1.0000 [IU] | Freq: Every day | 0 refills | Status: DC

## 2020-12-22 MED ORDER — PANTOPRAZOLE SODIUM 40 MG PO TBEC: 40.0000 mg | DELAYED_RELEASE_TABLET | Freq: Every day | ORAL | 1 refills | Status: DC

## 2020-12-22 MED ORDER — DOCUSATE SODIUM 100 MG PO CAPS: 100.0000 mg | ORAL_CAPSULE | Freq: Two times a day (BID) | ORAL | 2 refills | Status: DC | PRN

## 2020-12-22 NOTE — Progress Notes (Signed)
   PRENATAL VISIT NOTE  Subjective:  Andrea Logan is a 35 y.o. [id number] at [redacted]w[redacted]d being seen today for ongoing prenatal care.  She is currently monitored for the following issues for this high-risk pregnancy and has Depression; Supervision of high risk pregnancy, antepartum; Alpha thalassemia silent carrier; Dichorionic diamniotic twin pregnancy, antepartum; Short cervix affecting pregnancy; and AMA (advanced maternal age) multigravida 35+ on their problem list.  Patient reports heartburn and nausea.  Contractions: Irritability. Vag. Bleeding: None.  Movement: Present. Denies leaking of fluid.   The following portions of the patient's history were reviewed and updated as appropriate: allergies, current medications, past family history, past medical history, past social history, past surgical history and problem list.   Objective:   Vitals:   12/22/20 0827  BP: 118/76  Pulse: (!) 111  Weight: 246 lb (111.6 kg)    Fetal Status: Fetal Heart Rate (bpm): A 159 B 153   Movement: Present     General:  Alert, oriented and cooperative. Patient is in no acute distress.  Skin: Skin is warm and dry. No rash noted.   Cardiovascular: Normal heart rate noted  Respiratory: Normal respiratory effort, no problems with respiration noted  Abdomen: Soft, gravid, appropriate for gestational age.  Pain/Pressure: Present     Pelvic: Cervical exam deferred        Extremities: Normal range of motion.  Edema: None  Mental Status: Normal mood and affect. Normal behavior. Normal judgment and thought content.   Assessment and Plan:  Pregnancy: [id number] at [redacted]w[redacted]d 1. Supervision of high risk pregnancy, antepartum Constipation nausea HB  - docusate sodium (COLACE) 100 MG capsule; Take 1 capsule (100 mg total) by mouth 2 (two) times daily as needed.  Dispense: 30 capsule; Refill: 2 - pantoprazole (PROTONIX) 40 MG tablet; Take 1 tablet (40 mg total) by mouth daily.  Dispense: 30 tablet; Refill: 1 - Elastic Bandages &  Supports (COMFORT FIT MATERNITY SUPP LG) MISC; 1 Units by Does not apply route daily.  Dispense: 1 each; Refill: 0  2. Dichorionic diamniotic twin pregnancy, antepartum F/u US  3. Short cervix affecting pregnancy Cerclage and short cervix  Preterm labor symptoms and general obstetric precautions including but not limited to vaginal bleeding, contractions, leaking of fluid and fetal movement were reviewed in detail with the patient. Please refer to After Visit Summary for other counseling recommendations.   Return in about 2 weeks (around 01/05/2021) for 2 hr GTT.  Future Appointments  Date Time Provider Department Center  01/05/2021  8:15 AM [facility] None  01/05/2021  8:35 AM Milas Hock, MD [facility] None  01/06/2021  8:15 AM [facility] NURSE [facility]  01/06/2021  8:30 AM [facility] US3 [facility]    Scheryl Darter, MD

## 2021-01-05 ENCOUNTER — Encounter: Payer: Self-pay | Admitting: Obstetrics and Gynecology

## 2021-01-05 ENCOUNTER — Ambulatory Visit (INDEPENDENT_AMBULATORY_CARE_PROVIDER_SITE_OTHER): Payer: Medicaid Other | Admitting: Obstetrics and Gynecology

## 2021-01-05 ENCOUNTER — Other Ambulatory Visit: Payer: Self-pay

## 2021-01-05 ENCOUNTER — Other Ambulatory Visit: Payer: Medicaid Other

## 2021-01-05 VITALS — BP 117/80 | HR 111 | Wt 244.0 lb

## 2021-01-05 DIAGNOSIS — O0990 Supervision of high risk pregnancy, unspecified, unspecified trimester: Secondary | ICD-10-CM | POA: Diagnosis not present

## 2021-01-05 DIAGNOSIS — Z23 Encounter for immunization: Secondary | ICD-10-CM | POA: Diagnosis not present

## 2021-01-05 DIAGNOSIS — F32A Depression, unspecified: Secondary | ICD-10-CM

## 2021-01-05 DIAGNOSIS — O99343 Other mental disorders complicating pregnancy, third trimester: Secondary | ICD-10-CM

## 2021-01-05 DIAGNOSIS — O0993 Supervision of high risk pregnancy, unspecified, third trimester: Secondary | ICD-10-CM

## 2021-01-05 MED ORDER — VITAFOL GUMMIES 3.33-0.333-34.8 MG PO CHEW: 1.0000 | CHEWABLE_TABLET | Freq: Every day | ORAL | 5 refills | Status: DC

## 2021-01-05 NOTE — Progress Notes (Signed)
   PRENATAL VISIT NOTE  Subjective:  Andrea Logan is a 35 y.o. [id number] at [redacted]w[redacted]d being seen today for ongoing prenatal care.  She is currently monitored for the following issues for this high-risk pregnancy and has Depression; Supervision of high risk pregnancy, antepartum; Alpha thalassemia silent carrier; Dichorionic diamniotic twin pregnancy, antepartum; Short cervix affecting pregnancy; and AMA (advanced maternal age) multigravida 35+ on their problem list.  Patient reports  depression symptoms  - she is screen positive. She  would like referral to [facility] with behavioral health.  Contractions: Irritability. Vag. Bleeding: None.  Movement: Present. Denies leaking of fluid.   The following portions of the patient's history were reviewed and updated as appropriate: allergies, current medications, past family history, past medical history, past social history, past surgical history and problem list.   Objective:   Vitals:   01/05/21 0856  BP: 117/80  Pulse: (!) 111  Weight: 244 lb (110.7 kg)    Fetal Status:     Movement: Present     General:  Alert, oriented and cooperative. Patient is in no acute distress.  Skin: Skin is warm and dry. No rash noted.   Cardiovascular: Normal heart rate noted  Respiratory: Normal respiratory effort, no problems with respiration noted  Abdomen: Soft, gravid, appropriate for gestational age.  Pain/Pressure: Present     Pelvic: Cervical exam deferred        Extremities: Normal range of motion.  Edema: None  Mental Status: Normal mood and affect. Normal behavior. Normal judgment and thought content.   Assessment and Plan:  Pregnancy: G5P3013 at [redacted]w[redacted]d 1. Supervision of high risk pregnancy, antepartum - Rh positive, no indication for rhogam - Glucose Tolerance, 2 Hours w/1 Hour - HIV Antibody (routine testing w rflx) - RPR - CBC - Ambulatory referral to Integrated Behavioral Health - Prenatal Vit-Fe Phos-FA-Omega (VITAFOL GUMMIES) 3.33-0.333-34.8 MG  CHEW; Chew 1 tablet by mouth daily.  Dispense: 90 tablet; Refill: 5 - She would like TDAP next time.   2. Depression affecting pregnancy in third trimester, antepartum - Ambulatory referral to Integrated Behavioral Health - Reviewed indications for medical therapy  3. Cerclage in place  4. Didi twins - Next growth is tomorrow     Preterm labor symptoms and general obstetric precautions including but not limited to vaginal bleeding, contractions, leaking of fluid and fetal movement were reviewed in detail with the patient. Please refer to After Visit Summary for other counseling recommendations.   No follow-ups on file.  Future Appointments  Date Time Provider Department Center  01/06/2021  8:15 AM [facility] NURSE [facility]  01/06/2021  8:30 AM [facility] US3 [facility]    Milas Hock, MD

## 2021-01-05 NOTE — Progress Notes (Signed)
Pt presents for ROB and 2 gtts labs.  Pt agrees to behavior health referral.  PHQ9 = 9 GAD7 = 14

## 2021-01-06 ENCOUNTER — Encounter: Payer: Self-pay | Admitting: *Deleted

## 2021-01-06 ENCOUNTER — Other Ambulatory Visit: Payer: Self-pay | Admitting: Maternal & Fetal Medicine

## 2021-01-06 ENCOUNTER — Encounter: Payer: Self-pay | Admitting: Obstetrics

## 2021-01-06 ENCOUNTER — Other Ambulatory Visit (HOSPITAL_COMMUNITY)
Admission: RE | Admit: 2021-01-06 | Discharge: 2021-01-06 | Disposition: A | Discharge status: Home / Self Care | Payer: Medicaid Other | Source: Ambulatory Visit | Attending: Obstetrics | Admitting: Obstetrics

## 2021-01-06 ENCOUNTER — Ambulatory Visit: Payer: Medicaid Other | Attending: Obstetrics and Gynecology

## 2021-01-06 ENCOUNTER — Ambulatory Visit: Payer: Medicaid Other | Admitting: *Deleted

## 2021-01-06 ENCOUNTER — Ambulatory Visit (INDEPENDENT_AMBULATORY_CARE_PROVIDER_SITE_OTHER): Payer: Medicaid Other | Admitting: Obstetrics

## 2021-01-06 VITALS — BP 127/69 | HR 101 | Wt 246.1 lb

## 2021-01-06 VITALS — BP 111/58 | HR 105

## 2021-01-06 DIAGNOSIS — Z362 Encounter for other antenatal screening follow-up: Secondary | ICD-10-CM

## 2021-01-06 DIAGNOSIS — O30049 Twin pregnancy, dichorionic/diamniotic, unspecified trimester: Secondary | ICD-10-CM | POA: Diagnosis present

## 2021-01-06 DIAGNOSIS — Z3A27 27 weeks gestation of pregnancy: Secondary | ICD-10-CM | POA: Diagnosis not present

## 2021-01-06 DIAGNOSIS — O99212 Obesity complicating pregnancy, second trimester: Secondary | ICD-10-CM

## 2021-01-06 DIAGNOSIS — N898 Other specified noninflammatory disorders of vagina: Secondary | ICD-10-CM | POA: Insufficient documentation

## 2021-01-06 DIAGNOSIS — O26879 Cervical shortening, unspecified trimester: Secondary | ICD-10-CM | POA: Insufficient documentation

## 2021-01-06 DIAGNOSIS — O3432 Maternal care for cervical incompetence, second trimester: Secondary | ICD-10-CM | POA: Diagnosis not present

## 2021-01-06 DIAGNOSIS — O30042 Twin pregnancy, dichorionic/diamniotic, second trimester: Secondary | ICD-10-CM | POA: Insufficient documentation

## 2021-01-06 DIAGNOSIS — O09292 Supervision of pregnancy with other poor reproductive or obstetric history, second trimester: Secondary | ICD-10-CM | POA: Diagnosis not present

## 2021-01-06 DIAGNOSIS — O0990 Supervision of high risk pregnancy, unspecified, unspecified trimester: Secondary | ICD-10-CM

## 2021-01-06 DIAGNOSIS — E669 Obesity, unspecified: Secondary | ICD-10-CM | POA: Diagnosis not present

## 2021-01-06 DIAGNOSIS — O09522 Supervision of elderly multigravida, second trimester: Secondary | ICD-10-CM

## 2021-01-06 LAB — CBC
Hematocrit: 32.1 % — ABNORMAL LOW (ref 34.0–46.6)
Hemoglobin: 10.8 g/dL — ABNORMAL LOW (ref 11.1–15.9)
MCH: 28.6 pg (ref 26.6–33.0)
MCHC: 33.6 g/dL (ref 31.5–35.7)
MCV: 85 fL (ref 79–97)
Platelets: 195 10*3/uL (ref 150–450)
RBC: 3.78 x10E6/uL (ref 3.77–5.28)
RDW: 12.2 % (ref 11.7–15.4)
WBC: 10.4 10*3/uL (ref 3.4–10.8)

## 2021-01-06 LAB — RPR: RPR Ser Ql: NONREACTIVE

## 2021-01-06 LAB — HIV ANTIBODY (ROUTINE TESTING W REFLEX): HIV Screen 4th Generation wRfx: NONREACTIVE

## 2021-01-06 LAB — GLUCOSE TOLERANCE, 2 HOURS W/ 1HR
Glucose, 1 hour: 101 mg/dL (ref 65–179)
Glucose, 2 hour: 95 mg/dL (ref 65–152)
Glucose, Fasting: 87 mg/dL (ref 65–91)

## 2021-01-06 MED ORDER — METRONIDAZOLE 500 MG PO TABS: 500.0000 mg | ORAL_TABLET | Freq: Two times a day (BID) | ORAL | 2 refills | Status: DC

## 2021-01-06 MED ORDER — FOLIC ACID 1 MG PO TABS: 1.0000 mg | ORAL_TABLET | Freq: Every day | ORAL | 4 refills | Status: DC

## 2021-01-06 MED ORDER — VITAFOL ULTRA 29-0.6-0.4-200 MG PO CAPS: 1.0000 | ORAL_CAPSULE | Freq: Every day | ORAL | 4 refills | Status: DC

## 2021-01-06 NOTE — Progress Notes (Signed)
Subjective:  Andrea Logan is a 35 y.o. [id number] at [redacted]w[redacted]d being seen today for ongoing prenatal care.  She is currently monitored for the following issues for this high-risk pregnancy and has Depression; Supervision of high risk pregnancy, antepartum; Alpha thalassemia silent carrier; Dichorionic diamniotic twin pregnancy, antepartum; Short cervix affecting pregnancy; and AMA (advanced maternal age) multigravida 35+ on their problem list.  Patient reports  yellowish-green profuse vaginal discharge .  Contractions: Irritability. Vag. Bleeding: None.  Movement: Present. Denies leaking of fluid.   The following portions of the patient's history were reviewed and updated as appropriate: allergies, current medications, past family history, past medical history, past social history, past surgical history and problem list. Problem list updated.  Objective:   Vitals:   01/06/21 1515  BP: 127/69  Pulse: (!) 101  Weight: 246 lb 1.6 oz (111.6 kg)    Fetal Status:     Movement: Present     General:  Alert, oriented and cooperative. Patient is in no acute distress.  Skin: Skin is warm and dry. No rash noted.   Cardiovascular: Normal heart rate noted  Respiratory: Normal respiratory effort, no problems with respiration noted  Abdomen: Soft, gravid, appropriate for gestational age. Pain/Pressure: Present     Pelvic:  Cervical exam performed     Cvx:  Long / closed / stitch intact.  Moderate amount of yellow-green frothy discharge,  Wet prep done.   Extremities: Normal range of motion.  Edema: None  Mental Status: Normal mood and affect. Normal behavior. Normal judgment and thought content.   Urinalysis:      Assessment and Plan:  Pregnancy: O2U2353 at [redacted]w[redacted]d  1. Supervision of high risk pregnancy, antepartum  2. Dichorionic diamniotic twin pregnancy, antepartum Rx: - Prenat-Fe Poly-Methfol-FA-DHA (VITAFOL ULTRA) 29-0.6-0.4-200 MG CAPS; Take 1 tablet by mouth daily.  Dispense: 90 capsule; Refill:  4 - folic acid (FOLVITE) 1 MG tablet; Take 1 tablet (1 mg total) by mouth daily.  Dispense: 90 tablet; Refill: 4  3. 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  Dispense: 14 tablet; Refill: 2  - patient cautioned to avoid sexual intercourse   Preterm labor symptoms and general obstetric precautions including but not limited to vaginal bleeding, contractions, leaking of fluid and fetal movement were reviewed in detail with the patient. Please refer to After Visit Summary for other counseling recommendations.   Return in about 2 weeks (around 01/20/2021) for [facility].   Brock Bad, MD  01/06/21

## 2021-01-06 NOTE — Progress Notes (Signed)
Reports vaginal discharge, was white, now yellow/green. Sts it continues to come out and "mess up my panties". Wearing panty liner and states it is getting soaked.

## 2021-01-07 ENCOUNTER — Other Ambulatory Visit: Payer: Self-pay | Admitting: Obstetrics

## 2021-01-07 LAB — CERVICOVAGINAL ANCILLARY ONLY
Bacterial Vaginitis (gardnerella): POSITIVE — AB
Candida Glabrata: NEGATIVE
Candida Vaginitis: NEGATIVE
Chlamydia: NEGATIVE
Comment: NEGATIVE
Comment: NEGATIVE
Comment: NEGATIVE
Comment: NEGATIVE
Comment: NEGATIVE
Comment: NORMAL
Neisseria Gonorrhea: NEGATIVE
Trichomonas: POSITIVE — AB

## 2021-01-07 MED ORDER — FERROUS SULFATE 325 (65 FE) MG PO TABS: 325.0000 mg | ORAL_TABLET | ORAL | 3 refills | Status: DC

## 2021-01-07 NOTE — Addendum Note (Signed)
Addended by: Milas Hock A on: 01/07/2021 09:30 AM   Modules accepted: Orders

## 2021-01-20 ENCOUNTER — Institutional Professional Consult (permissible substitution): Payer: Medicaid Other | Admitting: Licensed Clinical Social Worker

## 2021-01-20 ENCOUNTER — Encounter: Payer: Medicaid Other | Admitting: Obstetrics & Gynecology

## 2021-01-27 ENCOUNTER — Ambulatory Visit (INDEPENDENT_AMBULATORY_CARE_PROVIDER_SITE_OTHER): Payer: Medicaid Other | Admitting: Licensed Clinical Social Worker

## 2021-01-27 ENCOUNTER — Ambulatory Visit (INDEPENDENT_AMBULATORY_CARE_PROVIDER_SITE_OTHER): Payer: Medicaid Other | Admitting: Obstetrics & Gynecology

## 2021-01-27 ENCOUNTER — Other Ambulatory Visit: Payer: Self-pay

## 2021-01-27 VITALS — BP 95/67 | HR 114 | Wt 247.0 lb

## 2021-01-27 DIAGNOSIS — O99343 Other mental disorders complicating pregnancy, third trimester: Secondary | ICD-10-CM

## 2021-01-27 DIAGNOSIS — F32A Depression, unspecified: Secondary | ICD-10-CM | POA: Diagnosis not present

## 2021-01-27 DIAGNOSIS — O09523 Supervision of elderly multigravida, third trimester: Secondary | ICD-10-CM

## 2021-01-27 DIAGNOSIS — O30049 Twin pregnancy, dichorionic/diamniotic, unspecified trimester: Secondary | ICD-10-CM

## 2021-01-27 DIAGNOSIS — O26879 Cervical shortening, unspecified trimester: Secondary | ICD-10-CM

## 2021-01-27 DIAGNOSIS — O0990 Supervision of high risk pregnancy, unspecified, unspecified trimester: Secondary | ICD-10-CM

## 2021-01-27 NOTE — Progress Notes (Signed)
HROB TWINS c/o pressure, wants to know if she is taking too much Iron.

## 2021-01-27 NOTE — Progress Notes (Signed)
   PRENATAL VISIT NOTE  Subjective:  Andrea Logan is a 35 y.o. [id number] at [redacted]w[redacted]d being seen today for ongoing prenatal care.  She is currently monitored for the following issues for this high-risk pregnancy and has Depression; Supervision of high risk pregnancy, antepartum; Alpha thalassemia silent carrier; Dichorionic diamniotic twin pregnancy, antepartum; Short cervix affecting pregnancy; and AMA (advanced maternal age) multigravida 35+ on their problem list.  Patient reports  pelvic pressure .  Contractions: Irritability. Vag. Bleeding: None.  Movement: Present. Denies leaking of fluid.   The following portions of the patient's history were reviewed and updated as appropriate: allergies, current medications, past family history, past medical history, past social history, past surgical history and problem list.   Objective:   Vitals:   01/27/21 1041  BP: 95/67  Pulse: (!) 114  Weight: 247 lb (112 kg)    Fetal Status: Fetal Heart Rate (bpm): A:155 B:156 Fundal Height: 40 cm Movement: Present     General:  Alert, oriented and cooperative. Patient is in no acute distress.  Skin: Skin is warm and dry. No rash noted.   Cardiovascular: Normal heart rate noted  Respiratory: Normal respiratory effort, no problems with respiration noted  Abdomen: Soft, gravid, appropriate for gestational age.  Pain/Pressure: Present     Pelvic: Cervical exam deferred        Extremities: Normal range of motion.  Edema: Trace  Mental Status: Normal mood and affect. Normal behavior. Normal judgment and thought content.   Assessment and Plan:  Pregnancy: N3Z7673 at [redacted]w[redacted]d 1. Supervision of high risk pregnancy, antepartum F/u US next week  2. Dichorionic diamniotic twin pregnancy, antepartum Concordant growth  3. Multigravida of advanced maternal age in third trimester   4. Short cervix affecting pregnancy Cerclage in place  Preterm labor symptoms and general obstetric precautions including but not  limited to vaginal bleeding, contractions, leaking of fluid and fetal movement were reviewed in detail with the patient. Please refer to After Visit Summary for other counseling recommendations.   Return in about 2 weeks (around 02/10/2021).  Future Appointments  Date Time Provider Department Center  02/03/2021 12:30 PM [facility] NURSE [facility]  02/03/2021 12:45 PM [facility] US5 [facility]    Scheryl Darter, MD

## 2021-01-28 NOTE — BH Specialist Note (Signed)
Integrated Behavioral Health Initial In-Person Visit  MRN: [id number] Name: Andrea Logan  Number of Integrated Behavioral Health Clinician visits:: 1/6 Session Start time: 9:47am  Session End time: 10:12am Total time: 25 mins in person at [facility]   Types of Service: General Behavioral Integrated Care (BHI)  Interpretor:No. Interpretor Name and Language: None    Warm Hand Off Completed.        Subjective: Andrea Logan is a 35 y.o. female accompanied by daughter  Patient was referred by P. Para March MD for depression. Patient reports the following symptoms/concerns: depressed mood and lack of social support  Duration of problem: ovjer one year ; Severity of problem: mild  Objective: Mood: Depressed and Affect: Appropriate Risk of harm to self or others: No plan to harm self or others  Life Context: Family and Social: Lives with children in [place]  School/Work: Work at home for [employer] Self-Care: n/a Life Changes: pregnant with twins   Patient and/or Family's Strengths/Protective Factors: Concrete supports in place (healthy food, safe environments, etc.)  Goals Addressed: Patient will: Reduce symptoms of: depression Increase knowledge and/or ability of: coping skills and stress reduction  Demonstrate ability to: Increase healthy adjustment to current life circumstances and Increase adequate support systems for patient/family  Progress towards Goals: Ongoing  Interventions: Interventions utilized: Supportive Counseling  Standardized Assessments completed: PHQ 9  Patient and/or Family Response: [name] reports she does not social or family support to assist with parenting and home responsibilities. 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 [name] reports she is overwhelmed and expresses feeling of guilt.    Assessment: Patient currently experiencing depression affecting pregnancy.   Patient may benefit from integrated behavioral  health.  Plan: Follow up with behavioral health clinician on : 3-4 weeks  Behavioral recommendations: prioritize rest, get older children involved in activities to create time for self care, develop co parenting plan with father of twins, engage in self care  Referral(s): Integrated [facility] (In Clinic) "From scale of 1-10, how likely are you to follow plan?":   Gwyndolyn Saxon, LCSW

## 2021-02-03 ENCOUNTER — Encounter: Payer: Self-pay | Admitting: *Deleted

## 2021-02-03 ENCOUNTER — Ambulatory Visit: Payer: Medicaid Other | Admitting: *Deleted

## 2021-02-03 ENCOUNTER — Other Ambulatory Visit: Payer: Self-pay

## 2021-02-03 ENCOUNTER — Other Ambulatory Visit: Payer: Self-pay | Admitting: *Deleted

## 2021-02-03 ENCOUNTER — Ambulatory Visit: Payer: Medicaid Other | Attending: Maternal & Fetal Medicine

## 2021-02-03 VITALS — BP 104/76 | HR 106

## 2021-02-03 DIAGNOSIS — O26879 Cervical shortening, unspecified trimester: Secondary | ICD-10-CM

## 2021-02-03 DIAGNOSIS — O30043 Twin pregnancy, dichorionic/diamniotic, third trimester: Secondary | ICD-10-CM

## 2021-02-03 DIAGNOSIS — Z3A31 31 weeks gestation of pregnancy: Secondary | ICD-10-CM

## 2021-02-03 DIAGNOSIS — O99212 Obesity complicating pregnancy, second trimester: Secondary | ICD-10-CM | POA: Diagnosis present

## 2021-02-03 DIAGNOSIS — O3432 Maternal care for cervical incompetence, second trimester: Secondary | ICD-10-CM | POA: Diagnosis present

## 2021-02-03 DIAGNOSIS — O99213 Obesity complicating pregnancy, third trimester: Secondary | ICD-10-CM

## 2021-02-03 DIAGNOSIS — O30049 Twin pregnancy, dichorionic/diamniotic, unspecified trimester: Secondary | ICD-10-CM | POA: Insufficient documentation

## 2021-02-03 DIAGNOSIS — O0990 Supervision of high risk pregnancy, unspecified, unspecified trimester: Secondary | ICD-10-CM

## 2021-02-03 DIAGNOSIS — O09523 Supervision of elderly multigravida, third trimester: Secondary | ICD-10-CM | POA: Insufficient documentation

## 2021-02-03 DIAGNOSIS — O3433 Maternal care for cervical incompetence, third trimester: Secondary | ICD-10-CM | POA: Diagnosis not present

## 2021-02-03 DIAGNOSIS — O30042 Twin pregnancy, dichorionic/diamniotic, second trimester: Secondary | ICD-10-CM

## 2021-02-03 DIAGNOSIS — O09522 Supervision of elderly multigravida, second trimester: Secondary | ICD-10-CM | POA: Diagnosis not present

## 2021-02-10 ENCOUNTER — Ambulatory Visit: Payer: Medicaid Other | Admitting: Licensed Clinical Social Worker

## 2021-02-10 ENCOUNTER — Ambulatory Visit (INDEPENDENT_AMBULATORY_CARE_PROVIDER_SITE_OTHER): Payer: Medicaid Other | Admitting: Obstetrics and Gynecology

## 2021-02-10 ENCOUNTER — Ambulatory Visit (INDEPENDENT_AMBULATORY_CARE_PROVIDER_SITE_OTHER): Payer: Medicaid Other | Admitting: Licensed Clinical Social Worker

## 2021-02-10 ENCOUNTER — Other Ambulatory Visit: Payer: Self-pay

## 2021-02-10 ENCOUNTER — Ambulatory Visit: Payer: Medicaid Other

## 2021-02-10 VITALS — BP 105/63 | HR 115 | Wt 246.0 lb

## 2021-02-10 DIAGNOSIS — R000 Tachycardia, unspecified: Secondary | ICD-10-CM

## 2021-02-10 DIAGNOSIS — D563 Thalassemia minor: Secondary | ICD-10-CM

## 2021-02-10 DIAGNOSIS — F32A Depression, unspecified: Secondary | ICD-10-CM

## 2021-02-10 DIAGNOSIS — O3433 Maternal care for cervical incompetence, third trimester: Secondary | ICD-10-CM | POA: Insufficient documentation

## 2021-02-10 DIAGNOSIS — O99343 Other mental disorders complicating pregnancy, third trimester: Secondary | ICD-10-CM | POA: Diagnosis not present

## 2021-02-10 DIAGNOSIS — O0990 Supervision of high risk pregnancy, unspecified, unspecified trimester: Secondary | ICD-10-CM

## 2021-02-10 DIAGNOSIS — O30049 Twin pregnancy, dichorionic/diamniotic, unspecified trimester: Secondary | ICD-10-CM

## 2021-02-10 DIAGNOSIS — O26879 Cervical shortening, unspecified trimester: Secondary | ICD-10-CM

## 2021-02-10 DIAGNOSIS — O09523 Supervision of elderly multigravida, third trimester: Secondary | ICD-10-CM

## 2021-02-10 DIAGNOSIS — Z3A32 32 weeks gestation of pregnancy: Secondary | ICD-10-CM

## 2021-02-10 NOTE — Progress Notes (Signed)
HROB, c/o lightheadedness.

## 2021-02-10 NOTE — Progress Notes (Signed)
   PRENATAL VISIT NOTE  Subjective:  Andrea Logan is a 35 y.o. [id number] at [redacted]w[redacted]d being seen today for ongoing prenatal care.  She is currently monitored for the following issues for this high-risk pregnancy and has Depression; Supervision of high risk pregnancy, antepartum; Alpha thalassemia silent carrier; Dichorionic diamniotic twin pregnancy, antepartum; Short cervix affecting pregnancy; AMA (advanced maternal age) multigravida 35+; and [redacted] weeks gestation of pregnancy on their problem list.  Patient doing well with no acute concerns today. She reports  feeling lightheaded to the point she feels like she is going to pass out .  Contractions: Irritability. Vag. Bleeding: None.  Movement: Present. Denies leaking of fluid.   The following portions of the patient's history were reviewed and updated as appropriate: allergies, current medications, past family history, past medical history, past social history, past surgical history and problem list. Problem list updated.  Objective:   Vitals:   02/10/21 1000  BP: 108/73  Pulse: (!) 139  Weight: 246 lb (111.6 kg)    Fetal Status: Fetal Heart Rate (bpm): A:157 B:160 Fundal Height: 40 cm Movement: Present     General:  Alert, oriented and cooperative. Patient is in no acute distress.  Skin: Skin is warm and dry. No rash noted.   Cardiovascular: Normal heart rate noted  Respiratory: Normal respiratory effort, no problems with respiration noted  Abdomen: Soft, gravid, appropriate for gestational age.  Pain/Pressure: Present     Pelvic: Cervical exam deferred        Extremities: Normal range of motion.  Edema: Trace  Mental Status:  Normal mood and affect. Normal behavior. Normal judgment and thought content.   Assessment and Plan:  Pregnancy: K9X8338 at [redacted]w[redacted]d  1. Short cervix affecting pregnancy Pt has cerclage in place, no s/sx of PTL Remove cerclage at 36-37 weeks  2. Multigravida of advanced maternal age in third trimester   3. Alpha  thalassemia silent carrier   4. Dichorionic diamniotic twin pregnancy, antepartum Minimal discordance, pt has follow up scan 03/03/21, currently vtx/vtx  5. Supervision of high risk pregnancy, antepartum Cntinue routine care  6. [redacted] weeks gestation of pregnancy   7. Tachycardia Will refer to cardio ob clinic for further eval, pt advised to increase hydration to potentially offset tachycardia - AMB Referral to Cardio Obstetrics  Preterm labor symptoms and general obstetric precautions including but not limited to vaginal bleeding, contractions, leaking of fluid and fetal movement were reviewed in detail with the patient.  Please refer to After Visit Summary for other counseling recommendations.   Return in about 2 weeks (around 02/24/2021) for [facility], in person.   Mariel Aloe, MD Faculty Attending Center for [facility]

## 2021-02-10 NOTE — Progress Notes (Signed)
Order for Zio monitor placed per Dr. Servando Salina.

## 2021-02-10 NOTE — BH Specialist Note (Signed)
Integrated Behavioral Health Follow Up In-Person Visit  MRN: [id number] Name: [name]  Number of Integrated Behavioral Health Clinician visits: 2/6 Session Start time: 10:00am  Session End time: 10:37am Total time: 37 minutes in person at [facility]  Types of Service: Individual psychotherapy  Interpretor:Yes.   Interpretor Name and Language: none  Subjective: Andrea Logan is a 35 y.o. female accompanied by Partner/Significant Other Andrea Logan  Patient was referred by Jaymes Graff MD  for depression. Patient reports the following symptoms/concerns: depressed mood and overwhelmed Duration of problem: over one year ; Severity of problem: mild  Objective: Mood: Depressed and Affect: Depressed Risk of harm to self or others: No plan to harm self or others  Life Context: Family and Social: Lives with children  School/Work: work at home with [employer] Self-Care: n/a Life Changes: New pregnancy and new boyfriend Andrea Logan   Patient and/or Family's Strengths/Protective Factors: Concrete supports in place (healthy food, safe environments, etc.)  Goals Addressed: Patient will:  Reduce symptoms of: depression   Increase knowledge and/or ability of: coping skills   Demonstrate ability to: Increase healthy adjustment to current life circumstances  Progress towards Goals: Achieved  Interventions: Interventions utilized:  Supportive Counseling Standardized Assessments completed: PHQ 9  Patient and/or Family Response: [name] reports improvement in mood and sleep. [name] will make adjustment in diet to decrease sodium to improvement overall health. [name] is accompanied by a new support person name Andrea Logan.  Assessment: Patient currently experiencing depression affecting depression .   Patient may benefit from integrated behavioral health .  Plan: Follow up with behavioral health clinician on : as needed  Behavioral recommendations: collaborate with medical provider regarding physical and  maternal health, prioritize rest Referral(s): n/a "From scale of 1-10, how likely are you to follow plan?":   Andrea Saxon, LCSW

## 2021-02-11 ENCOUNTER — Encounter (HOSPITAL_COMMUNITY): Payer: Self-pay | Admitting: Obstetrics & Gynecology

## 2021-02-11 ENCOUNTER — Inpatient Hospital Stay (HOSPITAL_COMMUNITY)
Admission: AD | Admit: 2021-02-11 | Discharge: 2021-02-11 | Disposition: A | Discharge status: Home / Self Care | Payer: Medicaid Other | Attending: Obstetrics & Gynecology | Admitting: Obstetrics & Gynecology

## 2021-02-11 DIAGNOSIS — O98313 Other infections with a predominantly sexual mode of transmission complicating pregnancy, third trimester: Secondary | ICD-10-CM | POA: Insufficient documentation

## 2021-02-11 DIAGNOSIS — R109 Unspecified abdominal pain: Secondary | ICD-10-CM | POA: Diagnosis not present

## 2021-02-11 DIAGNOSIS — O4703 False labor before 37 completed weeks of gestation, third trimester: Secondary | ICD-10-CM | POA: Insufficient documentation

## 2021-02-11 DIAGNOSIS — O3433 Maternal care for cervical incompetence, third trimester: Secondary | ICD-10-CM | POA: Diagnosis not present

## 2021-02-11 DIAGNOSIS — Z3A32 32 weeks gestation of pregnancy: Secondary | ICD-10-CM | POA: Diagnosis not present

## 2021-02-11 DIAGNOSIS — O09523 Supervision of elderly multigravida, third trimester: Secondary | ICD-10-CM | POA: Diagnosis not present

## 2021-02-11 DIAGNOSIS — O23593 Infection of other part of genital tract in pregnancy, third trimester: Secondary | ICD-10-CM

## 2021-02-11 DIAGNOSIS — A5901 Trichomonal vulvovaginitis: Secondary | ICD-10-CM | POA: Insufficient documentation

## 2021-02-11 DIAGNOSIS — A599 Trichomoniasis, unspecified: Secondary | ICD-10-CM

## 2021-02-11 DIAGNOSIS — O26893 Other specified pregnancy related conditions, third trimester: Secondary | ICD-10-CM | POA: Insufficient documentation

## 2021-02-11 DIAGNOSIS — Z79899 Other long term (current) drug therapy: Secondary | ICD-10-CM | POA: Diagnosis not present

## 2021-02-11 DIAGNOSIS — O30043 Twin pregnancy, dichorionic/diamniotic, third trimester: Secondary | ICD-10-CM | POA: Insufficient documentation

## 2021-02-11 LAB — WET PREP, GENITAL
Clue Cells Wet Prep HPF POC: NONE SEEN
Sperm: NONE SEEN
Yeast Wet Prep HPF POC: NONE SEEN

## 2021-02-11 MED ORDER — LACTATED RINGERS IV BOLUS
1000.0000 mL | Freq: Once | INTRAVENOUS | Status: AC
  Administered 2021-02-11: 1000 mL via INTRAVENOUS

## 2021-02-11 MED ORDER — NIFEDIPINE 10 MG PO CAPS
10.0000 mg | ORAL_CAPSULE | ORAL | Status: DC | PRN
Start: 2021-02-11 — End: 2021-02-12
  Administered 2021-02-11 (×2): 10 mg via ORAL
  Filled 2021-02-11 (×2): qty 1

## 2021-02-11 MED ORDER — METRONIDAZOLE 500 MG PO TABS
2000.0000 mg | ORAL_TABLET | Freq: Once | ORAL | Status: AC
  Administered 2021-02-11: 2000 mg via ORAL
  Filled 2021-02-11: qty 4

## 2021-02-11 NOTE — MAU Provider Note (Signed)
History     CSN: [id number]  Arrival date and time: 02/11/21 1723   Event Date/Time   First Provider Initiated Contact with Patient 02/11/21 1753      Chief Complaint  Patient presents with   Contractions    HPI Andrea Logan is a 35 y.o. [id number] at [redacted]w[redacted]d who presents with abdominal pain. She reports this has been ongoing but got worse today. She rates the pain a 5/10 and is unsure how often they are coming. She denies any leaking or bleeding. She reports an increase in vaginal discharge. She reports fetal movement from both babies.   OB History     Gravida  5   Para  3   Term  3   Preterm      AB  1   Living  3      SAB  1   IAB      Ectopic      Multiple      Live Births  3           Past Medical History:  Diagnosis Date   Anemia    Migraine    Vaginal Pap smear, abnormal     Past Surgical History:  Procedure Laterality Date   CERVICAL CERCLAGE     CERVICAL CERCLAGE N/A 10/08/2020   Procedure: CERCLAGE CERVICAL;  Surgeon: Adam Phenix, MD;  Location: [facility];  Service: Gynecology;  Laterality: N/A;   CERVICAL CONE BIOPSY      Family History  Problem Relation Age of Onset   Hypertension Mother    Diabetes Mother    Dementia Father     Social History   Tobacco Use   Smoking status: Never   Smokeless tobacco: Never  Vaping Use   Vaping Use: Never used  Substance Use Topics   Alcohol use: No    Alcohol/week: 0.0 standard drinks   Drug use: No    Allergies: No Known Allergies  Medications Prior to Admission  Medication Sig Dispense Refill Last Dose   Prenat-Fe Poly-Methfol-FA-DHA (VITAFOL ULTRA) 29-0.6-0.4-200 MG CAPS Take 1 tablet by mouth daily. 90 capsule 4 02/11/2021 at iron   Blood Pressure Monitoring (BLOOD PRESSURE MONITOR AUTOMAT) DEVI 1 Device by Does not apply route daily. Automatic blood pressure cuff regular size. To monitor blood pressure regularly at home. ICD-10 code: O09.90 1 each 0    docusate sodium (COLACE)  100 MG capsule Take 1 capsule (100 mg total) by mouth 2 (two) times daily as needed. 30 capsule 2    Elastic Bandages & Supports (COMFORT FIT MATERNITY SUPP LG) MISC 1 Units by Does not apply route daily. 1 each 0    ferrous sulfate (FERROUSUL) 325 (65 FE) MG tablet Take 1 tablet (325 mg total) by mouth every other day. (Patient not taking: Reported on 02/03/2021) 30 tablet 3    folic acid (FOLVITE) 1 MG tablet Take 1 tablet (1 mg total) by mouth daily. (Patient not taking: Reported on 01/27/2021) 90 tablet 4    metroNIDAZOLE (FLAGYL) 500 MG tablet Take 1 tablet (500 mg total) by mouth 2 (two) times daily. (Patient not taking: Reported on 01/27/2021) 14 tablet 2    Misc. Devices (GOJJI WEIGHT SCALE) MISC 1 Device by Does not apply route daily as needed. To weight self daily as needed at home. ICD-10 code: O09.90 1 each 0    pantoprazole (PROTONIX) 40 MG tablet Take 1 tablet (40 mg total) by mouth daily. (Patient not taking: Reported on  02/03/2021) 30 tablet 1    Prenatal 27-1 MG TABS Take 1 tablet by mouth daily at 6 (six) AM. (Patient not taking: Reported on 01/27/2021) 30 tablet 12    Prenatal Vit-Fe Phos-FA-Omega (VITAFOL GUMMIES) 3.33-0.333-34.8 MG CHEW Chew 1 tablet by mouth daily. (Patient not taking: No sig reported) 90 tablet 5     Review of Systems  Constitutional: Negative.  Negative for fatigue and fever.  HENT: Negative.    Respiratory: Negative.  Negative for shortness of breath.   Cardiovascular: Negative.  Negative for chest pain.  Gastrointestinal:  Positive for abdominal pain. Negative for constipation, diarrhea, nausea and vomiting.  Genitourinary:  Positive for vaginal discharge. Negative for dysuria and vaginal bleeding.  Neurological: Negative.  Negative for dizziness and headaches.  Physical Exam   Blood pressure 118/72, pulse (!) 101, temperature 98.3 F (36.8 C), resp. rate 16, height 5\' 9"  (1.753 m), weight 112 kg, last menstrual period 06/28/2020.  Physical Exam Vitals  and nursing note reviewed.  Constitutional:      General: She is not in acute distress.    Appearance: She is well-developed.  HENT:     Head: Normocephalic.  Eyes:     Pupils: Pupils are equal, round, and reactive to light.  Cardiovascular:     Rate and Rhythm: Normal rate and regular rhythm.     Heart sounds: Normal heart sounds.  Pulmonary:     Effort: Pulmonary effort is normal. No respiratory distress.     Breath sounds: Normal breath sounds.  Abdominal:     General: Bowel sounds are normal. There is no distension.     Palpations: Abdomen is soft.     Tenderness: There is no abdominal tenderness.  Genitourinary:    Comments: SSE: cervix visually closed, cerclage in place without tension. Copious frothy yellow discharge in vault Skin:    General: Skin is warm and dry.  Neurological:     Mental Status: She is alert and oriented to person, place, and time.  Psychiatric:        Mood and Affect: Mood normal.        Behavior: Behavior normal.        Thought Content: Thought content normal.        Judgment: Judgment normal.   Fetal Tracing:  [name]  Baseline: 150 Variability: moderate Accels: 15x15 Decels: none  Baby B  Baseline: 160 Variability: moderate Accels: 15x15 Decels: none  Toco: 2-10  MAU Course  Procedures Results for orders placed or performed during the hospital encounter of 02/11/21 (from the past 24 hour(s))  Wet prep, genital     Status: Abnormal   Collection Time: 02/11/21  6:17 PM  Result Value Ref Range   Yeast Wet Prep HPF POC NONE SEEN NONE SEEN   Trich, Wet Prep PRESENT (A) NONE SEEN   Clue Cells Wet Prep HPF POC NONE SEEN NONE SEEN   WBC, Wet Prep HPF POC MANY (A) NONE SEEN   Sperm NONE SEEN     MDM Patient unable to leave urine sample- discussed hydration needs at length Wet prep and gc/chlamydia Metronidazole PO  IV fluid Bolus Procardia x2 doses- Patient reports resolution of contractions  Assessment and Plan   1.  Threatened premature labor in third trimester   2. Dichorionic diamniotic twin pregnancy in third trimester   3. [redacted] weeks gestation of pregnancy   4. Cervical cerclage suture present in third trimester   5. Trichomonal vaginitis during pregnancy in third trimester    -  Discharge home in stable condition -Reviewed trich precautions at length -Preterm labor precautions discussed -Patient advised to follow-up with OB as scheduled for prenatal care -Patient may return to MAU as needed or if her condition were to change or worsen   Rolm Bookbinder CNM 02/11/2021, 5:53 PM

## 2021-02-11 NOTE — Discharge Instructions (Signed)

## 2021-02-11 NOTE — Progress Notes (Signed)
Patient arrived to MAU complaining of contractions that is every 5-7 mins, lower abdominal and back pressure. Pain 9/10. Patient reports increase of vaginal discharge. She stated " I dont think it is amniotic fluid at all". No vaginal bleeding noted.+ fetal movement x2. Efm commenced.

## 2021-02-12 LAB — GC/CHLAMYDIA PROBE AMP (~~LOC~~) NOT AT ARMC
Chlamydia: NEGATIVE
Comment: NEGATIVE
Comment: NORMAL
Neisseria Gonorrhea: NEGATIVE

## 2021-02-16 ENCOUNTER — Encounter: Payer: Medicaid Other | Admitting: Licensed Clinical Social Worker

## 2021-02-17 ENCOUNTER — Telehealth: Payer: Self-pay

## 2021-02-17 NOTE — Telephone Encounter (Signed)
Pt called asking if she could have a letter for her employer stating her EDD and time she will need to be off with reason until she can get her FMLA papers turned into the office. Advised will get letter written and available in MyChart to print. Pt agreed and verbalized understanding.

## 2021-02-24 ENCOUNTER — Ambulatory Visit (INDEPENDENT_AMBULATORY_CARE_PROVIDER_SITE_OTHER): Payer: Medicaid Other | Admitting: Obstetrics & Gynecology

## 2021-02-24 ENCOUNTER — Other Ambulatory Visit: Payer: Self-pay

## 2021-02-24 ENCOUNTER — Encounter (HOSPITAL_COMMUNITY): Payer: Self-pay | Admitting: Obstetrics and Gynecology

## 2021-02-24 ENCOUNTER — Inpatient Hospital Stay (HOSPITAL_COMMUNITY)
Admission: AD | Admit: 2021-02-24 | Discharge: 2021-02-24 | Disposition: A | Discharge status: Home / Self Care | Payer: Medicaid Other | Attending: Obstetrics and Gynecology | Admitting: Obstetrics and Gynecology

## 2021-02-24 VITALS — BP 102/69 | HR 144 | Wt 247.5 lb

## 2021-02-24 DIAGNOSIS — O30043 Twin pregnancy, dichorionic/diamniotic, third trimester: Secondary | ICD-10-CM | POA: Diagnosis not present

## 2021-02-24 DIAGNOSIS — R000 Tachycardia, unspecified: Secondary | ICD-10-CM | POA: Diagnosis not present

## 2021-02-24 DIAGNOSIS — O99891 Other specified diseases and conditions complicating pregnancy: Secondary | ICD-10-CM | POA: Insufficient documentation

## 2021-02-24 DIAGNOSIS — O0990 Supervision of high risk pregnancy, unspecified, unspecified trimester: Secondary | ICD-10-CM

## 2021-02-24 DIAGNOSIS — O26873 Cervical shortening, third trimester: Secondary | ICD-10-CM

## 2021-02-24 DIAGNOSIS — E860 Dehydration: Secondary | ICD-10-CM

## 2021-02-24 DIAGNOSIS — O30049 Twin pregnancy, dichorionic/diamniotic, unspecified trimester: Secondary | ICD-10-CM

## 2021-02-24 DIAGNOSIS — O09523 Supervision of elderly multigravida, third trimester: Secondary | ICD-10-CM | POA: Insufficient documentation

## 2021-02-24 DIAGNOSIS — O26879 Cervical shortening, unspecified trimester: Secondary | ICD-10-CM

## 2021-02-24 DIAGNOSIS — O99283 Endocrine, nutritional and metabolic diseases complicating pregnancy, third trimester: Secondary | ICD-10-CM

## 2021-02-24 DIAGNOSIS — Z3A34 34 weeks gestation of pregnancy: Secondary | ICD-10-CM

## 2021-02-24 DIAGNOSIS — O99413 Diseases of the circulatory system complicating pregnancy, third trimester: Secondary | ICD-10-CM | POA: Diagnosis present

## 2021-02-24 DIAGNOSIS — O3433 Maternal care for cervical incompetence, third trimester: Secondary | ICD-10-CM

## 2021-02-24 LAB — URINALYSIS, ROUTINE W REFLEX MICROSCOPIC
Glucose, UA: 50 mg/dL — AB
Hgb urine dipstick: NEGATIVE
Ketones, ur: 5 mg/dL — AB
Leukocytes,Ua: NEGATIVE
Nitrite: NEGATIVE
Protein, ur: 100 mg/dL — AB
Specific Gravity, Urine: 1.029 (ref 1.005–1.030)
pH: 6 (ref 5.0–8.0)

## 2021-02-24 LAB — COMPREHENSIVE METABOLIC PANEL
ALT: 11 U/L (ref 0–44)
AST: 18 U/L (ref 15–41)
Albumin: 2.7 g/dL — ABNORMAL LOW (ref 3.5–5.0)
Alkaline Phosphatase: 176 U/L — ABNORMAL HIGH (ref 38–126)
Anion gap: 8 (ref 5–15)
BUN: <5 mg/dL — ABNORMAL LOW (ref 6–20)
CO2: 19 mmol/L — ABNORMAL LOW (ref 22–32)
Calcium: 9.1 mg/dL (ref 8.9–10.3)
Chloride: 108 mmol/L (ref 98–111)
Creatinine, Ser: 0.75 mg/dL (ref 0.44–1.00)
GFR, Estimated: >60 mL/min (ref >60)
Glucose, Bld: 108 mg/dL — ABNORMAL HIGH (ref 70–99)
Potassium: 3.6 mmol/L (ref 3.5–5.1)
Sodium: 135 mmol/L (ref 135–145)
Total Bilirubin: 0.7 mg/dL (ref 0.3–1.2)
Total Protein: 6.6 g/dL (ref 6.5–8.1)

## 2021-02-24 LAB — CBC
HCT: 36.9 % (ref 36.0–46.0)
Hemoglobin: 11.7 g/dL — ABNORMAL LOW (ref 12.0–15.0)
MCH: 27.7 pg (ref 26.0–34.0)
MCHC: 31.7 g/dL (ref 30.0–36.0)
MCV: 87.2 fL (ref 80.0–100.0)
Platelets: 198 10*3/uL (ref 150–400)
RBC: 4.23 MIL/uL (ref 3.87–5.11)
RDW: 15.7 % — ABNORMAL HIGH (ref 11.5–15.5)
WBC: 7.9 10*3/uL (ref 4.0–10.5)
nRBC: 0 % (ref 0.0–0.2)

## 2021-02-24 MED ORDER — PROPRANOLOL HCL 10 MG PO TABS: 10.0000 mg | ORAL_TABLET | Freq: Two times a day (BID) | ORAL | 0 refills | Status: DC | PRN

## 2021-02-24 NOTE — Progress Notes (Signed)
   PRENATAL VISIT NOTE  Subjective:  Andrea Logan is a 35 y.o. [id number] at [redacted]w[redacted]d being seen today for ongoing prenatal care.  She is currently monitored for the following issues for this high-risk pregnancy and has Depression; Supervision of high risk pregnancy, antepartum; Alpha thalassemia silent carrier; Dichorionic diamniotic twin pregnancy, antepartum; Short cervix affecting pregnancy; AMA (advanced maternal age) multigravida 35+; [redacted] weeks gestation of pregnancy; Cervical cerclage suture present in third trimester; and Trichomoniasis on their problem list.  Patient reports fatigue.  Contractions: Irritability. Vag. Bleeding: None.  Movement: Present. Denies leaking of fluid.   The following portions of the patient's history were reviewed and updated as appropriate: allergies, current medications, past family history, past medical history, past social history, past surgical history and problem list.   Objective:   Vitals:   02/24/21 1047 02/24/21 1122  BP: (!) 89/65 102/69  Pulse: (!) 139 (!) 144  Weight: 247 lb 8 oz (112.3 kg)     Fetal Status: Fetal Heart Rate (bpm): A:158/ B:163   Movement: Present     General:  Alert, oriented and cooperative. Patient is in no acute distress.  Skin: Skin is warm and dry. No rash noted.   Cardiovascular: Normal heart rate noted  Respiratory: Normal respiratory effort, no problems with respiration noted  Abdomen: Soft, gravid, appropriate for gestational age.  Pain/Pressure: Present     Pelvic: Cervical exam deferred        Extremities: Normal range of motion.  Edema: Trace  Mental Status: Normal mood and affect. Normal behavior. Normal judgment and thought content.   Assessment and Plan:  Pregnancy: [id number] at [redacted]w[redacted]d 1. Supervision of high risk pregnancy, antepartum Dizzy, increased  HR, needs urgent evaluation in MAU today  2. Dichorionic diamniotic twin pregnancy, antepartum Concordant growth  3. Cervical cerclage suture present in third  trimester In situ  Preterm labor symptoms and general obstetric precautions including but not limited to vaginal bleeding, contractions, leaking of fluid and fetal movement were reviewed in detail with the patient. Please refer to After Visit Summary for other counseling recommendations.   Return in about 1 week (around 03/03/2021).  Future Appointments  Date Time Provider Department Center  03/03/2021  9:45 AM [facility] NURSE [facility]  03/03/2021 10:00 AM [facility] US1 [facility]  03/09/2021 11:40 AM Thomasene Ripple, DO [facility]  03/10/2021 10:45 AM [facility] NURSE [facility]  03/10/2021 11:00 AM [facility] US1 [facility]    Scheryl Darter, MD

## 2021-02-24 NOTE — Progress Notes (Signed)
11:15 Patient reports dizziness. Assisted to bed, HOB down, placed pt on left side.

## 2021-02-24 NOTE — Progress Notes (Signed)
Patient reports BP has been low and HR has been high. Has referral to cardiology.

## 2021-02-24 NOTE — Progress Notes (Signed)
Pt reports fetal movement with some pressure and irritability. 

## 2021-02-24 NOTE — MAU Note (Signed)
Andrea Logan is a 35 y.o. at [redacted]w[redacted]d here in MAU reporting: was seen in the office today for regular ob appt. States she had low BP and elevated HR. Feels lightheaded. Denies pain, vb, or LOF. Endorses good fetal movement for A&B.   Pain score: 0 Vitals:   02/24/21 1316  BP: 109/71  Pulse: (!) 128  Resp: 16  Temp: 97.9 F (36.6 C)     FHT: A- 147     B- 151 Lab orders placed from triage:  UA

## 2021-02-24 NOTE — MAU Provider Note (Signed)
History     CSN: [id number]  Arrival date and time: 02/24/21 1257   Chief Complaint  Patient presents with   Hypotension   Tachycardia   35 y.o. [id number] @34 .3 wks with didi twins sent from office for tachycardia and hypotension. HR and BP were 139 and 89/65 at the office. Reports weeks of dizzy episodes when she stand for periods of time. No syncope. Denies CP. Reports intermittent SOB, mostly with the dizzy episodes. Denies tobacco, ETOH, or drug use. Her pregnancy is complicated by twins, AMA, short cervix with cerclage, trichomonas, alpha thal carrier, and depression. Of note, she was tachycardic at her 32 wk visit and cardiology referral was placed but her appt isn't for 2 weeks.    OB History     Gravida  5   Para  3   Term  3   Preterm      AB  1   Living  3      SAB  1   IAB      Ectopic      Multiple      Live Births  3           Past Medical History:  Diagnosis Date   Anemia    Migraine    Vaginal Pap smear, abnormal     Past Surgical History:  Procedure Laterality Date   CERVICAL CERCLAGE     CERVICAL CERCLAGE N/A 10/08/2020   Procedure: CERCLAGE CERVICAL;  Surgeon: 10/10/2020, MD;  Location: [facility] LD [facility]S;  Service: Gynecology;  Laterality: N/A;   CERVICAL CONE BIOPSY      Family History  Problem Relation Age of Onset   Hypertension Mother    Diabetes Mother    Dementia Father     Social History   Tobacco Use   Smoking status: Never   Smokeless tobacco: Never  Vaping Use   Vaping Use: Never used  Substance Use Topics   Alcohol use: No    Alcohol/week: 0.0 standard drinks   Drug use: No    Allergies: No Known Allergies  Medications Prior to Admission  Medication Sig Dispense Refill Last Dose   docusate sodium (COLACE) 100 MG capsule Take 1 capsule (100 mg total) by mouth 2 (two) times daily as needed. 30 capsule 2 02/24/2021   Prenat-Fe Poly-Methfol-FA-DHA (VITAFOL ULTRA) 29-0.6-0.4-200 MG CAPS Take 1 tablet by mouth  daily. 90 capsule 4 02/24/2021   Blood Pressure Monitoring (BLOOD PRESSURE MONITOR AUTOMAT) DEVI 1 Device by Does not apply route daily. Automatic blood pressure cuff regular size. To monitor blood pressure regularly at home. ICD-10 code: O09.90 1 each 0    Elastic Bandages & Supports (COMFORT FIT MATERNITY SUPP LG) MISC 1 Units by Does not apply route daily. 1 each 0    ferrous sulfate (FERROUSUL) 325 (65 FE) MG tablet Take 1 tablet (325 mg total) by mouth every other day. (Patient not taking: No sig reported) 30 tablet 3    folic acid (FOLVITE) 1 MG tablet Take 1 tablet (1 mg total) by mouth daily. (Patient not taking: No sig reported) 90 tablet 4    Misc. Devices (GOJJI WEIGHT SCALE) MISC 1 Device by Does not apply route daily as needed. To weight self daily as needed at home. ICD-10 code: O09.90 1 each 0    pantoprazole (PROTONIX) 40 MG tablet Take 1 tablet (40 mg total) by mouth daily. (Patient not taking: No sig reported) 30 tablet 1     Review of  Systems  Respiratory:  Positive for shortness of breath.   Cardiovascular:  Negative for chest pain.  Neurological:  Positive for dizziness. Negative for syncope.  Physical Exam   Blood pressure 98/67, pulse (!) 106, temperature 97.9 F (36.6 C), temperature source Oral, resp. rate 15, last menstrual period 06/28/2020, SpO2 100 %. Orthostatic VS for the past 24 hrs:  BP- Lying Pulse- Lying BP- Sitting Pulse- Sitting BP- Standing at 0 minutes  02/24/21 1440 -- -- -- -- 110/69  02/24/21 1438 -- -- 113/63 147 --  02/24/21 1437 100/64 122 -- -- --   Physical Exam Constitutional:      General: She is not in acute distress.    Appearance: Normal appearance.  HENT:     Head: Normocephalic and atraumatic.  Cardiovascular:     Rate and Rhythm: Regular rhythm. Tachycardia present.     Heart sounds: Normal heart sounds.  Pulmonary:     Effort: Pulmonary effort is normal. No respiratory distress.     Breath sounds: Normal breath sounds. No  stridor. No wheezing, rhonchi or rales.  Musculoskeletal:        General: Normal range of motion.     Cervical back: Normal range of motion.  Skin:    General: Skin is warm and dry.  Neurological:     General: No focal deficit present.     Mental Status: She is alert and oriented to person, place, and time.  Psychiatric:        Mood and Affect: Mood normal.        Behavior: Behavior normal.  EFM-A: 140 bpm, mod variability, + accels, no decels EFM-B: 145 bpm, mod variability, + accels, no decels Toco: rare  Results for orders placed or performed during the hospital encounter of 02/24/21 (from the past 24 hour(s))  Urinalysis, Routine w reflex microscopic Urine, Clean Catch     Status: Abnormal   Collection Time: 02/24/21  2:02 PM  Result Value Ref Range   Color, Urine AMBER (A) YELLOW   APPearance HAZY (A) CLEAR   Specific Gravity, Urine 1.029 1.005 - 1.030   pH 6.0 5.0 - 8.0   Glucose, UA 50 (A) NEGATIVE mg/dL   Hgb urine dipstick NEGATIVE NEGATIVE   Bilirubin Urine Kathman (A) NEGATIVE   Ketones, ur 5 (A) NEGATIVE mg/dL   Protein, ur 989 (A) NEGATIVE mg/dL   Nitrite NEGATIVE NEGATIVE   Leukocytes,Ua NEGATIVE NEGATIVE   WBC, UA 0-5 0 - 5 WBC/hpf   Bacteria, UA RARE (A) NONE SEEN   Squamous Epithelial / LPF 6-10 0 - 5   Mucus PRESENT   CBC     Status: Abnormal   Collection Time: 02/24/21  2:29 PM  Result Value Ref Range   WBC 7.9 4.0 - 10.5 K/uL   RBC 4.23 3.87 - 5.11 MIL/uL   Hemoglobin 11.7 (L) 12.0 - 15.0 g/dL   HCT 21.1 94.1 - 74.0 %   MCV 87.2 80.0 - 100.0 fL   MCH 27.7 26.0 - 34.0 pg   MCHC 31.7 30.0 - 36.0 g/dL   RDW 81.4 (H) 48.1 - 85.6 %   Platelets 198 150 - 400 K/uL   nRBC 0.0 0.0 - 0.2 %  Comprehensive metabolic panel     Status: Abnormal   Collection Time: 02/24/21  2:29 PM  Result Value Ref Range   Sodium 135 135 - 145 mmol/L   Potassium 3.6 3.5 - 5.1 mmol/L   Chloride 108 98 - 111 mmol/L  CO2 19 (L) 22 - 32 mmol/L   Glucose, Bld 108 (H) 70 - 99  mg/dL   BUN <5 (L) 6 - 20 mg/dL   Creatinine, Ser 6.38 0.44 - 1.00 mg/dL   Calcium 9.1 8.9 - 75.6 mg/dL   Total Protein 6.6 6.5 - 8.1 g/dL   Albumin 2.7 (L) 3.5 - 5.0 g/dL   AST 18 15 - 41 U/L   ALT 11 0 - 44 U/L   Alkaline Phosphatase 176 (H) 38 - 126 U/L   Total Bilirubin 0.7 0.3 - 1.2 mg/dL   GFR, Estimated >43 >32 mL/min   Anion gap 8 5 - 15   MAU Course  Procedures  MDM Labs and EKG ordered. Mild tachycardia noted with brief episode of HR up to 150 while standing. Discussed presentation and clinical findings with Dr. Servando Salina who recommends increased hydration and Propranolol 10 mg at bedtime and bid prn as long as SBP >110. She will also be contacted by the Cardiology office to set up a cardiac monitor prior to her appt. in 2 weeks. Discussed recommendations and plan with pt. Stable for discharge home.    Assessment and Plan   1. Dichorionic diamniotic twin pregnancy, antepartum   2. Short cervix affecting pregnancy   3. Supervision of high risk pregnancy, antepartum   4. Multigravida of advanced maternal age in third trimester   5. [redacted] weeks gestation of pregnancy   6. Dichorionic diamniotic twin pregnancy in third trimester   7. Tachycardia   8. Dehydration    Discharge home Follow up at [facility] as scheduled Follow up with Dr. Servando Salina in 2 weeks Return precautions Rx Inderal  Allergies as of 02/24/2021   No Known Allergies      Medication List     TAKE these medications    Blood Pressure Monitor Automat Devi 1 Device by Does not apply route daily. Automatic blood pressure cuff regular size. To monitor blood pressure regularly at home. ICD-10 code: O09.90   Comfort Fit Maternity Supp Lg Misc 1 Units by Does not apply route daily.   docusate sodium 100 MG capsule Commonly known as: COLACE Take 1 capsule (100 mg total) by mouth 2 (two) times daily as needed.   ferrous sulfate 325 (65 FE) MG tablet Commonly known as: FerrouSul Take 1 tablet (325 mg total) by mouth  every other day.   folic acid 1 MG tablet Commonly known as: FOLVITE Take 1 tablet (1 mg total) by mouth daily.   Gojji Weight Scale Misc 1 Device by Does not apply route daily as needed. To weight self daily as needed at home. ICD-10 code: O09.90   pantoprazole 40 MG tablet Commonly known as: Protonix Take 1 tablet (40 mg total) by mouth daily.   propranolol 10 MG tablet Commonly known as: INDERAL Take 1 tablet (10 mg total) by mouth 2 (two) times daily as needed. Take and bedtime and as needed   Vitafol Ultra 29-0.6-0.4-200 MG Caps Take 1 tablet by mouth daily.       Donette Larry, CNM 02/24/2021, 4:17 PM

## 2021-02-25 ENCOUNTER — Ambulatory Visit: Payer: Medicaid Other

## 2021-02-25 ENCOUNTER — Other Ambulatory Visit: Payer: Self-pay

## 2021-02-25 ENCOUNTER — Telehealth: Payer: Self-pay

## 2021-02-25 DIAGNOSIS — R000 Tachycardia, unspecified: Secondary | ICD-10-CM

## 2021-02-25 NOTE — Progress Notes (Unsigned)
Patient enrolled for 14 day ZIO XT monitor to be mailed 3 day [employer]PS to her address on file.

## 2021-02-25 NOTE — Progress Notes (Signed)
Order placed

## 2021-02-25 NOTE — Telephone Encounter (Signed)
Called pt to let her know Dr. Servando Salina wants her to wear a Zio patch. She was agreeable to the plan.

## 2021-03-03 ENCOUNTER — Ambulatory Visit: Payer: Medicaid Other | Attending: Obstetrics

## 2021-03-03 ENCOUNTER — Other Ambulatory Visit: Payer: Self-pay

## 2021-03-03 ENCOUNTER — Encounter: Payer: Self-pay | Admitting: *Deleted

## 2021-03-03 ENCOUNTER — Ambulatory Visit: Payer: Medicaid Other | Admitting: *Deleted

## 2021-03-03 VITALS — BP 110/68 | HR 88

## 2021-03-03 DIAGNOSIS — O0990 Supervision of high risk pregnancy, unspecified, unspecified trimester: Secondary | ICD-10-CM | POA: Insufficient documentation

## 2021-03-03 DIAGNOSIS — O30043 Twin pregnancy, dichorionic/diamniotic, third trimester: Secondary | ICD-10-CM | POA: Insufficient documentation

## 2021-03-03 DIAGNOSIS — O30049 Twin pregnancy, dichorionic/diamniotic, unspecified trimester: Secondary | ICD-10-CM | POA: Insufficient documentation

## 2021-03-03 DIAGNOSIS — O09523 Supervision of elderly multigravida, third trimester: Secondary | ICD-10-CM

## 2021-03-03 DIAGNOSIS — O99213 Obesity complicating pregnancy, third trimester: Secondary | ICD-10-CM

## 2021-03-03 DIAGNOSIS — O09293 Supervision of pregnancy with other poor reproductive or obstetric history, third trimester: Secondary | ICD-10-CM | POA: Diagnosis not present

## 2021-03-03 DIAGNOSIS — E669 Obesity, unspecified: Secondary | ICD-10-CM

## 2021-03-03 DIAGNOSIS — Z3A31 31 weeks gestation of pregnancy: Secondary | ICD-10-CM | POA: Diagnosis not present

## 2021-03-03 DIAGNOSIS — O3433 Maternal care for cervical incompetence, third trimester: Secondary | ICD-10-CM | POA: Diagnosis not present

## 2021-03-03 DIAGNOSIS — O26879 Cervical shortening, unspecified trimester: Secondary | ICD-10-CM | POA: Insufficient documentation

## 2021-03-04 ENCOUNTER — Other Ambulatory Visit: Payer: Self-pay | Admitting: *Deleted

## 2021-03-04 DIAGNOSIS — O30009 Twin pregnancy, unspecified number of placenta and unspecified number of amniotic sacs, unspecified trimester: Secondary | ICD-10-CM

## 2021-03-05 ENCOUNTER — Encounter: Payer: Self-pay | Admitting: *Deleted

## 2021-03-06 ENCOUNTER — Encounter: Payer: Medicaid Other | Admitting: Obstetrics & Gynecology

## 2021-03-09 ENCOUNTER — Other Ambulatory Visit: Payer: Self-pay

## 2021-03-09 ENCOUNTER — Other Ambulatory Visit (HOSPITAL_COMMUNITY)
Admission: RE | Admit: 2021-03-09 | Discharge: 2021-03-09 | Disposition: A | Discharge status: Home / Self Care | Payer: Medicaid Other | Source: Ambulatory Visit | Attending: Obstetrics & Gynecology | Admitting: Obstetrics & Gynecology

## 2021-03-09 ENCOUNTER — Ambulatory Visit (INDEPENDENT_AMBULATORY_CARE_PROVIDER_SITE_OTHER): Payer: Medicaid Other | Admitting: Obstetrics and Gynecology

## 2021-03-09 ENCOUNTER — Ambulatory Visit (INDEPENDENT_AMBULATORY_CARE_PROVIDER_SITE_OTHER): Payer: Medicaid Other | Admitting: Cardiology

## 2021-03-09 VITALS — BP 97/66 | HR 99 | Wt 256.0 lb

## 2021-03-09 VITALS — BP 118/72 | HR 117 | Ht 69.0 in | Wt 253.4 lb

## 2021-03-09 DIAGNOSIS — O0990 Supervision of high risk pregnancy, unspecified, unspecified trimester: Secondary | ICD-10-CM | POA: Diagnosis not present

## 2021-03-09 DIAGNOSIS — O30049 Twin pregnancy, dichorionic/diamniotic, unspecified trimester: Secondary | ICD-10-CM

## 2021-03-09 DIAGNOSIS — R002 Palpitations: Secondary | ICD-10-CM

## 2021-03-09 DIAGNOSIS — O09523 Supervision of elderly multigravida, third trimester: Secondary | ICD-10-CM

## 2021-03-09 DIAGNOSIS — O3433 Maternal care for cervical incompetence, third trimester: Secondary | ICD-10-CM

## 2021-03-09 DIAGNOSIS — D563 Thalassemia minor: Secondary | ICD-10-CM

## 2021-03-09 DIAGNOSIS — O26879 Cervical shortening, unspecified trimester: Secondary | ICD-10-CM

## 2021-03-09 NOTE — Progress Notes (Signed)
Cardio-Obstetrics Clinic  New Evaluation  Date:  03/09/2021   ID:  Andrea Logan, DOB [birth year], MRN [id number]  PCP:  Grayce Sessions, NP   [facility] Providers Cardiologist:  Thomasene Ripple, DO  Electrophysiologist:  None       Referring MD: Warden Fillers, MD   Chief Complaint: " I have been having palpitations"  History of Present Illness:    Andrea Logan is a 35 y.o. female [G5P3013] who is being seen today for the evaluation of palpiations at the request of Warden Fillers, MD.   Her medical history includes migraine headaches, she is currently 36 weeks and 2 days pregnant with twin.  Patient tells me that she has been experiencing significant palpitations.  She notes that her heart rate is going up into the 160s.  Recently she was started on propanolol 10 mg and she takes it once a day.  Has helped tremendously.  Shortness of breath is also an issue but is not worsening.  She denies any chest pain.  She has family history of cardiomyopathy in her mother as well as sudden cardiac death her brother.   Prior CV Studies Reviewed: The following studies were reviewed today:   Past Medical History:  Diagnosis Date   Anemia    Migraine    Vaginal Pap smear, abnormal     Past Surgical History:  Procedure Laterality Date   CERVICAL CERCLAGE     CERVICAL CERCLAGE N/A 10/08/2020   Procedure: CERCLAGE CERVICAL;  Surgeon: Adam Phenix, MD;  Location: [facility] LD ORS;  Service: Gynecology;  Laterality: N/A;   CERVICAL CONE BIOPSY        OB History     Gravida  5   Para  3   Term  3   Preterm      AB  1   Living  3      SAB  1   IAB      Ectopic      Multiple      Live Births  3               Current Medications: No outpatient medications have been marked as taking for the 03/09/21 encounter (Office Visit) with Thomasene Ripple, DO.     Allergies:   Patient has no known allergies.   Social History   Socioeconomic History   Marital  status: Single    Spouse name: Not on file   Number of children: 3   Years of education: Not on file   Highest education level: Associate degree: occupational, Scientist, product/process development, or vocational program  Occupational History   Not on file  Tobacco Use   Smoking status: Never   Smokeless tobacco: Never  Vaping Use   Vaping Use: Never used  Substance and Sexual Activity   Alcohol use: No    Alcohol/week: 0.0 standard drinks   Drug use: No   Sexual activity: Yes    Birth control/protection: None    Comment: IUD removed Sept 2021  Other Topics Concern   Not on file  Social History Narrative   Not on file   Social Determinants of Health   Financial Resource Strain: Not on file  Food Insecurity: Not on file  Transportation Needs: Not on file  Physical Activity: Not on file  Stress: Not on file  Social Connections: Not on file      Family History  Problem Relation Age of Onset   Hypertension Mother    Diabetes  Mother    Dementia Father       ROS:   Please see the history of present illness.     Review of Systems  Constitution: Negative for decreased appetite, fever and weight gain.  HENT: Negative for congestion, ear discharge, hoarse voice and sore throat.   Eyes: Negative for discharge, redness, vision loss in right eye and visual halos.  Cardiovascular: Reports Palpiations.  Negative for chest pain, dyspnea on exertion, leg swelling, orthopnea and palpitations.  Respiratory: Negative for cough, hemoptysis, shortness of breath and snoring.   Endocrine: Negative for heat intolerance and polyphagia.  Hematologic/Lymphatic: Negative for bleeding problem. Does not bruise/bleed easily.  Skin: Negative for flushing, nail changes, rash and suspicious lesions.  Musculoskeletal: Negative for arthritis, joint pain, muscle cramps, myalgias, neck pain and stiffness.  Gastrointestinal: Negative for abdominal pain, bowel incontinence, diarrhea and excessive appetite.  Genitourinary:  Negative for decreased libido, genital sores and incomplete emptying.  Neurological: Negative for brief paralysis, focal weakness, headaches and loss of balance.  Psychiatric/Behavioral: Negative for altered mental status, depression and suicidal ideas.  Allergic/Immunologic: Negative for HIV exposure and persistent infections.     Labs/EKG Reviewed:    EKG:   EKG is was ordered today.  The ekg ordered today demonstrates sinus tachycardia.  Recent Labs: 02/24/2021: ALT 11; BUN <5; Creatinine, Ser 0.75; Hemoglobin 11.7; Platelets 198; Potassium 3.6; Sodium 135   Recent Lipid Panel Lab Results  Component Value Date/Time   CHOL 154 02/06/2015 04:35 PM   TRIG 78 02/06/2015 04:35 PM   HDL 51 02/06/2015 04:35 PM    Physical Exam:    VS:  BP 118/72   Pulse (!) 117   Ht 5\' 9"  (1.753 m)   Wt 253 lb 6.4 oz (114.9 kg)   LMP 06/28/2020 (Exact Date)   SpO2 99%   BMI 37.42 kg/m     Wt Readings from Last 3 Encounters:  03/09/21 253 lb 6.4 oz (114.9 kg)  03/09/21 256 lb (116.1 kg)  02/24/21 247 lb 8 oz (112.3 kg)     GEN:  Well nourished, well developed in no acute distress HEENT: Normal NECK: No JVD; No carotid bruits LYMPHATICS: No lymphadenopathy CARDIAC: RRR, no murmurs, rubs, gallops RESPIRATORY:  Clear to auscultation without rales, wheezing or rhonchi  ABDOMEN: Soft, non-tender, non-distended MUSCULOSKELETAL:  No edema; No deformity  SKIN: Warm and dry NEUROLOGIC:  Alert and oriented x 3 PSYCHIATRIC:  Normal affect    Risk Assessment/Risk Calculators:     CARPREG II Risk Prediction Index Score:  1.  The patient's risk for a primary cardiac event is 5%.            ASSESSMENT & PLAN:    Palpitation   She has had some relief with the propanolol.  Thankfully her heart rate is not going up into the 180s any longer.  We did send out a monitor for the patient to wear prior to this visit to be able to review and see if she is not going out of rhythm but  unfortunately she was unable to wear this monitor. Clinical exam does not show any evidence of fluid overload we will continue to monitor the patient.  The patient is in agreement with the above plan. The patient left the office in stable condition.  The patient will follow up in 4 weeks postpartum.  Patient Instructions  Medication Instructions:  Your physician recommends that you continue on your current medications as directed. Please refer to the Current  Medication list given to you today.  *If you need a refill on your cardiac medications before your next appointment, please call your pharmacy*   Lab Work: None If you have labs (blood work) drawn today and your tests are completely normal, you will receive your results only by: MyChart Message (if you have MyChart) OR A paper copy in the mail If you have any lab test that is abnormal or we need to change your treatment, we will call you to review the results.   Testing/Procedures: None   Follow-Up: At [facility], you and your health needs are our priority.  As part of our continuing mission to provide you with exceptional heart care, we have created designated Provider Care Teams.  These Care Teams include your primary Cardiologist (physician) and Advanced Practice Providers (APPs -  Physician Assistants and Nurse Practitioners) who all work together to provide you with the care you need, when you need it.  We recommend signing up for the patient portal called "MyChart".  Sign up information is provided on this After Visit Summary.  MyChart is used to connect with patients for Virtual Visits (Telemedicine).  Patients are able to view lab/test results, encounter notes, upcoming appointments, etc.  Non-urgent messages can be sent to your provider as well.   To learn more about what you can do with MyChart, go to [email or web address].    Your next appointment:   4 week(s)  The format for your next appointment:   In  Person  Provider:   Thomasene Ripple, DO [address], [place], [place] [address]    Other Instructions     Dispo:  No follow-ups on file.   Medication Adjustments/Labs and Tests Ordered: Current medicines are reviewed at length with the patient today.  Concerns regarding medicines are outlined above.  Tests Ordered: Orders Placed This Encounter  Procedures   EKG 12-Lead    Medication Changes: No orders of the defined types were placed in this encounter.

## 2021-03-09 NOTE — Progress Notes (Signed)
Patient presents for ROB and GBS. Patient has no concerns today. TOC for trich completed today. Cerclage removal

## 2021-03-09 NOTE — Patient Instructions (Signed)
Medication Instructions:  Your physician recommends that you continue on your current medications as directed. Please refer to the Current Medication list given to you today.  *If you need a refill on your cardiac medications before your next appointment, please call your pharmacy*   Lab Work: None If you have labs (blood work) drawn today and your tests are completely normal, you will receive your results only by: MyChart Message (if you have MyChart) OR A paper copy in the mail If you have any lab test that is abnormal or we need to change your treatment, we will call you to review the results.   Testing/Procedures: None   Follow-Up: At [facility], you and your health needs are our priority.  As part of our continuing mission to provide you with exceptional heart care, we have created designated Provider Care Teams.  These Care Teams include your primary Cardiologist (physician) and Advanced Practice Providers (APPs -  Physician Assistants and Nurse Practitioners) who all work together to provide you with the care you need, when you need it.  We recommend signing up for the patient portal called "MyChart".  Sign up information is provided on this After Visit Summary.  MyChart is used to connect with patients for Virtual Visits (Telemedicine).  Patients are able to view lab/test results, encounter notes, upcoming appointments, etc.  Non-urgent messages can be sent to your provider as well.   To learn more about what you can do with MyChart, go to [email or web address].    Your next appointment:   4 week(s)  The format for your next appointment:   In Person  Provider:   Thomasene Ripple, DO [address], [place], [place] [address]    Other Instructions

## 2021-03-09 NOTE — Progress Notes (Signed)
Subjective:  Andrea Logan is a 35 y.o. [id number] at [redacted]w[redacted]d being seen today for ongoing prenatal care.  She is currently monitored for the following issues for this high-risk pregnancy and has Depression; Supervision of high risk pregnancy, antepartum; Alpha thalassemia silent carrier; Dichorionic diamniotic twin pregnancy, antepartum; Short cervix affecting pregnancy; AMA (advanced maternal age) multigravida 35+; Cervical cerclage suture present in third trimester; and Trichomoniasis on their problem list.  Patient reports general discomforts of pregnancy.  Contractions: Irritability. Vag. Bleeding: None.  Movement: Present. Denies leaking of fluid.   The following portions of the patient's history were reviewed and updated as appropriate: allergies, current medications, past family history, past medical history, past social history, past surgical history and problem list. Problem list updated.  Objective:   Vitals:   03/09/21 1005  BP: 97/66  Pulse: 99  Weight: 256 lb (116.1 kg)    Fetal Status: Fetal Heart Rate (bpm): A: 155, B: 145   Movement: Present     General:  Alert, oriented and cooperative. Patient is in no acute distress.  Skin: Skin is warm and dry. No rash noted.   Cardiovascular: Normal heart rate noted  Respiratory: Normal respiratory effort, no problems with respiration noted  Abdomen: Soft, gravid, appropriate for gestational age. Pain/Pressure: Present     Pelvic:  Cercalge removed without problems, pt declined cervical exam afterwards        Extremities: Normal range of motion.  Edema: Trace  Mental Status: Normal mood and affect. Normal behavior. Normal judgment and thought content.   Urinalysis:      Assessment and Plan:  Pregnancy: A5W0981 at [redacted]w[redacted]d  1. Dichorionic diamniotic twin pregnancy, antepartum Stable GBS and vaginal cultures today Continue with serial growth scans and weekly antenatal testing as per MFM IOL scheduled for 38 week  2. Short cervix  affecting pregnancy Cerclage removed today without problems  3. Supervision of high risk pregnancy, antepartum See above  4. Multigravida of advanced maternal age in third trimester Stable  5. Cervical cerclage suture present in third trimester Removed today  6. Alpha thalassemia silent carrier stable  Term labor symptoms and general obstetric precautions including but not limited to vaginal bleeding, contractions, leaking of fluid and fetal movement were reviewed in detail with the patient. Please refer to After Visit Summary for other counseling recommendations.  No follow-ups on file.   Hermina Staggers, MD

## 2021-03-09 NOTE — Patient Instructions (Signed)
Vaginal Delivery ?Vaginal delivery means that you give birth by pushing your baby out of your birth canal (vagina). Your health care team will help you before, during, and after vaginal delivery. ?Birth experiences are unique for every woman and every pregnancy, and birth experiences vary depending on where you choose to give birth. ?What are the risks and benefits? ?Generally, this is safe. However, problems may occur, including: ?Bleeding. ?Infection. ?Damage to other structures such as vaginal tearing. ?Allergic reactions to medicines. ?Despite the risks, benefits of vaginal delivery include less risk of bleeding and infection and a shorter recovery time compared to a Cesarean delivery. Cesarean delivery, or C-section, is the surgical delivery of a baby. ?What happens when I arrive at the birth center or hospital? ?Once you are in labor and have been admitted into the hospital or birth center, your health care team may: ?Review your pregnancy history and any concerns that you have. ?Talk with you about your birth plan and discuss pain control options. ?Check your blood pressure, breathing, and heartbeat. ?Assess your baby's heartbeat. ?Monitor your uterus for contractions. ?Check whether your bag of water (amniotic sac) has broken (ruptured). ?Insert an IV into one of your veins. This may be used to give you fluids and medicines. ?Monitoring ?Your health care team may assess your contractions (uterine monitoring) and your baby's heart rate (fetal monitoring). You may need to be monitored: ?Often, but not continuously (intermittently). ?All the time or for long periods at a time (continuously). Continuous monitoring may be needed if: ?You are taking certain medicines, such as medicine to relieve pain or make your contractions stronger. ?You have pregnancy or labor complications. ?Monitoring may be done by: ?Placing a special stethoscope or a handheld monitoring device on your abdomen to check your baby's heartbeat  and to check for contractions. ?Placing monitors on your abdomen (external monitors) to record your baby's heartbeat and the frequency and length of contractions. ?Placing monitors inside your uterus through your vagina (internal monitors) to record your baby's heartbeat and the frequency, length, and strength of your contractions. Depending on the type of monitor, it may remain in your uterus or on your baby's head until birth. ?Telemetry. This is a type of continuous monitoring that can be done with external or internal monitors. Instead of having to stay in bed, you are able to move around. ?Physical exam ?Your health care team may perform frequent physical exams. This may include: ?Checking how and where your baby is positioned in your uterus. ?Checking your cervix to determine: ?Whether it is thinning out (effacing). ?Whether it is opening up (dilating). ?What happens during labor and delivery? ?Normal labor and delivery is divided into the following three stages: ?Stage 1 ?This is the longest stage of labor. ?Throughout this stage, you will feel contractions. Contractions generally feel mild, infrequent, and irregular at first. They get stronger, more frequent, and more regular as you move through this stage. You may have contractions about every 2-3 minutes. ?This stage ends when your cervix is completely dilated to 4 inches (10 cm) and completely effaced. ?Stage 2 ?This stage starts once your cervix is completely effaced and dilated and lasts until the delivery of your baby. ?This is the stage where you will feel an urge to push your baby out of your vagina. ?You may feel stretching and burning pain, especially when the widest part of your baby's head passes through the vaginal opening (crowning). ?Once your baby is delivered, the umbilical cord will be clamped and   cut. Timing of cutting the cord will depend on your wishes, your baby's health, and your health care provider's practices. ?Your baby will be  placed on your bare chest (skin-to-skin contact) in an upright position and covered with a warm blanket. If you are choosing to breastfeed, watch your baby for feeding cues, like rooting or sucking, and help the baby to your breast for his or her first feeding. ?Stage 3 ?This stage starts immediately after the birth of your baby and ends after you deliver the placenta. ?This stage may take anywhere from 5 to 30 minutes. ?After your baby has been delivered, you will feel contractions as your body expels the placenta. These contractions also help your uterus get smaller and reduce bleeding. ?What can I expect after labor and delivery? ?After labor is over, you and your baby will be assessed closely until you are ready to go home. Your health care team will teach you how to care for yourself and your baby. ?You and your baby may be encouraged to stay in the same room (rooming in) during your hospital stay. This will help promote early bonding and successful breastfeeding. ?Your uterus will be checked and massaged regularly (fundal massage). ?You may continue to receive fluids and medicines through an IV. ?You will have some soreness and pain in your abdomen, vagina, and the area of skin between your vaginal opening and your anus (perineum). ?If an incision was made near your vagina (episiotomy) or if you had some vaginal tearing during delivery, cold compresses may be placed on your episiotomy or your tear. This helps to reduce pain and swelling. ?It is normal to have vaginal bleeding after delivery. Wear a sanitary pad for vaginal bleeding and discharge. ?Summary ?Vaginal delivery means that you will give birth by pushing your baby out of your birth canal (vagina). ?Your health care team will monitor you and your baby throughout the stages of labor. ?After you deliver your baby, your health care team will continue to assess you and your baby to ensure you are both recovering as expected after delivery. ?This  information is not intended to replace advice given to you by your health care provider. Make sure you discuss any questions you have with your health care provider. ?Document Revised: 03/31/2020 Document Reviewed: 03/31/2020 ?Elsevier Patient Education ? 2022 [employer] ? ?

## 2021-03-10 ENCOUNTER — Ambulatory Visit: Payer: Medicaid Other | Admitting: *Deleted

## 2021-03-10 ENCOUNTER — Ambulatory Visit: Payer: Medicaid Other | Attending: Obstetrics

## 2021-03-10 VITALS — BP 101/67 | HR 117

## 2021-03-10 DIAGNOSIS — O3433 Maternal care for cervical incompetence, third trimester: Secondary | ICD-10-CM

## 2021-03-10 DIAGNOSIS — O0990 Supervision of high risk pregnancy, unspecified, unspecified trimester: Secondary | ICD-10-CM | POA: Insufficient documentation

## 2021-03-10 DIAGNOSIS — Z3A36 36 weeks gestation of pregnancy: Secondary | ICD-10-CM

## 2021-03-10 DIAGNOSIS — O30049 Twin pregnancy, dichorionic/diamniotic, unspecified trimester: Secondary | ICD-10-CM

## 2021-03-10 DIAGNOSIS — O09523 Supervision of elderly multigravida, third trimester: Secondary | ICD-10-CM

## 2021-03-10 DIAGNOSIS — O30043 Twin pregnancy, dichorionic/diamniotic, third trimester: Secondary | ICD-10-CM | POA: Diagnosis not present

## 2021-03-10 DIAGNOSIS — O99213 Obesity complicating pregnancy, third trimester: Secondary | ICD-10-CM | POA: Diagnosis not present

## 2021-03-10 DIAGNOSIS — O26879 Cervical shortening, unspecified trimester: Secondary | ICD-10-CM | POA: Insufficient documentation

## 2021-03-10 DIAGNOSIS — O09293 Supervision of pregnancy with other poor reproductive or obstetric history, third trimester: Secondary | ICD-10-CM | POA: Diagnosis not present

## 2021-03-10 DIAGNOSIS — E669 Obesity, unspecified: Secondary | ICD-10-CM | POA: Diagnosis not present

## 2021-03-10 LAB — CERVICOVAGINAL ANCILLARY ONLY
Chlamydia: NEGATIVE
Comment: NEGATIVE
Comment: NEGATIVE
Comment: NORMAL
Neisseria Gonorrhea: NEGATIVE
Trichomonas: POSITIVE — AB

## 2021-03-11 ENCOUNTER — Other Ambulatory Visit: Payer: Self-pay

## 2021-03-11 ENCOUNTER — Encounter: Payer: Self-pay | Admitting: Obstetrics and Gynecology

## 2021-03-11 ENCOUNTER — Other Ambulatory Visit: Payer: Self-pay | Admitting: Advanced Practice Midwife

## 2021-03-11 DIAGNOSIS — A599 Trichomoniasis, unspecified: Secondary | ICD-10-CM

## 2021-03-11 DIAGNOSIS — O99820 Streptococcus B carrier state complicating pregnancy: Secondary | ICD-10-CM | POA: Insufficient documentation

## 2021-03-11 LAB — STREP GP B NAA: Strep Gp B NAA: POSITIVE — AB

## 2021-03-11 MED ORDER — METRONIDAZOLE 500 MG PO TABS
500.0000 mg | ORAL_TABLET | Freq: Two times a day (BID) | ORAL | 0 refills | Status: DC
Start: 2021-03-11 — End: 2021-07-13

## 2021-03-13 ENCOUNTER — Telehealth: Payer: Self-pay | Admitting: *Deleted

## 2021-03-13 NOTE — Telephone Encounter (Signed)
Pt called to office to see if her induction date could be moved up to an earlier date. Pt in currently scheduled on 11/5 (38 weeks) and would like to have moved to 11/1.  Pt made aware message to be sent to provider for recommendation.   Please advise

## 2021-03-15 ENCOUNTER — Inpatient Hospital Stay (HOSPITAL_COMMUNITY): Payer: Medicaid Other | Admitting: Anesthesiology

## 2021-03-15 ENCOUNTER — Encounter (HOSPITAL_COMMUNITY): Payer: Self-pay | Admitting: Obstetrics and Gynecology

## 2021-03-15 ENCOUNTER — Other Ambulatory Visit: Payer: Self-pay

## 2021-03-15 ENCOUNTER — Inpatient Hospital Stay (HOSPITAL_COMMUNITY)
Admission: AD | Admit: 2021-03-15 | Discharge: 2021-03-19 | DRG: 787 | Disposition: A | Discharge status: Home / Self Care | Payer: Medicaid Other | Attending: Family Medicine | Admitting: Family Medicine

## 2021-03-15 DIAGNOSIS — D563 Thalassemia minor: Secondary | ICD-10-CM | POA: Diagnosis not present

## 2021-03-15 DIAGNOSIS — R002 Palpitations: Secondary | ICD-10-CM | POA: Diagnosis not present

## 2021-03-15 DIAGNOSIS — Z3A00 Weeks of gestation of pregnancy not specified: Secondary | ICD-10-CM | POA: Diagnosis not present

## 2021-03-15 DIAGNOSIS — D62 Acute posthemorrhagic anemia: Secondary | ICD-10-CM | POA: Diagnosis not present

## 2021-03-15 DIAGNOSIS — Z20822 Contact with and (suspected) exposure to covid-19: Secondary | ICD-10-CM | POA: Diagnosis not present

## 2021-03-15 DIAGNOSIS — O99820 Streptococcus B carrier state complicating pregnancy: Secondary | ICD-10-CM

## 2021-03-15 DIAGNOSIS — Z09 Encounter for follow-up examination after completed treatment for conditions other than malignant neoplasm: Principal | ICD-10-CM

## 2021-03-15 DIAGNOSIS — O30049 Twin pregnancy, dichorionic/diamniotic, unspecified trimester: Secondary | ICD-10-CM | POA: Diagnosis present

## 2021-03-15 DIAGNOSIS — Z3A37 37 weeks gestation of pregnancy: Secondary | ICD-10-CM | POA: Diagnosis not present

## 2021-03-15 DIAGNOSIS — O4202 Full-term premature rupture of membranes, onset of labor within 24 hours of rupture: Secondary | ICD-10-CM | POA: Diagnosis not present

## 2021-03-15 DIAGNOSIS — O30009 Twin pregnancy, unspecified number of placenta and unspecified number of amniotic sacs, unspecified trimester: Secondary | ICD-10-CM | POA: Diagnosis present

## 2021-03-15 DIAGNOSIS — Z98891 History of uterine scar from previous surgery: Secondary | ICD-10-CM

## 2021-03-15 DIAGNOSIS — O99824 Streptococcus B carrier state complicating childbirth: Secondary | ICD-10-CM | POA: Diagnosis not present

## 2021-03-15 DIAGNOSIS — O30043 Twin pregnancy, dichorionic/diamniotic, third trimester: Secondary | ICD-10-CM | POA: Diagnosis not present

## 2021-03-15 DIAGNOSIS — O09529 Supervision of elderly multigravida, unspecified trimester: Secondary | ICD-10-CM

## 2021-03-15 DIAGNOSIS — O328XX2 Maternal care for other malpresentation of fetus, fetus 2: Principal | ICD-10-CM | POA: Diagnosis present

## 2021-03-15 DIAGNOSIS — O99892 Other specified diseases and conditions complicating childbirth: Secondary | ICD-10-CM | POA: Diagnosis not present

## 2021-03-15 DIAGNOSIS — A599 Trichomoniasis, unspecified: Secondary | ICD-10-CM | POA: Diagnosis present

## 2021-03-15 DIAGNOSIS — O9081 Anemia of the puerperium: Secondary | ICD-10-CM | POA: Diagnosis not present

## 2021-03-15 DIAGNOSIS — O0990 Supervision of high risk pregnancy, unspecified, unspecified trimester: Secondary | ICD-10-CM

## 2021-03-15 LAB — CBC
HCT: 36.7 % (ref 36.0–46.0)
Hemoglobin: 12 g/dL (ref 12.0–15.0)
MCH: 28.2 pg (ref 26.0–34.0)
MCHC: 32.7 g/dL (ref 30.0–36.0)
MCV: 86.2 fL (ref 80.0–100.0)
Platelets: 153 10*3/uL (ref 150–400)
RBC: 4.26 MIL/uL (ref 3.87–5.11)
RDW: 17 % — ABNORMAL HIGH (ref 11.5–15.5)
WBC: 10.8 10*3/uL — ABNORMAL HIGH (ref 4.0–10.5)
nRBC: 0.2 % (ref 0.0–0.2)

## 2021-03-15 LAB — RESP PANEL BY RT-PCR (FLU A&B, COVID) ARPGX2
Influenza A by PCR: NEGATIVE
Influenza B by PCR: NEGATIVE
SARS Coronavirus 2 by RT PCR: NEGATIVE

## 2021-03-15 MED ORDER — ACETAMINOPHEN 325 MG PO TABS: 650.0000 mg | ORAL_TABLET | ORAL | Status: DC | PRN

## 2021-03-15 MED ORDER — PHENYLEPHRINE 40 MCG/ML (10ML) SYRINGE FOR IV PUSH (FOR BLOOD PRESSURE SUPPORT): 80.0000 ug | PREFILLED_SYRINGE | INTRAVENOUS | Status: DC | PRN

## 2021-03-15 MED ORDER — OXYTOCIN BOLUS FROM INFUSION: 333.0000 mL | Freq: Once | INTRAVENOUS | Status: DC

## 2021-03-15 MED ORDER — OXYCODONE-ACETAMINOPHEN 5-325 MG PO TABS: 2.0000 | ORAL_TABLET | ORAL | Status: DC | PRN

## 2021-03-15 MED ORDER — OXYCODONE-ACETAMINOPHEN 5-325 MG PO TABS: 1.0000 | ORAL_TABLET | ORAL | Status: DC | PRN

## 2021-03-15 MED ORDER — PROPRANOLOL HCL 10 MG PO TABS
10.0000 mg | ORAL_TABLET | Freq: Every day | ORAL | Status: DC
  Administered 2021-03-16: 10 mg via ORAL
  Filled 2021-03-15 (×3): qty 1

## 2021-03-15 MED ORDER — SODIUM CHLORIDE 0.9 % IV SOLN
5.0000 10*6.[IU] | Freq: Once | INTRAVENOUS | Status: AC
  Administered 2021-03-15: 5 10*6.[IU] via INTRAVENOUS
  Filled 2021-03-15: qty 5

## 2021-03-15 MED ORDER — DIPHENHYDRAMINE HCL 50 MG/ML IJ SOLN: 12.5000 mg | INTRAMUSCULAR | Status: DC | PRN

## 2021-03-15 MED ORDER — PENICILLIN G POT IN DEXTROSE 60000 UNIT/ML IV SOLN
3.0000 10*6.[IU] | INTRAVENOUS | Status: DC
Start: 2021-03-16 — End: 2021-03-16
  Administered 2021-03-16 (×2): 3 10*6.[IU] via INTRAVENOUS
  Filled 2021-03-15 (×2): qty 50

## 2021-03-15 MED ORDER — EPHEDRINE 5 MG/ML INJ: 10.0000 mg | INTRAVENOUS | Status: DC | PRN

## 2021-03-15 MED ORDER — SOD CITRATE-CITRIC ACID 500-334 MG/5ML PO SOLN
30.0000 mL | ORAL | Status: DC | PRN
  Filled 2021-03-15: qty 30

## 2021-03-15 MED ORDER — LACTATED RINGERS IV SOLN: 500.0000 mL | Freq: Once | INTRAVENOUS | Status: DC

## 2021-03-15 MED ORDER — LACTATED RINGERS IV SOLN: INTRAVENOUS | Status: DC

## 2021-03-15 MED ORDER — FENTANYL-BUPIVACAINE-NACL 0.5-0.125-0.9 MG/250ML-% EP SOLN
12.0000 mL/h | EPIDURAL | Status: DC | PRN
  Administered 2021-03-15: 12 mL/h via EPIDURAL
  Filled 2021-03-15: qty 250

## 2021-03-15 MED ORDER — LACTATED RINGERS IV SOLN
500.0000 mL | INTRAVENOUS | Status: DC | PRN
Start: 2021-03-15 — End: 2021-03-16

## 2021-03-15 MED ORDER — LIDOCAINE HCL (PF) 1 % IJ SOLN: 30.0000 mL | INTRAMUSCULAR | Status: DC | PRN

## 2021-03-15 MED ORDER — FENTANYL CITRATE (PF) 100 MCG/2ML IJ SOLN
50.0000 ug | INTRAMUSCULAR | Status: DC | PRN
Start: 2021-03-15 — End: 2021-03-16

## 2021-03-15 MED ORDER — ONDANSETRON HCL 4 MG/2ML IJ SOLN: 4.0000 mg | Freq: Four times a day (QID) | INTRAMUSCULAR | Status: DC | PRN

## 2021-03-15 MED ORDER — OXYTOCIN-SODIUM CHLORIDE 30-0.9 UT/500ML-% IV SOLN
2.5000 [IU]/h | INTRAVENOUS | Status: DC
  Filled 2021-03-15: qty 500

## 2021-03-15 MED ORDER — PHENYLEPHRINE 40 MCG/ML (10ML) SYRINGE FOR IV PUSH (FOR BLOOD PRESSURE SUPPORT)
80.0000 ug | PREFILLED_SYRINGE | INTRAVENOUS | Status: DC | PRN
  Filled 2021-03-15: qty 10

## 2021-03-15 NOTE — H&P (Addendum)
Obstetrics Admission History & Physical  03/15/2021 - 9:11 PM Primary OBGYN: [facility]  Chief Complaint: SROM at 1700 today. Contractions  History of Present Illness  35 y.o. [id number] @ [redacted]w[redacted]d, with the above CC. Pregnancy complicated by: Di-Di twins, cerclage that was removed a few days ago.  [name] states that she had LOF late this afternoon and contractions, no VB  Review of Systems: as noted in the History of Present Illness.  Patient Active Problem List   Diagnosis Date Noted   Pregnancy, twins, antepartum 03/15/2021   GBS (group B Streptococcus carrier), +RV culture, currently pregnant 03/11/2021   Trichomoniasis 02/11/2021   Cervical cerclage suture present in third trimester 02/10/2021   Dichorionic diamniotic twin pregnancy, antepartum 11/24/2020   Short cervix affecting pregnancy 11/24/2020   AMA (advanced maternal age) multigravida 35+ 11/24/2020   Alpha thalassemia silent carrier 10/14/2020   Supervision of high risk pregnancy, antepartum 09/01/2020   Depression 08/24/2020     PMHx:  Past Medical History:  Diagnosis Date   Anemia    Migraine    Vaginal Pap smear, abnormal    PSHx:  Past Surgical History:  Procedure Laterality Date   CERVICAL CERCLAGE     CERVICAL CERCLAGE N/A 10/08/2020   Procedure: CERCLAGE CERVICAL;  Surgeon: Adam Phenix, MD;  Location: [facility] LD [facility]S;  Service: Gynecology;  Laterality: N/A;   CERVICAL CONE BIOPSY     Medications:  Medications Prior to Admission  Medication Sig Dispense Refill Last Dose   Blood Pressure Monitoring (BLOOD PRESSURE MONITOR AUTOMAT) DEVI 1 Device by Does not apply route daily. Automatic blood pressure cuff regular size. To monitor blood pressure regularly at home. ICD-10 code: O09.90 1 each 0    docusate sodium (COLACE) 100 MG capsule Take 1 capsule (100 mg total) by mouth 2 (two) times daily as needed. 30 capsule 2    Elastic Bandages & Supports (COMFORT FIT MATERNITY SUPP LG) MISC 1 Units by Does not  apply route daily. 1 each 0    ferrous sulfate (FERROUSUL) 325 (65 FE) MG tablet Take 1 tablet (325 mg total) by mouth every other day. 30 tablet 3    folic acid (FOLVITE) 1 MG tablet Take 1 tablet (1 mg total) by mouth daily. 90 tablet 4    metroNIDAZOLE (FLAGYL) 500 MG tablet Take 1 tablet (500 mg total) by mouth 2 (two) times daily. 14 tablet 0    Misc. Devices (GOJJI WEIGHT SCALE) MISC 1 Device by Does not apply route daily as needed. To weight self daily as needed at home. ICD-10 code: O09.90 1 each 0    Prenat-Fe Poly-Methfol-FA-DHA (VITAFOL ULTRA) 29-0.6-0.4-200 MG CAPS Take 1 tablet by mouth daily. 90 capsule 4    propranolol (INDERAL) 10 MG tablet Take 1 tablet (10 mg total) by mouth 2 (two) times daily as needed. Take and bedtime and as needed 30 tablet 0      Allergies: has No Known Allergies. OBHx:  OB History  Gravida Para Term Preterm AB Living  5 3 3   1 3   SAB IAB Ectopic Multiple Live Births  1       3    # Outcome Date GA Lbr Len/2nd Weight Sex Delivery Anes PTL Lv  5 Current           4 Term 11/25/13 [redacted]w[redacted]d  3459 g M Vag-Spont   LIV  3 Term 04/20/12 [redacted]w[redacted]d  3402 g [redacted]w[redacted]d LIV  2 Term 06/20/10 [redacted]w[redacted]d  4479 g F Vag-Spont   LIV  1 SAB 02/26/10 [redacted]w[redacted]d                 FHx:  Family History  Problem Relation Age of Onset   Hypertension Mother    Diabetes Mother    Soc Hx:  Social History   Socioeconomic History   Marital status: Single    Spouse name: Not on file   Number of children: 3   Years of education: Not on file   Highest education level: Associate degree: occupational, Scientist, product/process development, or vocational program  Occupational History   Not on file  Tobacco Use   Smoking status: Never   Smokeless tobacco: Never  Vaping Use   Vaping Use: Never used  Substance and Sexual Activity   Alcohol use: No    Alcohol/week: 0.0 standard drinks   Drug use: No   Sexual activity: Yes    Birth control/protection: None    Comment: IUD removed Sept 2021  Other Topics  Concern   Not on file  Social History Narrative   Not on file   Social Determinants of Health   Financial Resource Strain: Not on file  Food Insecurity: Not on file  Transportation Needs: Not on file  Physical Activity: Not on file  Stress: Not on file  Social Connections: Not on file  Intimate Partner Violence: Not on file    Objective    Current Vital Signs 24h Vital Sign Ranges  T 98.1 F (36.7 C) Temp  Avg: 98.1 F (36.7 C)  Min: 98.1 F (36.7 C)  Max: 98.1 F (36.7 C)  BP 110/66 BP  Min: 110/66  Max: 110/66  HR (!) 106  Pulse  Avg: 106  Min: 106  Max: 106  RR 16 Resp  Avg: 16  Min: 16  Max: 16  SaO2 100 %   SpO2  Avg: 100 %  Min: 100 %  Max: 100 %       24 Hour I/O Current Shift I/O  Time Ins Outs No intake/output data recorded. No intake/output data recorded.   EFM:  180 baseline no accel, no decel, mod variability 160 baseline +accel, no decel, mod variability Toco: q59m  General: Well nourished, well developed female in no acute distress.  Skin:  Warm and dry.  Cardiovascular: S1, S2 normal, no murmur, rub or gallop, regular rate and rhythm Respiratory:  Clear to auscultation bilateral. Normal respiratory effort Abdomen: gravid, nttp Neuro/Psych:  Normal mood and affect.  Bedside u/s: cephalic, cephalic  Labs  Pending: covid and rpr A POS  Recent Labs  Lab 03/15/21 2005  WBC 10.8*  HGB 12.0  HCT 36.7  PLT 153    Radiology 10/25 bpp: cephalic, breech. 8/8 x 2 10/18: cephalic/cephalic with 12% discordance: A 2615gm, 41%, ac 56% and B 2298gm, 13%, AC 15%  Assessment & Plan   35 y.o. [id number] @ [redacted]w[redacted]d with pt stable *Pregnancy: Category II tracing. F/u EFM after void. 1L IVF bolus if still with fetal tachycardia *Di-Di twins: pt desires vaginal delivery. I told her she is a great candidate for this. Risk of need for breech extraction for twin B d/w her and risk of head entrapment and maternal/fetal risks, need for stat c-section, etc. Pt desires  to proceed with vaginal delivery. I told her that I prefer to deliver in the OR but if she delivers after 0800 tomorrow that oncoming MD may be okay with in the room. I d/w her that I recommend  early epidural due to twin pregnancy and delivery and to help facilitate any need for breech extraction as well if need for stat c-section.  Pt needs to go void. RN to check cx when patient comes back.  *GBS positive: start PCN now *Analgesia: see above *h/o heart palps: seen by OB cards. Continue inderal 10mg  qhs  MD Attending [facility])

## 2021-03-15 NOTE — Anesthesia Preprocedure Evaluation (Signed)
Anesthesia Evaluation  Patient identified by MRN, date of birth, ID band Patient awake    Reviewed: Allergy & Precautions, Patient's Chart, lab work & pertinent test results  History of Anesthesia Complications Negative for: history of anesthetic complications  Airway Mallampati: II  TM Distance: >3 FB Neck ROM: Full    Dental no notable dental hx.    Pulmonary neg pulmonary ROS,    Pulmonary exam normal        Cardiovascular negative cardio ROS Normal cardiovascular exam     Neuro/Psych  Headaches, Depression    GI/Hepatic negative GI ROS, Neg liver ROS,   Endo/Other  negative endocrine ROS  Renal/GU negative Renal ROS  negative genitourinary   Musculoskeletal negative musculoskeletal ROS (+)   Abdominal   Peds  Hematology negative hematology ROS (+)   Anesthesia Other Findings Day of surgery medications reviewed with patient.  Reproductive/Obstetrics (+) Pregnancy                             Anesthesia Physical Anesthesia Plan  ASA: 3  Anesthesia Plan: Epidural   Post-op Pain Management:    Induction:   PONV Risk Score and Plan: Treatment may vary due to age or medical condition  Airway Management Planned: Natural Airway  Additional Equipment:   Intra-op Plan:   Post-operative Plan:   Informed Consent: I have reviewed the patients History and Physical, chart, labs and discussed the procedure including the risks, benefits and alternatives for the proposed anesthesia with the patient or authorized representative who has indicated his/her understanding and acceptance.       Plan Discussed with:   Anesthesia Plan Comments:         Anesthesia Quick Evaluation

## 2021-03-15 NOTE — MAU Note (Signed)
..  Andrea Logan is a 35 y.o. at [redacted]w[redacted]d here in MAU reporting: leaking of fluid since 5 pm and irregular contractions. +FM. Denies vaginal bleeding.  Cerclage was removed on Monday.   Pain score: 10/10 with contractions.  Vitals:   03/15/21 1922  BP: 110/66  Pulse: (!) 106  Resp: 16  Temp: 98.1 F (36.7 C)  SpO2: 100%     HBZ:JIRCV. +FM

## 2021-03-16 ENCOUNTER — Encounter: Payer: Medicaid Other | Admitting: Obstetrics and Gynecology

## 2021-03-16 ENCOUNTER — Encounter (HOSPITAL_COMMUNITY): Payer: Self-pay | Admitting: Obstetrics and Gynecology

## 2021-03-16 ENCOUNTER — Encounter (HOSPITAL_COMMUNITY)
Admission: AD | Disposition: A | Discharge status: Home / Self Care | Payer: Self-pay | Source: Home / Self Care | Attending: Family Medicine

## 2021-03-16 ENCOUNTER — Inpatient Hospital Stay (HOSPITAL_COMMUNITY): Payer: Medicaid Other

## 2021-03-16 DIAGNOSIS — O4202 Full-term premature rupture of membranes, onset of labor within 24 hours of rupture: Secondary | ICD-10-CM | POA: Diagnosis not present

## 2021-03-16 DIAGNOSIS — O30003 Twin pregnancy, unspecified number of placenta and unspecified number of amniotic sacs, third trimester: Secondary | ICD-10-CM | POA: Diagnosis not present

## 2021-03-16 DIAGNOSIS — Z98891 History of uterine scar from previous surgery: Secondary | ICD-10-CM

## 2021-03-16 DIAGNOSIS — O99824 Streptococcus B carrier state complicating childbirth: Secondary | ICD-10-CM | POA: Diagnosis not present

## 2021-03-16 DIAGNOSIS — O30049 Twin pregnancy, dichorionic/diamniotic, unspecified trimester: Secondary | ICD-10-CM | POA: Diagnosis not present

## 2021-03-16 DIAGNOSIS — O99892 Other specified diseases and conditions complicating childbirth: Secondary | ICD-10-CM | POA: Diagnosis not present

## 2021-03-16 DIAGNOSIS — Z4889 Encounter for other specified surgical aftercare: Secondary | ICD-10-CM | POA: Diagnosis not present

## 2021-03-16 DIAGNOSIS — O411292 Chorioamnionitis, unspecified trimester, fetus 2: Secondary | ICD-10-CM | POA: Diagnosis not present

## 2021-03-16 DIAGNOSIS — Z3A37 37 weeks gestation of pregnancy: Secondary | ICD-10-CM | POA: Diagnosis not present

## 2021-03-16 DIAGNOSIS — Z3A00 Weeks of gestation of pregnancy not specified: Secondary | ICD-10-CM | POA: Diagnosis not present

## 2021-03-16 DIAGNOSIS — O30043 Twin pregnancy, dichorionic/diamniotic, third trimester: Secondary | ICD-10-CM | POA: Diagnosis not present

## 2021-03-16 DIAGNOSIS — O328XX2 Maternal care for other malpresentation of fetus, fetus 2: Secondary | ICD-10-CM | POA: Diagnosis not present

## 2021-03-16 LAB — RPR: RPR Ser Ql: NONREACTIVE

## 2021-03-16 LAB — CBC
HCT: 32.1 % — ABNORMAL LOW (ref 36.0–46.0)
Hemoglobin: 10.1 g/dL — ABNORMAL LOW (ref 12.0–15.0)
MCH: 27.4 pg (ref 26.0–34.0)
MCHC: 31.5 g/dL (ref 30.0–36.0)
MCV: 87.2 fL (ref 80.0–100.0)
Platelets: 149 10*3/uL — ABNORMAL LOW (ref 150–400)
RBC: 3.68 MIL/uL — ABNORMAL LOW (ref 3.87–5.11)
RDW: 17 % — ABNORMAL HIGH (ref 11.5–15.5)
WBC: 24.7 10*3/uL — ABNORMAL HIGH (ref 4.0–10.5)
nRBC: 0.1 % (ref 0.0–0.2)

## 2021-03-16 SURGERY — Surgical Case
Anesthesia: Epidural

## 2021-03-16 MED ORDER — METRONIDAZOLE 500 MG PO TABS
500.0000 mg | ORAL_TABLET | Freq: Two times a day (BID) | ORAL | Status: DC
  Filled 2021-03-16 (×2): qty 1

## 2021-03-16 MED ORDER — TRANEXAMIC ACID-NACL 1000-0.7 MG/100ML-% IV SOLN
INTRAVENOUS | Status: DC | PRN
  Administered 2021-03-16: 1000 mg via INTRAVENOUS

## 2021-03-16 MED ORDER — ACETAMINOPHEN 500 MG PO TABS
1000.0000 mg | ORAL_TABLET | Freq: Four times a day (QID) | ORAL | Status: DC
  Administered 2021-03-16 – 2021-03-19 (×11): 1000 mg via ORAL
  Filled 2021-03-16 (×11): qty 2

## 2021-03-16 MED ORDER — LACTATED RINGERS IV BOLUS
1000.0000 mL | Freq: Once | INTRAVENOUS | Status: AC
  Administered 2021-03-16: 1000 mL via INTRAVENOUS

## 2021-03-16 MED ORDER — PHENYLEPHRINE 40 MCG/ML (10ML) SYRINGE FOR IV PUSH (FOR BLOOD PRESSURE SUPPORT)
PREFILLED_SYRINGE | INTRAVENOUS | Status: DC | PRN
  Administered 2021-03-16: 120 ug via INTRAVENOUS
  Administered 2021-03-16: 200 ug via INTRAVENOUS
  Administered 2021-03-16: 120 ug via INTRAVENOUS
  Administered 2021-03-16 (×2): 200 ug via INTRAVENOUS

## 2021-03-16 MED ORDER — OXYTOCIN-SODIUM CHLORIDE 30-0.9 UT/500ML-% IV SOLN: 2.5000 [IU]/h | INTRAVENOUS | Status: AC

## 2021-03-16 MED ORDER — DIBUCAINE (PERIANAL) 1 % EX OINT: 1.0000 "application " | TOPICAL_OINTMENT | CUTANEOUS | Status: DC | PRN

## 2021-03-16 MED ORDER — CEFAZOLIN SODIUM-DEXTROSE 2-3 GM-%(50ML) IV SOLR
INTRAVENOUS | Status: DC | PRN
  Administered 2021-03-16: 2 g via INTRAVENOUS

## 2021-03-16 MED ORDER — KETOROLAC TROMETHAMINE 30 MG/ML IJ SOLN
30.0000 mg | Freq: Four times a day (QID) | INTRAMUSCULAR | Status: AC | PRN
  Administered 2021-03-16: 30 mg via INTRAVENOUS
  Filled 2021-03-16: qty 1

## 2021-03-16 MED ORDER — FENTANYL CITRATE (PF) 100 MCG/2ML IJ SOLN
INTRAMUSCULAR | Status: AC
  Filled 2021-03-16: qty 2

## 2021-03-16 MED ORDER — NALBUPHINE HCL 10 MG/ML IJ SOLN: 5.0000 mg | INTRAMUSCULAR | Status: DC | PRN

## 2021-03-16 MED ORDER — PHENYLEPHRINE 40 MCG/ML (10ML) SYRINGE FOR IV PUSH (FOR BLOOD PRESSURE SUPPORT)
PREFILLED_SYRINGE | INTRAVENOUS | Status: AC
  Filled 2021-03-16: qty 20

## 2021-03-16 MED ORDER — KETOROLAC TROMETHAMINE 30 MG/ML IJ SOLN: 30.0000 mg | Freq: Once | INTRAMUSCULAR | Status: DC | PRN

## 2021-03-16 MED ORDER — IBUPROFEN 600 MG PO TABS
600.0000 mg | ORAL_TABLET | Freq: Four times a day (QID) | ORAL | Status: DC | PRN
  Administered 2021-03-19: 600 mg via ORAL
  Filled 2021-03-16: qty 1

## 2021-03-16 MED ORDER — ONDANSETRON HCL 4 MG/2ML IJ SOLN
INTRAMUSCULAR | Status: DC | PRN
  Administered 2021-03-16: 4 mg via INTRAVENOUS

## 2021-03-16 MED ORDER — NALBUPHINE HCL 10 MG/ML IJ SOLN: 5.0000 mg | Freq: Once | INTRAMUSCULAR | Status: DC | PRN

## 2021-03-16 MED ORDER — SCOPOLAMINE 1 MG/3DAYS TD PT72
1.0000 | MEDICATED_PATCH | Freq: Once | TRANSDERMAL | Status: DC
  Filled 2021-03-16: qty 1

## 2021-03-16 MED ORDER — SIMETHICONE 80 MG PO CHEW
80.0000 mg | CHEWABLE_TABLET | Freq: Three times a day (TID) | ORAL | Status: DC
  Administered 2021-03-17 – 2021-03-19 (×6): 80 mg via ORAL
  Filled 2021-03-16 (×6): qty 1

## 2021-03-16 MED ORDER — DIPHENHYDRAMINE HCL 25 MG PO CAPS: 25.0000 mg | ORAL_CAPSULE | ORAL | Status: DC | PRN

## 2021-03-16 MED ORDER — SODIUM CHLORIDE 0.9% FLUSH: 3.0000 mL | INTRAVENOUS | Status: DC | PRN

## 2021-03-16 MED ORDER — DEXAMETHASONE SODIUM PHOSPHATE 10 MG/ML IJ SOLN
INTRAMUSCULAR | Status: DC | PRN
  Administered 2021-03-16: 10 mg via INTRAVENOUS

## 2021-03-16 MED ORDER — NALBUPHINE HCL 10 MG/ML IJ SOLN
5.0000 mg | Freq: Once | INTRAMUSCULAR | Status: DC | PRN
Start: 2021-03-16 — End: 2021-03-19

## 2021-03-16 MED ORDER — SENNOSIDES-DOCUSATE SODIUM 8.6-50 MG PO TABS
2.0000 | ORAL_TABLET | Freq: Every day | ORAL | Status: DC
  Administered 2021-03-17 – 2021-03-19 (×3): 2 via ORAL
  Filled 2021-03-16 (×3): qty 2

## 2021-03-16 MED ORDER — LIDOCAINE-EPINEPHRINE (PF) 2 %-1:200000 IJ SOLN
INTRAMUSCULAR | Status: AC
  Filled 2021-03-16: qty 40

## 2021-03-16 MED ORDER — LIDOCAINE-EPINEPHRINE (PF) 2 %-1:200000 IJ SOLN
INTRAMUSCULAR | Status: DC | PRN
  Administered 2021-03-16: 5 mL via EPIDURAL
  Administered 2021-03-16: 10 mL via EPIDURAL
  Administered 2021-03-16: 5 mL via EPIDURAL

## 2021-03-16 MED ORDER — NALOXONE HCL 4 MG/10ML IJ SOLN
1.0000 ug/kg/h | INTRAVENOUS | Status: DC | PRN
  Filled 2021-03-16: qty 5

## 2021-03-16 MED ORDER — MENTHOL 3 MG MT LOZG: 1.0000 | LOZENGE | OROMUCOSAL | Status: DC | PRN

## 2021-03-16 MED ORDER — ACETAMINOPHEN 500 MG PO TABS: 1000.0000 mg | ORAL_TABLET | Freq: Four times a day (QID) | ORAL | Status: DC

## 2021-03-16 MED ORDER — NALOXONE HCL 0.4 MG/ML IJ SOLN: 0.4000 mg | INTRAMUSCULAR | Status: DC | PRN

## 2021-03-16 MED ORDER — PRENATAL MULTIVITAMIN CH
1.0000 | ORAL_TABLET | Freq: Every day | ORAL | Status: DC
  Administered 2021-03-17 – 2021-03-19 (×3): 1 via ORAL
  Filled 2021-03-16 (×3): qty 1

## 2021-03-16 MED ORDER — ACETAMINOPHEN 10 MG/ML IV SOLN
INTRAVENOUS | Status: AC
  Filled 2021-03-16: qty 100

## 2021-03-16 MED ORDER — TETANUS-DIPHTH-ACELL PERTUSSIS 5-2.5-18.5 LF-MCG/0.5 IM SUSY: 0.5000 mL | PREFILLED_SYRINGE | Freq: Once | INTRAMUSCULAR | Status: DC

## 2021-03-16 MED ORDER — OXYTOCIN-SODIUM CHLORIDE 30-0.9 UT/500ML-% IV SOLN
INTRAVENOUS | Status: DC | PRN
  Administered 2021-03-16: 300 mL via INTRAVENOUS
  Administered 2021-03-16: 200 mL via INTRAVENOUS

## 2021-03-16 MED ORDER — CEFAZOLIN SODIUM-DEXTROSE 2-4 GM/100ML-% IV SOLN
INTRAVENOUS | Status: AC
  Filled 2021-03-16: qty 100

## 2021-03-16 MED ORDER — WITCH HAZEL-GLYCERIN EX PADS: 1.0000 "application " | MEDICATED_PAD | CUTANEOUS | Status: DC | PRN

## 2021-03-16 MED ORDER — ACETAMINOPHEN 10 MG/ML IV SOLN
INTRAVENOUS | Status: DC | PRN
  Administered 2021-03-16: 1000 mg via INTRAVENOUS

## 2021-03-16 MED ORDER — LIDOCAINE HCL (PF) 1 % IJ SOLN
INTRAMUSCULAR | Status: DC | PRN
  Administered 2021-03-15 (×2): 4 mL via EPIDURAL

## 2021-03-16 MED ORDER — DEXTROSE 5 % IV SOLN: INTRAVENOUS | Status: DC | PRN

## 2021-03-16 MED ORDER — DIPHENHYDRAMINE HCL 25 MG PO CAPS: 25.0000 mg | ORAL_CAPSULE | Freq: Four times a day (QID) | ORAL | Status: DC | PRN

## 2021-03-16 MED ORDER — ENOXAPARIN SODIUM 60 MG/0.6ML IJ SOSY
55.0000 mg | PREFILLED_SYRINGE | INTRAMUSCULAR | Status: DC
  Administered 2021-03-17 – 2021-03-19 (×3): 55 mg via SUBCUTANEOUS
  Filled 2021-03-16 (×3): qty 0.6

## 2021-03-16 MED ORDER — FERROUS SULFATE 325 (65 FE) MG PO TABS
325.0000 mg | ORAL_TABLET | ORAL | Status: DC
  Administered 2021-03-17 – 2021-03-19 (×2): 325 mg via ORAL
  Filled 2021-03-16 (×2): qty 1

## 2021-03-16 MED ORDER — MORPHINE SULFATE (PF) 0.5 MG/ML IJ SOLN
INTRAMUSCULAR | Status: AC
  Filled 2021-03-16: qty 10

## 2021-03-16 MED ORDER — FENTANYL CITRATE (PF) 100 MCG/2ML IJ SOLN
INTRAMUSCULAR | Status: DC | PRN
  Administered 2021-03-16: 100 ug via EPIDURAL

## 2021-03-16 MED ORDER — ONDANSETRON HCL 4 MG/2ML IJ SOLN
INTRAMUSCULAR | Status: AC
  Filled 2021-03-16: qty 2

## 2021-03-16 MED ORDER — EPHEDRINE SULFATE-NACL 50-0.9 MG/10ML-% IV SOSY
PREFILLED_SYRINGE | INTRAVENOUS | Status: DC | PRN
  Administered 2021-03-16: 10 mg via INTRAVENOUS

## 2021-03-16 MED ORDER — ZOLPIDEM TARTRATE 5 MG PO TABS: 5.0000 mg | ORAL_TABLET | Freq: Every evening | ORAL | Status: DC | PRN

## 2021-03-16 MED ORDER — COCONUT OIL OIL
1.0000 "application " | TOPICAL_OIL | Status: DC | PRN
  Administered 2021-03-17: 1 via TOPICAL

## 2021-03-16 MED ORDER — DEXAMETHASONE SODIUM PHOSPHATE 10 MG/ML IJ SOLN
INTRAMUSCULAR | Status: AC
  Filled 2021-03-16: qty 1

## 2021-03-16 MED ORDER — DEXMEDETOMIDINE (PRECEDEX) IN NS 20 MCG/5ML (4 MCG/ML) IV SYRINGE
PREFILLED_SYRINGE | INTRAVENOUS | Status: DC | PRN
  Administered 2021-03-16: 12 ug via INTRAVENOUS

## 2021-03-16 MED ORDER — OXYCODONE HCL 5 MG PO TABS
5.0000 mg | ORAL_TABLET | ORAL | Status: DC | PRN
  Administered 2021-03-17 – 2021-03-19 (×7): 5 mg via ORAL
  Filled 2021-03-16 (×7): qty 1

## 2021-03-16 MED ORDER — KETOROLAC TROMETHAMINE 30 MG/ML IJ SOLN: 30.0000 mg | Freq: Four times a day (QID) | INTRAMUSCULAR | Status: AC | PRN

## 2021-03-16 MED ORDER — METHYLERGONOVINE MALEATE 0.2 MG/ML IJ SOLN
INTRAMUSCULAR | Status: AC
  Filled 2021-03-16: qty 1

## 2021-03-16 MED ORDER — SIMETHICONE 80 MG PO CHEW
80.0000 mg | CHEWABLE_TABLET | ORAL | Status: DC | PRN
  Administered 2021-03-17: 80 mg via ORAL

## 2021-03-16 MED ORDER — DIPHENHYDRAMINE HCL 50 MG/ML IJ SOLN: 12.5000 mg | INTRAMUSCULAR | Status: DC | PRN

## 2021-03-16 MED ORDER — DEXMEDETOMIDINE (PRECEDEX) IN NS 20 MCG/5ML (4 MCG/ML) IV SYRINGE
PREFILLED_SYRINGE | INTRAVENOUS | Status: AC
  Filled 2021-03-16: qty 5

## 2021-03-16 MED ORDER — MORPHINE SULFATE (PF) 0.5 MG/ML IJ SOLN
INTRAMUSCULAR | Status: DC | PRN
  Administered 2021-03-16: 3 mg via EPIDURAL

## 2021-03-16 MED ORDER — METHYLERGONOVINE MALEATE 0.2 MG/ML IJ SOLN
INTRAMUSCULAR | Status: DC | PRN
  Administered 2021-03-16: .2 mg via INTRAMUSCULAR

## 2021-03-16 MED ORDER — FENTANYL CITRATE (PF) 100 MCG/2ML IJ SOLN: 25.0000 ug | INTRAMUSCULAR | Status: DC | PRN

## 2021-03-16 MED ORDER — ONDANSETRON HCL 4 MG/2ML IJ SOLN: 4.0000 mg | Freq: Three times a day (TID) | INTRAMUSCULAR | Status: DC | PRN

## 2021-03-16 MED ORDER — IBUPROFEN 600 MG PO TABS
600.0000 mg | ORAL_TABLET | Freq: Four times a day (QID) | ORAL | Status: AC
  Administered 2021-03-16 – 2021-03-18 (×6): 600 mg via ORAL
  Filled 2021-03-16 (×6): qty 1

## 2021-03-16 SURGICAL SUPPLY — 34 items
CHLORAPREP W/TINT 26ML (MISCELLANEOUS) ×2 IMPLANT
CLAMP CORD UMBIL (MISCELLANEOUS) IMPLANT
CLOTH BEACON ORANGE TIMEOUT ST (SAFETY) ×2 IMPLANT
DRSG OPSITE POSTOP 4X10 (GAUZE/BANDAGES/DRESSINGS) ×2 IMPLANT
ELECT REM PT RETURN 9FT ADLT (ELECTROSURGICAL) ×2
ELECTRODE REM PT RTRN 9FT ADLT (ELECTROSURGICAL) ×1 IMPLANT
EXTRACTOR VACUUM BELL STYLE (SUCTIONS) IMPLANT
GLOVE BIOGEL PI IND STRL 7.0 (GLOVE) ×2 IMPLANT
GLOVE BIOGEL PI INDICATOR 7.0 (GLOVE) ×2
GLOVE ECLIPSE 7.0 STRL STRAW (GLOVE) ×2 IMPLANT
GOWN STRL REUS W/TWL LRG LVL3 (GOWN DISPOSABLE) ×4 IMPLANT
HEMOSTAT ARISTA ABSORB 3G PWDR (HEMOSTASIS) ×2 IMPLANT
KIT ABG SYR 3ML LUER SLIP (SYRINGE) ×2 IMPLANT
NEEDLE HYPO 25X5/8 SAFETYGLIDE (NEEDLE) ×2 IMPLANT
NS IRRIG 1000ML POUR BTL (IV SOLUTION) ×2 IMPLANT
PACK C SECTION WH (CUSTOM PROCEDURE TRAY) ×2 IMPLANT
PAD ABD DERMACEA PRESS 5X9 (GAUZE/BANDAGES/DRESSINGS) ×2 IMPLANT
PAD OB MATERNITY 4.3X12.25 (PERSONAL CARE ITEMS) ×2 IMPLANT
PENCIL SMOKE EVAC W/HOLSTER (ELECTROSURGICAL) ×2 IMPLANT
RTRCTR C-SECT PINK 25CM LRG (MISCELLANEOUS) ×2 IMPLANT
SUT MNCRL 0 VIOLET CTX 36 (SUTURE) ×2 IMPLANT
SUT MONOCRYL 0 CTX 36 (SUTURE) ×2
SUT PLAIN 0 NONE (SUTURE) IMPLANT
SUT PLAIN 2 0 (SUTURE)
SUT PLAIN 2 0 XLH (SUTURE) IMPLANT
SUT PLAIN ABS 2-0 CT1 27XMFL (SUTURE) IMPLANT
SUT VIC AB 0 CTX 36 (SUTURE) ×1
SUT VIC AB 0 CTX36XBRD ANBCTRL (SUTURE) ×1 IMPLANT
SUT VIC AB 2-0 CT1 27 (SUTURE) ×1
SUT VIC AB 2-0 CT1 TAPERPNT 27 (SUTURE) ×1 IMPLANT
SUT VIC AB 4-0 KS 27 (SUTURE) ×2 IMPLANT
TOWEL OR 17X24 6PK STRL BLUE (TOWEL DISPOSABLE) ×2 IMPLANT
TRAY FOLEY W/BAG SLVR 14FR LF (SET/KITS/TRAYS/PACK) IMPLANT
WATER STERILE IRR 1000ML POUR (IV SOLUTION) ×2 IMPLANT

## 2021-03-16 NOTE — Progress Notes (Signed)
Labor Progress Note Andrea Logan is a 35 y.o. [id number] at [redacted]w[redacted]d presented for SROM.   Checked in on patient due to minimal variability of Twin A for the past ~hour. Given 500cc bolus and attempted to turn to left lateral side, however Twin A did not tolerate. Placed back on right lateral again with improvement.   O:  BP 114/67   Pulse 87   Temp 98.7 F (37.1 C) (Oral)   Resp 16   Ht 5\' 9"  (1.753 m)   Wt 115.3 kg   LMP 06/28/2020 (Exact Date)   SpO2 100%   BMI 37.55 kg/m   Twin A: 140/min-mod/none Twin B: 140/mod/none   CVE: Dilation: 5 Effacement (%): 90 Station: -2 Presentation: Vertex  Exam by:: [email or web address]   A&P: 35 y.o. 31 [redacted]w[redacted]d  #Labor/FWB: Some progression since last check, contracting on her own about every 2 minutes and will continue expectant management. Variability appears to be improving to moderate on occasion for both, but still minimal mostly. Adjusted probes numerous times as HR for both appear to be extremely close, however via [redacted]w[redacted]d does appear to be in appropriate locations for each. Recommended FSE placement to better discern two separate HR, however patient declined at this time. Will continue to monitor. Dr. [place] made aware of FHT.  #Pain: Epidural in place  #GBS positive PCN   Vergie Living, DO 5:46 AM

## 2021-03-16 NOTE — Lactation Note (Signed)
This note was copied from a baby's chart. Lactation Consultation Note  Patient Name: Andrea Logan Today's Date: 03/16/2021 Age:35 hours P5 ( Multiple gestation of LPTI's ) , less than 5 lbs, mom 's feeding choice is breast and formula feeding. LC entered the room, per mom she is very tired and would like to rest and is in pain, LC alert RN that mom is in pain, Mom would like LC services to come back around 1900 pm today to help assist with latching twins at the breast. .  Maternal Data    Feeding    LATCH Score                    Lactation Tools Discussed/Used    Interventions    Discharge    Consult Status      Andrea Logan 03/16/2021, 3:49 PM

## 2021-03-16 NOTE — Anesthesia Postprocedure Evaluation (Signed)
Anesthesia Post Note  Patient: Music therapist  Procedure(s) Performed: CESAREAN SECTION     Patient location during evaluation: PACU Anesthesia Type: Epidural Level of consciousness: oriented and awake and alert Pain management: pain level controlled Vital Signs Assessment: post-procedure vital signs reviewed and stable Respiratory status: spontaneous breathing, respiratory function stable and patient connected to nasal cannula oxygen Cardiovascular status: blood pressure returned to baseline and stable Postop Assessment: no headache, no backache, no apparent nausea or vomiting and epidural receding Anesthetic complications: no   No notable events documented.  Last Vitals:  Vitals:   03/16/21 1202 03/16/21 1215  BP: (!) 89/67 96/83  Pulse: 88   Resp: 16 16  Temp:  36.7 C  SpO2: 100%     Last Pain:  Vitals:   03/16/21 1215  TempSrc: Axillary  PainSc: 0-No pain   Pain Goal: Patients Stated Pain Goal: 0 (03/15/21 1922)              Epidural/Spinal Function Cutaneous sensation: Tingles (03/16/21 1215), Patient able to flex knees: Yes (03/16/21 1215), Patient able to lift hips off bed: No (03/16/21 1215), Back pain beyond tenderness at insertion site: No (03/16/21 1215), Progressively worsening motor and/or sensory loss: No (03/16/21 1215), Bowel and/or bladder incontinence post epidural: No (03/16/21 1215)  Janifer Gieselman L Daemyn Gariepy

## 2021-03-16 NOTE — Progress Notes (Signed)
Evaluated patient at bedside due to noting fetal Twin B deceleration on monitor at approximately 0322. Lasted approximately 4.5 minutes into HR 70's. Twin A FHT reassuring during this time. Patient was lying on her back when occurred, placed in right lateral position with resolution. Ctx about every 2 minutes without augmentation. Dr. Vergie Living made aware. Will continue to monitor closely.   Twin A: 150/mod/none/none Twin B: Now 150s/mod/none   Allayne Stack, DO

## 2021-03-16 NOTE — Discharge Summary (Addendum)
Postpartum Discharge Summary      Patient Name: Andrea Logan DOB: 1985-09-21 MRN: [id number]  Date of admission: 03/15/2021 Delivery date:   Savi, Lastinger [[id number]]  03/16/2021    Anwitha, Mapes [[id number]]  03/16/2021  Delivering provider:    Kaliyah, Gladman [[id number]]  DAS, Novella Rob, Leretha Dykes [[id number]]  Janyth Pupa  Date of discharge: 03/19/2021  Admitting diagnosis: Pregnancy, twins, antepartum [O30.009] Intrauterine pregnancy: [redacted]w[redacted]d    Secondary diagnosis:  Active Problems:   Supervision of high risk pregnancy, antepartum   Alpha thalassemia silent carrier   Dichorionic diamniotic twin pregnancy, antepartum   AMA (advanced maternal age) multigravida 35+   Trichomoniasis   GBS (group B Streptococcus carrier), +RV culture, currently pregnant   Pregnancy, twins, antepartum   Heart palpitations   Delivery by emergency cesarean   Acute blood loss anemia  Additional problems: NONE    Discharge diagnosis: Term Pregnancy Delivered                                              Post partum procedures:blood transfusion & IV Venofer Augmentation: N/A Complications: [id number] Hospital course: Onset of Labor With Vaginal Delivery for twin A AND unplanned cesarean section for twin B due to malpresentation and fetal bradycardia (see Delivery Note and Op Note).    35y.o. yo [id number] admitted in Latent Labor on 03/15/2021. Patient had a labor course as follows:  Membrane Rupture Time/Date: Shylo, Zamor0[[id number]]5:30 PM    SmHouston, Surges0[[id number]]9:37 AM ,   SmRamaya, Guile0[[id number]]03/15/2021    SmClarinda, Obi0[[id number]]03/16/2021   Delivery Method:   SmMaren Beach0[[id number]]Vaginal, Spontaneous    Scaife, GiLeretha Dykes0[[id number]]C-Section, Low Transverse  Episiotomy:    SmReah, Justo0[[id number]]None    SmMakylee, Sanborn0[[id number]]None   Lacerations:     SmGerda, Yin0[[id number]]    SmJosie, Burleigh0[[id number]]  Patient had a postpartum course complicated by symptomatic anemia which improved after 2 units PRBC and one infusion of venofer.  She is ambulating, tolerating a regular diet, passing flatus, and urinating well. Patient is discharged home in stable condition on 03/19/21. She expressed a desire for her son to have a circumcision- she was consented and a note place in the infant's chart.  Newborn Data: Birth date:   SmBentlee, Drier0[[id number]]03/16/2021    SmEsther, Bradstreet0[[id number]]03/16/2021  Birth time:   SmPrisca, Gearing0[[id number]]9:29 AM    SmPanda, Crossin0[[id number]]9:45 AM  Gender:   SmDayanira, Giovannetti0[[id number]]Female    SmTonyia, Marschall0[[id number]]Female  Living status:   SmStefany, [address]0[[id number]]Living  Apgars:   SmLindell, Tussey0[[id number]]1    Jayne, GiMELADY CHOW0[[id number]]3 ,   SmSawyer, Kahan0[[id number]]9    SmBrinlee, Gambrell0[[id number]]9  Weight:   SmMiche, Loughridge0[[id number]]  8453    SmCharlaine, Utsey0[[id number]]2190 g   Magnesium Sulfate received: No BMZ received: No Rhophylac:N/A  MMR:No T-DaP: Patient refused Flu: No Transfusion:Yes  Physical exam  Vitals:   03/18/21 1732 03/18/21 1802 03/18/21 2020 03/19/21 0506  BP: 104/64 114/66 110/65 112/68  Pulse: 93 96 90 88  Resp: _0 Temp: 98.1 F (36.7 C) 98.4 F (36.9 C) 98.3 F (36.8 C) 98.3 F (36.8 C)  TempSrc: Oral Oral Oral Oral  SpO2: 100% 100% 100% 100%  Weight:      Height:       General: alert, cooperative, and no distress Lochia: appropriate Uterine Fundus: firm Incision: Healing well with no significant drainage, No significant erythema, Dressing is clean, dry, and intact DVT Evaluation: No evidence of DVT seen on physical exam. Negative Homan's sign. No cords  or calf tenderness. No significant calf/ankle edema. Labs: Lab Results  Component Value Date   WBC 12.7 (H) 03/18/2021   HGB 8.7 (L) 03/19/2021   HCT 27.6 (L) 03/19/2021   MCV 87.7 03/18/2021   PLT 139 (L) 03/18/2021   CMP Latest Ref Rng & Units 02/24/2021  Glucose 70 - 99 mg/dL 108(H)  BUN 6 - 20 mg/dL <5(L)  Creatinine 0.44 - 1.00 mg/dL 0.75  Sodium 135 - 145 mmol/L 135  Potassium 3.5 - 5.1 mmol/L 3.6  Chloride 98 - 111 mmol/L 108  CO2 22 - 32 mmol/L 19(L)  Calcium 8.9 - 10.3 mg/dL 9.1  Total Protein 6.5 - 8.1 g/dL 6.6  Total Bilirubin 0.3 - 1.2 mg/dL 0.7  Alkaline Phos 38 - 126 U/L 176(H)  AST 15 - 41 U/L 18  ALT 0 - 44 U/L 11   Edinburgh Score: Edinburgh Postnatal Depression Scale Screening Tool 03/16/2021  I have been able to laugh and see the funny side of things. 1  I have looked forward with enjoyment to things. 0  I have blamed myself unnecessarily when things went wrong. 2  I have been anxious or worried for no good reason. 2  I have felt scared or panicky for no good reason. 3  Things have been getting on top of me. 2  I have been so unhappy that I have had difficulty sleeping. 2  I have felt sad or miserable. 2  I have been so unhappy that I have been crying. 1  The thought of harming myself has occurred to me. 0  Edinburgh Postnatal Depression Scale Total 15     After visit meds:  Allergies as of 03/19/2021   No Known Allergies      Medication List     TAKE these medications    acetaminophen 500 MG tablet Commonly known as: TYLENOL Take 2 tablets (1,000 mg total) by mouth every 6 (six) hours.   Blood Pressure Monitor Automat Devi 1 Device by Does not apply route daily. Automatic blood pressure cuff regular size. To monitor blood pressure regularly at home. ICD-10 code: O09.90   McGill 1 Units by Does not apply route daily.   docusate sodium 100 MG capsule Commonly known as: COLACE Take 1 capsule (100 mg total) by  mouth 2 (two) times daily as needed.   ferrous sulfate 325 (65 FE) MG tablet Commonly known as: FerrouSul Take 1 tablet (325 mg total) by mouth every other day.   folic acid 1 MG tablet Commonly known as: FOLVITE Take 1 tablet (1 mg total) by mouth daily.   Gojji Weight Scale Misc 1 Device by Does not apply route daily as needed. To weight self daily as needed at home.  ICD-10 code: O09.90   ibuprofen 600 MG tablet Commonly known as: ADVIL Take 1 tablet (600 mg total) by mouth every 6 (six) hours as needed for mild pain or moderate pain.   metroNIDAZOLE 500 MG tablet Commonly known as: FLAGYL Take 1 tablet (500 mg total) by mouth 2 (two) times daily.   oxyCODONE 5 MG immediate release tablet Commonly known as: Oxy IR/ROXICODONE Take 1 tablet (5 mg total) by mouth every 4 (four) hours as needed for severe pain.   propranolol 10 MG tablet Commonly known as: INDERAL Take 1 tablet (10 mg total) by mouth 2 (two) times daily as needed. Take and bedtime and as needed   simethicone 80 MG chewable tablet Commonly known as: MYLICON Chew 1 tablet (80 mg total) by mouth 3 (three) times daily after meals.   Vitafol Ultra 29-0.6-0.4-200 MG Caps Take 1 tablet by mouth daily.         Discharge home in stable condition Infant Feeding: Bottle and Breast Infant Disposition:home with mother Discharge instruction: per After Visit Summary and Postpartum booklet. Activity: Advance as tolerated. Pelvic rest for 6 weeks.  Diet: routine diet Future Appointments: Future Appointments  Date Time Provider [place]  03/26/2021  4:10 PM Griffin Basil, MD [facility] None  04/06/2021 11:30 AM Tobb, Godfrey Pick, DO [facility]  04/14/2021 10:55 AM Constant, Vickii Chafe, MD [facility] None   Follow up Visit: Message sent to [facility] by Dr. Cy Blamer on 10/31  Please schedule this patient for a In person postpartum visit in 4 weeks with the following provider: MD. Additional Postpartum F/U:Incision check  1 week  High risk pregnancy complicated by:  twin gestation Delivery mode:     Julyanna, Scholle [[id number]]  Vaginal, Spontaneous    Sanaiyah, Kirchhoff [[id number]]  C-Section, Low Transverse  Anticipated Birth Control:  Plans Interval BTL    03/19/2021 Myrtis Ser, CNM 9:55 AM

## 2021-03-16 NOTE — Anesthesia Procedure Notes (Signed)
Epidural Patient location during procedure: OB Start time: 03/15/2021 10:33 PM End time: 03/15/2021 10:37 PM  Staffing Anesthesiologist: Kaylyn Layer, MD Performed: anesthesiologist   Preanesthetic Checklist Completed: patient identified, IV checked, risks and benefits discussed, monitors and equipment checked, pre-op evaluation and timeout performed  Epidural Patient position: sitting Prep: DuraPrep and site prepped and draped Patient monitoring: continuous pulse ox, blood pressure and heart rate Approach: midline Location: L3-L4 Injection technique: LOR air  Needle:  Needle type: Tuohy  Needle gauge: 17 G Needle length: 9 cm Needle insertion depth: 6 cm Catheter type: closed end flexible Catheter size: 19 Gauge Catheter at skin depth: 11 cm Test dose: negative and Other (1% lidocaine)  Assessment Events: blood not aspirated, injection not painful, no injection resistance, no paresthesia and negative IV test  Additional Notes Patient identified. Risks, benefits, and alternatives discussed with patient including but not limited to bleeding, infection, nerve damage, paralysis, failed block, incomplete pain control, headache, blood pressure changes, nausea, vomiting, reactions to medication, itching, and postpartum back pain. Confirmed with bedside nurse the patient's most recent platelet count. Confirmed with patient that they are not currently taking any anticoagulation, have any bleeding history, or any family history of bleeding disorders. Patient expressed understanding and wished to proceed. All questions were answered. Sterile technique was used throughout the entire procedure. Please see nursing notes for vital signs.   Crisp LOR on first pass. Test dose was given through epidural catheter and negative prior to continuing to dose epidural or start infusion. Warning signs of high block given to the patient including shortness of breath, tingling/numbness in hands,  complete motor block, or any concerning symptoms with instructions to call for help. Patient was given instructions on fall risk and not to get out of bed. All questions and concerns addressed with instructions to call with any issues or inadequate analgesia.  Reason for block:procedure for pain

## 2021-03-16 NOTE — Progress Notes (Signed)
Patient Vitals for the past 4 hrs:  BP Temp Temp src Pulse Resp SpO2  03/16/21 2247 (!) 95/55 98.2 F (36.8 C) Oral 100 18 99 %   Urine output 80cc in 4 hours, urine dark in color.  Will give IVF bolus.  Also, will hold beta blocker d/t hypotension.

## 2021-03-16 NOTE — Progress Notes (Signed)
Called to bedside due to 3-min prolonged deceleration of Twin A that resolved with position changes into left lateral. Rechecked cervix about 30 minutes prior and ~6/90. Started IV LR bolus. After being positioned into left lateral, Twin B did not tolerate and then was placed into high fowlers. Reassuring FHT for both returned. Baseline 145/mod/none for Twin A and B. Dr. Vergie Living made aware of FHT. Will continue to monitor closely.   Plan per day team to recheck cervix soon and place an IUPC, sooner if needed pending FHT.   Allayne Stack, DO

## 2021-03-16 NOTE — Transfer of Care (Signed)
Immediate Anesthesia Transfer of Care Note  Patient: Andrea Logan  Procedure(s) Performed: CESAREAN SECTION  Patient Location: PACU  Anesthesia Type:Epidural  Level of Consciousness: awake  Airway & Oxygen Therapy: Patient Spontanous Breathing  Post-op Assessment: Report given to RN  Post vital signs: Reviewed and stable  Last Vitals:  Vitals Value Taken Time  BP    Temp    Pulse    Resp    SpO2      Last Pain:  Vitals:   03/16/21 0801  TempSrc: Oral  PainSc:       Patients Stated Pain Goal: 0 (03/15/21 1922)  Complications: No notable events documented.

## 2021-03-16 NOTE — Lactation Note (Signed)
This note was copied from a baby's chart. Lactation Consultation Note  Patient Name: Andrea Logan [id number] Date: 03/16/2021 Reason for consult: Early term 37-38.6wks;Infant < 6lbs;Initial assessment ([name] was vaginal delivery whereas Baby B was  C/S delivery. LC entered room Baby B recently returned from [place] due low temp's  and Baby B is less than 5 lbs.) Age:35 hours  [name] LC entered the room, [name] was asleep in the basinet and not cuing to breastfeed, per mom, infant was given 5 mls of formula at 1930 pm. Mom has not latched twins due to  not taking  out her nipple rings, Mom has now removed them. Mom plans to latch [name] only for now.  Baby B Born by C/S, less than 5 lbs, currently a poor feeder, has not had any volume of breast milk or formula at 12 hours of life. Per mom, they made attempts to offer formula but infant is refusing to eat, LC assist with attempting to feed infant, infant was doing a lot of tongue thrusting,  facial stress cuing , has disorganized suck, would not suckle on LC gloved finger and behavior is more of LPTI. LC suggested RN put in SPL consult for infant. LC assisted mom with hand expression and infant was given 5 mls of colostrum by spoon and finger feeding. Infant took 1 ml of formula with white Nfant nipple , tongue thrusting, disorganized suck with milk leaking out side of mouth Mom will continue to work towards offering infant her EBM that is hand expressed or pumped and  then formula. Mom knows to follow LPTI feeding policy, 8 to 12+ times within 24 hours, skin to skin, limit total feedings 30 minutes or less to reserve the energy. Mom will not latch Baby B at this time.  Mom's Feeding plan: 1-Mom understands twins feeding plans may change based on infant's feeding behaviors. 2-Mom will follow LPTI feeding policy ( green sheet given). 3-Mom will pump every 3 hours for 15 minutes on initial setting. 4- Mom will latch [name] at breast  only with for now and ask for latch assistance from RN/LC if needed  and supplement infant afterwards with EBM/Formula.   Maternal Data Has patient been taught Hand Expression?: Yes Does the patient have breastfeeding experience prior to this delivery?: Yes How long did the patient breastfeed?: Per mom, she breastfeed her 1st child for 1 year, 2nd and 3rd child both for 8 months, her 3rd child is currently 65 years old.  Feeding Mother's Current Feeding Choice: Breast Milk and Formula  LATCH Score                    Lactation Tools Discussed/Used Tools: Pump Breast pump type: Double-Electric Breast Pump Pump Education: Setup, frequency, and cleaning;Milk Storage Reason for Pumping: Multiple gestation, ETI infant , Baby B less than 5 lbs, C/S delivry and neither infant has latched at 11 hours of life. Pumping frequency: Mom knows to pump evrery 3 hours for 15 minutes on inital setting.  Interventions Interventions: Breast feeding basics reviewed;Skin to skin;Hand express;DEBP;Hand pump;Education;Pace feeding;LC Services brochure  Discharge Pump: DEBP;Personal WIC Program: No  Consult Status Consult Status: Follow-up Date: 03/17/21 Follow-up type: In-patient    Danelle Earthly 03/16/2021, 9:46 PM

## 2021-03-16 NOTE — Progress Notes (Addendum)
Labor Progress Note Andrea Logan is a 35 y.o. [id number] at [redacted]w[redacted]d admitted for SROM at 1700 on 10/30 and contractions.  S: Resting comfortably in bed. Epidural in place.  O:  BP 90/64   Pulse (!) 185   Temp 98.7 F (37.1 C) (Oral)   Resp 16   Ht 5\' 9"  (1.753 m)   Wt 115.3 kg   LMP 06/28/2020 (Exact Date)   SpO2 100%   BMI 37.55 kg/m  Fetus A: EFM: Baseline 150/ moderate variability /accelerations, no decelerations Fetus B: EFM: Baseline 150 / moderate variability / accelerations, one late deceleration  CVE: Dilation: 3 Effacement (%): 80 Station: -1 Presentation: Vertex (A: vertex and B: transverse) Exam by:: Dr. [email or web address]   A&P: 35 y.o. 31 [redacted]w[redacted]d admitted for SROM at 1700 on 10/30 and contractions. #Labor: Progressing well. She is contracting regularly on tocometer and making cervical change, so we will hold off on augmentation for now. #Pain:  #FWB: Fetus A at Category I, Fetus B at Category II -- responding to positional changes #GBS positive: on PCN #Hx heart palpitations: followed by [facility] cardiology. Continuing inderal 10 mg qhs.  [facility], MD 3:09 AM

## 2021-03-16 NOTE — Op Note (Addendum)
Cesarean section Op Note   Patient: Andrea Logan  Date of Procedure: 03/15/2021 - 03/16/2021  Procedure: High transverse Cesarean Section   Indications: malpresentation: footling presentation of Twin B, fetal bradycardia  Pre-operative Diagnosis: Cesarean Section-fetal heart rate.   Post-operative Diagnosis: Same  TOLAC Candidate: No (due to high transverse incision)  Surgeon: Surgeon(s) and Role:    * Venora Maples, MD - Primary  Assistants: Myna Hidalgo and Warner Mccreedy  An experienced assistant was required given the standard of surgical care given the complexity of the case.  This assistant was needed for exposure, dissection, suctioning, retraction, instrument exchange, assisting with delivery with administration of fundal pressure, and for overall help during the procedure.   Anesthesia: epidural  Anesthesiologist: Elmer Picker, MD   Estimated Blood Loss: 1331 ml   Total IV Fluids: 1750 ml  Urine Output: 600 cc OF tea colored urine  Specimens: placenta x2    Complications: OB Hemorrhage   Disposition: PACU - hemodynamically stable.  Patient was moved in expidited fashion from labor room to OR due to fetal bradycardia and malpresentation of Twin B. See delivery details of Twin A in separate delivery note.  A Time Out was held and the above information confirmed.  PROCEDURE IN DETAIL:  The patient was urgently taken to the the operating room her epidural anesthesia was dosed up to surgical level and was found to be adequate. She was then placed in a dorsal supine position with a leftward tilt, prepped quickly with betadine and draped in a sterile manner.  She already had a foley catheter in her bladder from L&D.  After a timeout was performed, a Pfannenstiel skin incision was made with scalpel and carried through to the underlying layer of fascia. The fascial incision was extended bilaterally in a blunt fashion.  The fascia was separated from underlying rectus  muscles bluntly.  The rectus muscles were separated in the midline bluntly and the peritoneum was entered bluntly. The lower portion of the uterus was bulging and it was not immediately apparent if this was bladder, so a high transverse uterine segment hysterotomy was made with a scalpel and extended bilaterally bluntly.  The infant was successfully delivered from footling breech presentation, the cord was clamped and cut and the infant was handed over to awaiting neonatology team. Arterial cord gas was obtained. Incision to delivery time was less than one minute.  The placenta was delivered intact and had a three-vessel cord. As twin A umbilical cord had been clamped and was still by the perineum, that cord was ligated and cut to avoid bringing the instrument up through the vagina and out through the hysterotomy, it was retrieved after the case was completed. The uterus was then exteriorized and cleared of clot and debris. Due to the high transverse incision the uterus was closed in three layers. The hysterotomy was first closed with 0 Vicryl in a running locked fashion, and an imbricating layer was also placed with 0 Vicryl. A third layer was then placed with 0 Monocryl. The uterus was returned to the abdomen and the pelvis was cleared of all clot and debris and irrigation was performed. Due to significant blood loss during surgery and poor tone of the uterus she ended receiving 1.5 bags of pitocin as well as one dose of IM methergine and 1g of TXA during the case. Hemostasis was then confirmed on all surfaces, and we began to close the peritoneum however we subsequently had welling up of blood. The  uterus was re-exteriorized and a thorough inspection was made of all surfaces. There was a Reitter area of serosal bleeding on the anterior surface of the uterus that was cauterized with bovie but otherwise inspection revealed no significant source of bleeding. The uterus was returned to the abdomen and Arista was placed  on the anterior surface of the uterus as well as the hysterotomy. The peritoneum was then closed with 2-0 Vicryl. The fascia was then closed using 0 Vicryl in a running fashion. Due to the expedited nature of the start of the case the initial count was not done. An Xray was done after closure of fascia and there were no instruments or sponges noted. The subcutaneous layer was irrigated and closed with 2-0 plain gut. The skin was closed with a 4-0 Vicryl subcuticular stitch. The patient tolerated the procedure well. She was taken to the recovery room in stable condition.    Signed: Warner Mccreedy, MD, MPH OB Fellow, [facility]   Attestation of Attending Supervision of Obstetric Fellow During Surgery: An experienced assistant was required given the standard of surgical care given the complexity of the case.  This assistant was needed for exposure, dissection, suctioning, retraction, instrument exchange, assisting with delivery with administration of fundal pressure, and for overall help during the procedure. Surgery was performed with the Obstetric Fellow under my supervision and collaboration.  I was present and scrubbed for the entire procedure.   I have reviewed the Obstetric Fellow's operative report, and I agree with the documentation. I have also made any necessary editorial changes.  Venora Maples, MD/MPH Attending Family Medicine Physician, [facility] for [facility], [facility]

## 2021-03-17 LAB — CBC
HCT: 23.9 % — ABNORMAL LOW (ref 36.0–46.0)
Hemoglobin: 7.8 g/dL — ABNORMAL LOW (ref 12.0–15.0)
MCH: 28.3 pg (ref 26.0–34.0)
MCHC: 32.6 g/dL (ref 30.0–36.0)
MCV: 86.6 fL (ref 80.0–100.0)
Platelets: 132 10*3/uL — ABNORMAL LOW (ref 150–400)
RBC: 2.76 MIL/uL — ABNORMAL LOW (ref 3.87–5.11)
RDW: 17.2 % — ABNORMAL HIGH (ref 11.5–15.5)
WBC: 22.6 10*3/uL — ABNORMAL HIGH (ref 4.0–10.5)
nRBC: 0.1 % (ref 0.0–0.2)

## 2021-03-17 LAB — PREPARE RBC (CROSSMATCH)

## 2021-03-17 MED ORDER — SODIUM CHLORIDE 0.9% IV SOLUTION
Freq: Once | INTRAVENOUS | Status: AC
Start: 2021-03-17 — End: 2021-03-17

## 2021-03-17 MED ORDER — SODIUM CHLORIDE 0.9 % IV SOLN
500.0000 mg | Freq: Once | INTRAVENOUS | Status: AC
  Administered 2021-03-17: 500 mg via INTRAVENOUS
  Filled 2021-03-17: qty 25

## 2021-03-17 NOTE — Lactation Note (Signed)
This note was copied from a baby's chart. Lactation Consultation Note  Patient Name: Andrea Logan [id number]'X Date: 03/17/2021 Reason for consult: Follow-up assessment;Multiple gestation;Early term 37-38.6wks;Infant < 6lbs Age:35 hours  LC in to room for follow up. Twins are 7 hours old and < 6 lbs. Mother states they both took ~20 mL of formula. Mother is inspecting her personal pump (Mom Cozy wireless). Mother reports DEBP does not seem to be suctioning left side. LC noted yellow piece and white membrane missing. Reviewed initiation setting. Encouraged to use DEBP after each feeding or every 3h. Reviewed milk storage and proper care of parts.   Discussed LPTI guidelines due to infant's current behavior and weight.   GirlB: started cueing. LC bottlefed ~4 mL, side-lying and pacing. Observed spilling of formula. Slow flow nipple (yellow) seems to be too fast.  BoyA: is sleeping during visit.   Plan: 1-Feed (breast and/or formula) no longer than 30 minutes to preserve energy 2-Pump using initiation setting or hand express to supplement 3-Spoon or fingerfeed EBM  4-Pacing and side-lying position when bottle-feeding formula. 5-Contact LC for support   All questions answered at this time. Mother is able to teach back feeding plan.    Maternal Data Has patient been taught Hand Expression?: Yes  Feeding Mother's Current Feeding Choice: Breast Milk and Formula Nipple Type: Slow - flow  Lactation Tools Discussed/Used Tools: Pump;Flanges;Coconut oil;Bottle Flange Size: 24 Breast pump type: Double-Electric Breast Pump;Manual Pump Education: Setup, frequency, and cleaning;Milk Storage Reason for Pumping: multiple gestation, LPTI Pumping frequency: Q3 Pumped volume:  (currently pumping)  Interventions Interventions: Hand express;Breast massage;Expressed milk;Hand pump;DEBP;Pace feeding;Education;Breast feeding basics reviewed  Discharge Pump: DEBP;Manual;Personal  Consult  Status Consult Status: Follow-up Date: 03/18/21 Follow-up type: In-patient    Andrea Logan 03/17/2021, 11:01 PM

## 2021-03-17 NOTE — Progress Notes (Signed)
Post-Op Day 1, primary CS for NRFHT of Twin B  Subjective: Feeling weak and dizzy when standing. Foley just came out.  tolerating PO, ,Malstrom lochia,.plans to breastfeed, plans to bottle feed,  plans inpt nexplanon  Objective: Blood pressure (!) 93/55, pulse 97, temperature 98.5 F (36.9 C), temperature source Oral, resp. rate 16, height 5\' 9"  (1.753 m), weight 115.3 kg, last menstrual period 06/28/2020, SpO2 100 %, unknown if currently breastfeeding.  Physical Exam:  General: alert, cooperative and no distress Lochia:normal flow Chest: CTAB Heart: RRR no m/r/g Abdomen: +BS, soft, nontender, dsg c/d/intact, no erythema Uterine Fundus: firm DVT Evaluation: No evidence of DVT seen on physical exam. Extremities: trace edema   Discussed blood transfusion w/Venofer later.  Pt agreeable Recent Labs    03/16/21 1503 03/17/21 0524  HGB 10.1* 7.8*  HCT 32.1* 23.9*    Assessment/Plan: 1 U PRBC Venofer   LOS: 2 days   13/01/22 03/17/2021, 7:56 AM

## 2021-03-17 NOTE — Addendum Note (Signed)
Addendum  created 03/17/21 1005 by Elmer Picker, MD   Intraprocedure Staff edited

## 2021-03-18 LAB — CBC WITH DIFFERENTIAL/PLATELET
Abs Immature Granulocytes: 0.17 10*3/uL — ABNORMAL HIGH (ref 0.00–0.07)
Basophils Absolute: 0 10*3/uL (ref 0.0–0.1)
Basophils Relative: 0 %
Eosinophils Absolute: 0.1 10*3/uL (ref 0.0–0.5)
Eosinophils Relative: 1 %
HCT: 22.8 % — ABNORMAL LOW (ref 36.0–46.0)
Hemoglobin: 7.2 g/dL — ABNORMAL LOW (ref 12.0–15.0)
Immature Granulocytes: 1 %
Lymphocytes Relative: 15 %
Lymphs Abs: 1.9 10*3/uL (ref 0.7–4.0)
MCH: 27.7 pg (ref 26.0–34.0)
MCHC: 31.6 g/dL (ref 30.0–36.0)
MCV: 87.7 fL (ref 80.0–100.0)
Monocytes Absolute: 0.9 10*3/uL (ref 0.1–1.0)
Monocytes Relative: 7 %
Neutro Abs: 9.6 10*3/uL — ABNORMAL HIGH (ref 1.7–7.7)
Neutrophils Relative %: 76 %
Platelets: 139 10*3/uL — ABNORMAL LOW (ref 150–400)
RBC: 2.6 MIL/uL — ABNORMAL LOW (ref 3.87–5.11)
RDW: 17.5 % — ABNORMAL HIGH (ref 11.5–15.5)
WBC: 12.7 10*3/uL — ABNORMAL HIGH (ref 4.0–10.5)
nRBC: 0.5 % — ABNORMAL HIGH (ref 0.0–0.2)

## 2021-03-18 LAB — SURGICAL PATHOLOGY

## 2021-03-18 LAB — PREPARE RBC (CROSSMATCH)

## 2021-03-18 MED ORDER — POLYETHYLENE GLYCOL 3350 17 G PO PACK
17.0000 g | PACK | Freq: Two times a day (BID) | ORAL | Status: DC
  Administered 2021-03-19 (×2): 17 g via ORAL
  Filled 2021-03-18 (×2): qty 1

## 2021-03-18 MED ORDER — DOCUSATE SODIUM 100 MG PO CAPS: 100.0000 mg | ORAL_CAPSULE | Freq: Every day | ORAL | Status: DC | PRN

## 2021-03-18 MED ORDER — SODIUM CHLORIDE 0.9% IV SOLUTION: Freq: Once | INTRAVENOUS | Status: AC

## 2021-03-18 NOTE — Progress Notes (Addendum)
Post Partum Day 2 s/p SVD AND Post Partum Day 2 s/p PHTCS for delivery of twin B for NRFHT Subjective: up ad lib, voiding, tolerating PO, and + flatus. Having incisional pain this morning. Still lightheaded if standing up too quickly but somewhat better than yesterday.  Objective: Blood pressure 101/60, pulse 92, temperature 98.5 F (36.9 C), temperature source Oral, resp. rate 17, height 5\' 9"  (1.753 m), weight 115.3 kg, last menstrual period 06/28/2020, SpO2 100 %, unknown if currently breastfeeding.  Physical Exam:  General: alert and cooperative Lochia: appropriate Uterine Fundus: firm Incision: no significant drainage, no dehiscence DVT Evaluation: No evidence of DVT seen on physical exam. Negative Homan's sign. No cords or calf tenderness. No significant calf/ankle edema.  Recent Labs    03/16/21 1503 03/17/21 0524  HGB 10.1* 7.8*  HCT 32.1* 23.9*    Assessment/Plan: #MOF: Working on breastfeeding with lactation and supplementing with formula #MOC: desires BTL but not during this hospitalization. Plans to discuss at followup postpartum appointment. Intends abstinence until BTL. #Anemia: Hgb nadir at 7.8; received 1 unit PRBC and venofer yesterday. Hgb this morning still pending. Improved symptoms but still somewhat symptomatic  Anticipate discharge tomorrow   LOS: 3 days   13/01/22 03/18/2021, 7:14 AM   Attestation of Supervision of Student:  I confirm that I have verified the information documented in the  resident  student's note and that I have also personally reperformed the history, physical exam and all medical decision making activities.  I have verified that all services and findings are accurately documented in this student's note; and I agree with management and plan as outlined in the documentation. I have also made any necessary editorial changes.   13/06/2020, CNM [facility], [facility] 03/18/2021 10:00 AM

## 2021-03-18 NOTE — Progress Notes (Signed)
CSW received consult due to score 15 on Edinburgh Depression Screen.    CSW met with MOB at bedside to complete assessment. CSW introduced self and explained role. MOB was welcoming, open, and became tearful during assessment. CSW and MOB discussed edinburgh score 15. MOB attributed her high score to being in pain, being tired, being annoyed and needing help. CSW assisted MOB in processing her emotions associated with the past seven days and feelings associated with traumatic birth experience. CSW inquired about MOB's support system, MOB reported no supports. MOB shared about infants FOB being unreliable and not a support. CSW inquired about who was assisting with MOB's children at home, MOB reported that her sister came into town from [place] to help. CSW positively affirmed the support MOB is receiving from her sister. MOB acknowledged being grateful for her sister coming to help her. MOB shared about life stressors and financial stressors. CSW actively listened and provided encouraging words. CSW agreed to provide MOB with financial assistance resources, MOB agreeable. CSW inquired about MOB's mental health history, MOB denied any mental health history. 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 MOB described her postpartum depression as being sad and isolating. MOB shared that she did not take any medication nor participate in therapy to treat her postpartum depression. MOB reported that the symptoms did not subside and that she feels it may have turned into general depression. CSW inquired about MOB's interest in therapy and or medication. MOB reported that she is not interested in medication but is open to therapy resources. CSW inquired in MOB's interest in Child First Program that can assist with community resources and in home therapy, MOB reported that she is interested. CSW agreed to make referral. CSW inquired about how  MOB was feeling emotionally after giving birth, MOB reported that she is feeling every emotion (exhausted, annoyed, angry). CSW acknowledged and validated MOB's feelings. MOB presented calm and was open when sharing with CSW about feelings and stressors. MOB did not demonstrate any acute mental health signs/symptoms. CSW assessed for safety, MOB denied SI, HI, and domestic violence. CSW informed MOB that she may be more susceptible to postpartum depression due to her mental health history.  CSW provided education regarding Baby Blues vs PMADs and provided MOB with resources for mental health follow up.  CSW encouraged MOB to evaluate her mental health throughout the postpartum period with the use of the New Mom Checklist developed by [facility]partum [facility] as well as the Edinburgh Postnatal Depression Scale and notify a medical professional if symptoms arise.     CSW provided review of Sudden Infant Death Syndrome (SIDS) precautions. MOB verbalized understanding and reported that she has a twin basinet. MOB reported that she also has 2 car seats. MOB reported that she has been getting items for infants but still needs items. CSW asked if MOB was interested in some donated items for infants, MOB reported yes. CSW agreed to return with items.   CSW returned and provided donated items for infants. MOB was very appreciative and thanked CSW.   CSW made referral to child first program and provided financial assistance resources. MOB thanked CSW and denied any additional needs.   CSW identifies no further need for intervention and no barriers to discharge at this time.  Chavon Lucarelli, LCSW Clinical Social Worker [facility] Cell#: [phone] 

## 2021-03-18 NOTE — Lactation Note (Signed)
This note was copied from a baby's chart. Lactation Consultation Note  Patient Name: Andrea Logan Andrea Logan Date: 03/18/2021 Reason for consult: Follow-up assessment;Mother's request;Early term 37-38.6wks;Infant < 6lbs;Infant weight loss;Breastfeeding assistance;Multiple gestation Age:35 hours LC assisted with latching infant B to breast with signs of milk transfer with breast compression. LC reviewed LPTI guidelines to reduce calorie loss including keeping total feeding under 30 min.   Mom currently using 24 flanges and denied any pain with use. Mom denied pain with latch.  Plan 1. To feed based on cues 8-12x 24hr period. Mom to offer breasts and look for signs of milk transfer.  2. Mom to supplement with EBM first followed by formula with Dr. Manson Logan preemine nipple ( 20 ml or more) to increase as tolerated.  3. DEBP q 3hrs for   Mom able to latch [name] on her own in football and will supplement with Similac 20 cal Andrea Logan with Dr. Manson Logan preemie nipple.   All questions answered at the end of the visit.    Maternal Data    Feeding Mother's Current Feeding Choice: Breast Milk and Formula Nipple Type: Dr. Lorne Logan  LATCH Score Latch: Repeated attempts needed to sustain latch, nipple held in mouth throughout feeding, stimulation needed to elicit sucking reflex.  Audible Swallowing: Spontaneous and intermittent  Type of Nipple: Everted at rest and after stimulation  Comfort (Breast/Nipple): Soft / non-tender  Hold (Positioning): Assistance needed to correctly position infant at breast and maintain latch.  LATCH Score: 8   Lactation Tools Discussed/Used Tools: Pump;Flanges Flange Size: 24 Breast pump type: Double-Electric Breast Pump Pump Education: Setup, frequency, and cleaning;Milk Storage Reason for Pumping: increase stimulation Pumping frequency: every 3 hrs for  Interventions Interventions: Breast feeding basics reviewed;Education;Support  pillows;Assisted with latch;Position options;Pace feeding;Skin to skin;Expressed milk;Breast massage;Infant Driven Feeding Algorithm education;Breast compression;DEBP;Adjust position  Discharge    Consult Status Consult Status: Follow-up Date: 03/19/21 Follow-up type: In-patient    Andrea Logan  Andrea Logan 03/18/2021, 4:00 PM

## 2021-03-18 NOTE — Lactation Note (Signed)
This note was copied from a baby's chart. Lactation Consultation Note  Patient Name: Andrea Logan Today's Date: 03/18/2021   Age:35 hours  LC went in to see how feedings were going. Mom in a deep sleep. LC talked to RN, Glenna Durand, asked her to call me for next feeding around 3 pm.   Maternal Data    Feeding Nipple Type: Dr. Cline Crock  [facility] Score                    Lactation Tools Discussed/Used    Interventions    Discharge    Consult Status      Andrea Acocella  Logan 03/18/2021, 2:01 PM

## 2021-03-19 ENCOUNTER — Other Ambulatory Visit: Payer: Medicaid Other

## 2021-03-19 DIAGNOSIS — D62 Acute posthemorrhagic anemia: Secondary | ICD-10-CM | POA: Diagnosis not present

## 2021-03-19 LAB — TYPE AND SCREEN
ABO/RH(D): A POS
Antibody Screen: NEGATIVE
Unit division: 0
Unit division: 0

## 2021-03-19 LAB — BPAM RBC
Blood Product Expiration Date: 202211052359
Blood Product Expiration Date: 202211192359
ISSUE DATE / TIME: 202211010908
ISSUE DATE / TIME: 202211021737
Unit Type and Rh: 6200
Unit Type and Rh: 6200

## 2021-03-19 LAB — HEMOGLOBIN AND HEMATOCRIT, BLOOD
HCT: 27.6 % — ABNORMAL LOW (ref 36.0–46.0)
Hemoglobin: 8.7 g/dL — ABNORMAL LOW (ref 12.0–15.0)

## 2021-03-19 MED ORDER — ACETAMINOPHEN 500 MG PO TABS: 1000.0000 mg | ORAL_TABLET | Freq: Four times a day (QID) | ORAL | 0 refills | Status: DC

## 2021-03-19 MED ORDER — OXYCODONE HCL 5 MG PO TABS: 5.0000 mg | ORAL_TABLET | ORAL | 0 refills | Status: DC | PRN

## 2021-03-19 MED ORDER — SIMETHICONE 80 MG PO CHEW: 80.0000 mg | CHEWABLE_TABLET | Freq: Three times a day (TID) | ORAL | 0 refills | Status: DC

## 2021-03-19 MED ORDER — IBUPROFEN 600 MG PO TABS: 600.0000 mg | ORAL_TABLET | Freq: Four times a day (QID) | ORAL | 0 refills | Status: DC | PRN

## 2021-03-19 NOTE — Lactation Note (Addendum)
This note was copied from a baby's chart. Lactation Consultation Note  Patient Name: Andrea Logan Today's Date: 03/19/2021 Reason for consult: Follow-up assessment;Multiple gestation;Infant < 6lbs;Early term 37-38.6wks Age:35 hours  [name] at 5% weight loss Baby B at 3% weight loss  LC in to visit with P4 Mom of ET twin babies being discharged.  Both babies are primarily being bottle fed with an occasional latch to the breast where it has been reported that they both latch well.   Met with Mom on her way out the door.  Mom states she has a DEBP and knows she should be using it regularly.  Breasts are filling.  Engorgement prevention and treatment reviewed.    Offered to send an OP lactation message for f/u but Mom declined.  Encouraged Mom to call prn for concerns.  Mom denies any questions at this time.  Lactation Tools Discussed/Used Tools: Pump;Bottle  Interventions Interventions: DEBP;Hand pump  Discharge Discharge Education: Outpatient recommendation  Consult Status Consult Status: Complete (mother declined follow up) Date: 03/19/21 Follow-up type: Call as needed    Broadus John 03/19/2021, 2:17 PM

## 2021-03-20 ENCOUNTER — Telehealth: Payer: Self-pay

## 2021-03-20 NOTE — Telephone Encounter (Signed)
Transition Care Management Unsuccessful Follow-up Telephone Call  Date of discharge and from where:  03/19/2021 from [facility]  Attempts:  1st Attempt  Reason for unsuccessful TCM follow-up call:  Left voice message

## 2021-03-21 ENCOUNTER — Inpatient Hospital Stay (HOSPITAL_COMMUNITY)
Admission: AD | Admit: 2021-03-21 | Payer: Medicaid Other | Source: Home / Self Care | Admitting: Obstetrics and Gynecology

## 2021-03-21 ENCOUNTER — Inpatient Hospital Stay (HOSPITAL_COMMUNITY): Payer: Medicaid Other

## 2021-03-23 ENCOUNTER — Telehealth: Payer: Self-pay | Admitting: *Deleted

## 2021-03-23 NOTE — Telephone Encounter (Signed)
Returned TC to patient reporting extreme leg swelling and inability to move around well. Patient is 1 week status post emergency CS for twins and postpartum hemorrhage requiring transfusion. Advised patient to seek care in MAU so that in person assessment, BP and possible labs can be completed. Patient expressed difficulty with obtaining care for newborn twins and other children. Encouraged her to reach out to friends, neighbors, family members for childcare assistance. Patient verbalized understanding and will try to secure childcare and will go into be evaluated in MAU.

## 2021-03-23 NOTE — Telephone Encounter (Signed)
Transition Care Management Follow-up Telephone Call Date of discharge and from where: 03/19/2021 from [facility] How have you been since you were released from the [facility]? Pt stated that she is feeling okay. She is concerned about the leg swelling. Pt stated that it is very bad and affecting the way that she walks. Pt also mention weakness that is making it difficult to walk as well. I advised pt to reach out to OBGYN office for additional recommendations. Pt agreed to call office.  Any questions or concerns? No  Items Reviewed: Did the pt receive and understand the discharge instructions provided? Yes  Medications obtained and verified? Yes  Other? No  Any new allergies since your discharge? No  Dietary orders reviewed? No Do you have support at home? Yes   Functional Questionnaire: (I = Independent and D = Dependent) ADLs: I  Bathing/Dressing- I  Meal Prep- I  Eating- I  Maintaining continence- I  Transferring/Ambulation- I - but having difficulty walking due to weakness and swelling.   Managing Meds- I   Follow up appointments reviewed:  PCP [facility] f/u appt confirmed? No   Specialist [facility] f/u appt confirmed? Yes  Scheduled to see Mariel Aloe, MD on 03/26/2021 @ 4:10pm. Are transportation arrangements needed? No  If their condition worsens, is the pt aware to call PCP or go to the Emergency Dept.? Yes Was the patient provided with contact information for the PCP's office or ED? Yes Was to pt encouraged to call back with questions or concerns? Yes

## 2021-03-26 ENCOUNTER — Ambulatory Visit: Payer: Medicaid Other | Admitting: Obstetrics and Gynecology

## 2021-03-30 ENCOUNTER — Telehealth (HOSPITAL_COMMUNITY): Payer: Self-pay

## 2021-03-30 DIAGNOSIS — Z1331 Encounter for screening for depression: Secondary | ICD-10-CM

## 2021-03-30 NOTE — Telephone Encounter (Signed)
"  When I got home from the hospital I started to have really bad swelling in my feet. I called my doctor, it was bad. I couldn't come to the Maternity because I don't have childcare for my kids. The swelling is better now. I just elevated my feet a lot. Health wise I'm doing ok. I'm just taking it easy. I have an appointment with my OB-GYN tomorrow." RN encouraged patient to keep her appointment with her doctor tomorrow. Patient has no questions or concerns about her healing.  "I'm breasfeeding and giving bottles. I missed there first doctor's appointment because my swelling was so painful in my legs. They are feeding good. The girl falls asleep during feedings so I have to keep messing with her to keep her awake to finish her feeding. I'm feeding them every 3 hours." RN told patient to call pediatrician and let them know that the babies missed there 1st doctor's appointment and need to reschedule. RN emphasized the importance of the babies being seen for follow up from discharge from hospital for weight check & Jaundice check. "The boys cord hasn't fallen off yet but baby girls did." RN reviewed cord care. "They sleep in a bassinet."  RN reviewed ABC's of safe sleep with patient. Patient declines any questions or concerns about baby.  EPDS score is 13. Integrated behavioral health referral placed.   RN told patient about Maternal Mental Health Resources ([facility], National Maternal Mental Health Hotline, Postpartum [employer]). Also told patient about [facility] support group offerings. Will e-mail these resources to patient as well.  Marcelino Duster [facility] 03/30/2021,1916

## 2021-03-31 ENCOUNTER — Telehealth: Payer: Self-pay | Admitting: Clinical

## 2021-03-31 NOTE — Telephone Encounter (Signed)
Attempt to schedule, per referral; Left HIPPA-compliant message to call back Kodi Guerrera from [facility] at  [phone] (Parilee Hally's office); left MyChart message for pt. °

## 2021-04-01 ENCOUNTER — Ambulatory Visit: Payer: Medicaid Other | Admitting: Obstetrics and Gynecology

## 2021-04-01 ENCOUNTER — Telehealth: Payer: Self-pay | Admitting: *Deleted

## 2021-04-01 ENCOUNTER — Other Ambulatory Visit: Payer: Self-pay | Admitting: Obstetrics & Gynecology

## 2021-04-01 DIAGNOSIS — O0990 Supervision of high risk pregnancy, unspecified, unspecified trimester: Secondary | ICD-10-CM

## 2021-04-01 NOTE — Telephone Encounter (Signed)
TC to patient. No answer. Left VM. Patient was a no show for 11:15 appt today. Concerned about patient with history of stat C/S and postpartum hemorrhage requiring blood transfusion. Advised patient to call the office to speak with RN.

## 2021-04-06 ENCOUNTER — Ambulatory Visit: Payer: Medicaid Other | Admitting: Cardiology

## 2021-04-14 ENCOUNTER — Telehealth: Payer: Self-pay | Admitting: Obstetrics and Gynecology

## 2021-04-14 ENCOUNTER — Encounter: Payer: Self-pay | Admitting: Obstetrics and Gynecology

## 2021-04-14 ENCOUNTER — Telehealth (INDEPENDENT_AMBULATORY_CARE_PROVIDER_SITE_OTHER): Payer: Medicaid Other | Admitting: Obstetrics and Gynecology

## 2021-04-14 NOTE — Progress Notes (Signed)
Provider location: [facility] [place]   Patient location: Home  I connected with [facility] on 04/14/21 at 10:55 AM EST by Mychart Video Encounter and verified that I am speaking with the correct person using two identifiers.       I discussed the limitations, risks, security and privacy concerns of performing an evaluation and management service virtually and the availability of in person appointments. I also discussed with the patient that there may be a patient responsible charge related to this service. The patient expressed understanding and agreed to proceed.  Post Partum Visit Note Subjective:   Andrea [facility] is a 35 y.o. [id number] female who presents for a postpartum visit. She is 4 weeks postpartum following a vaginal delivery of [name] and cesarean section for baby B of twin pregnancy.  I have fully reviewed the prenatal and intrapartum course. The delivery was at 37.2 gestational weeks.  Anesthesia: epidural.. Postpartum course has been uncomplicated. Baby is doing well. Baby is feeding by both breast and bottle - Similac Advance. Bleeding no bleeding. Bowel function is abnormal: constipation . Bladder function is normal. Patient is not sexually active. Contraception method is abstinence. Pt is unsure about what method of BC she would like.    Postpartum depression screening: negative, score 2. .     The pregnancy intention screening data noted above was reviewed. Potential methods of contraception were discussed. The patient elected to proceed with No data recorded   Edinburgh Postnatal Depression Scale - 04/14/21 1138       Edinburgh Postnatal Depression Scale:  In the Past 7 Days   I have been able to laugh and see the funny side of things. 0    I have looked forward with enjoyment to things. 0    I have blamed myself unnecessarily when things went wrong. 1    I have been anxious or worried for no good reason. 0    I have felt scared or panicky for no  good reason. 0    Things have been getting on top of me. 1    I have been so unhappy that I have had difficulty sleeping. 0    I have felt sad or miserable. 0    I have been so unhappy that I have been crying. 0    The thought of harming myself has occurred to me. 0    Edinburgh Postnatal Depression Scale Total 2                Review of Systems Pertinent items noted in HPI and remainder of comprehensive ROS otherwise negative.  Objective:  There were no vitals taken for this visit.    General:  Alert, oriented and cooperative. Patient is in no acute distress.  Respiratory: Normal respiratory effort, no problems with respiration noted  Mental Status: Normal mood and affect. Normal behavior. Normal judgment and thought content.  Rest of physical exam deferred due to type of encounter   Assessment:    Normal postpartum exam.  Plan:  Essential components of care per ACOG recommendations:  1.  Mood and well being: Patient with negative depression screening today. Reviewed local resources for support.  - Patient does not use tobacco.  - hx of drug use? No    2. Infant care and feeding:  -Patient currently breastmilk feeding? Yes If breastmilk feeding discussed return to work and pumping. If needed, patient was provided letter for work to allow for every 2-3 hr  pumping breaks, and to be granted a private location to express breastmilk and refrigerated area to store breastmilk. Reviewed importance of draining breast regularly to support lactation. -Social determinants of health (SDOH) reviewed in EPIC. No concerns  3. Sexuality, contraception and birth spacing - Patient does not want a pregnancy in the next year.  Desired family size is 5 children.  - Reviewed forms of contraception in tiered fashion. Patient desired  Nexplanon  today.   - Discussed birth spacing of 18 months  4. Sleep and fatigue -Encouraged family/partner/community support of 4 hrs of uninterrupted sleep to  help with mood and fatigue  5. Physical Recovery  - Discussed patients delivery and complications - Patient had did not degree laceration, perineal healing reviewed. Patient expressed understanding - Patient has urinary incontinence? No  - Patient is safe to resume physical and sexual activity  6.  Health Maintenance - Last pap smear done 05/2019 and was normal with negative HPV.  7. Chronic Disease - PCP follow up  I provided 15 minutes of face-to-face time during this encounter.    No follow-ups on file.  No future appointments.  Catalina Antigua, MD [facility] [employer], [facility]

## 2021-04-14 NOTE — Progress Notes (Deleted)
    Post Partum Visit Note  Andrea [facility] is a 35 y.o. [id number] female who presents for a postpartum visit. She is 4 weeks postpartum following a {method of delivery:313099}.  I have fully reviewed the prenatal and intrapartum course. The delivery was at 37.2 gestational weeks.  Anesthesia: epidural. Postpartum course has been ***. Baby is doing well. Baby is feeding by both breast and bottle - {formula:72}. Bleeding no bleeding. Bowel function is abnormal: constipation . Bladder function is normal. Patient is not sexually active. Contraception method is abstinence. Pt is unsure about what method of BC she would like.   Postpartum depression screening: negative, score 2. .   The pregnancy intention screening data noted above was reviewed. Potential methods of contraception were discussed. The patient elected to proceed with No data recorded.    Health Maintenance Due  Topic Date Due   COVID-19 Vaccine (1) Never done   INFLUENZA VACCINE  Never done    {Common ambulatory SmartLinks:19316}  Review of Systems {ros; complete:30496}  Objective:  There were no vitals taken for this visit.   General:  {gen appearance:16600}   Breasts:  {desc; normal/abnormal/not indicated:14647}  Lungs: {lung exam:16931}  Heart:  {heart exam:5510}  Abdomen: {abdomen exam:16834}   Wound {Wound assessment:11097}  GU exam:  {desc; normal/abnormal/not indicated:14647}       Assessment:    There are no diagnoses linked to this encounter.  *** postpartum exam.   Plan:   Essential components of care per ACOG recommendations:  1.  Mood and well being: Patient with {gen negative/positive:315881} depression screening today. Reviewed local resources for support.  - Patient tobacco use? {tobacco use:25506}  - hx of drug use? {yes/no:25505}    2. Infant care and feeding:  -Patient currently breastmilk feeding? {yes/no:25502}  -Social determinants of health (SDOH) reviewed in EPIC. No concerns***The  following needs were identified***  3. Sexuality, contraception and birth spacing - Patient {DOES_DOES ZJI:96789} want a pregnancy in the next year.  Desired family size is {NUMBER 1-10:22536} children.  - Reviewed forms of contraception in tiered fashion. Patient desired {PLAN CONTRACEPTION:313102} today.   - Discussed birth spacing of 18 months  4. Sleep and fatigue -Encouraged family/partner/community support of 4 hrs of uninterrupted sleep to help with mood and fatigue  5. Physical Recovery  - Discussed patients delivery and complications. She describes her labor as {description:25511} - Patient had a {CHL AMB DELIVERY:516 625 7398}. Patient had a {laceration:25518} laceration. Perineal healing reviewed. Patient expressed understanding - Patient has urinary incontinence? {yes/no:25515} - Patient {ACTION; IS/IS FYB:01751025} safe to resume physical and sexual activity  6.  Health Maintenance - HM due items addressed {Yes or If no, why not?:20788} - Last pap smear  Diagnosis  Date Value Ref Range Status  05/31/2019   Final   - Negative for intraepithelial lesion or malignancy (NILM)   Pap smear {done:10129} at today's visit.  -Breast Cancer screening indicated? {indicated:25516}  7. Chronic Disease/Pregnancy Condition follow up: {Follow up:25499}  - PCP follow up  Ozzie Hoyle, CMA [facility] [employer], [facility]

## 2021-04-29 ENCOUNTER — Encounter: Payer: Medicaid Other | Admitting: Licensed Clinical Social Worker

## 2021-04-30 ENCOUNTER — Encounter (HOSPITAL_COMMUNITY): Payer: Self-pay

## 2021-04-30 ENCOUNTER — Ambulatory Visit (HOSPITAL_COMMUNITY)
Admission: EM | Admit: 2021-04-30 | Discharge: 2021-04-30 | Disposition: A | Discharge status: Home / Self Care | Payer: Medicaid Other

## 2021-04-30 ENCOUNTER — Other Ambulatory Visit: Payer: Self-pay

## 2021-04-30 DIAGNOSIS — B349 Viral infection, unspecified: Secondary | ICD-10-CM

## 2021-04-30 NOTE — Discharge Instructions (Addendum)
Your symptoms today are most likely being caused by a virus and should steadily improve in time it can take up to 7 to 10 days before you truly start to see a turnaround however things will get better    You can take Tylenol [phone] mg  every 6 hours and/or Ibuprofen 800 mg every 8  hours  as needed for fever reduction and pain relief.   For cough: honey 1/2 to 1 teaspoon (you can dilute the honey in water or another fluid).  You can also use guaifenesin and dextromethorphan for cough. You can use a humidifier for chest congestion and cough.  If you don't have a humidifier, you can sit in the bathroom with the hot shower running.      For sore throat: try warm salt water gargles, cepacol lozenges, throat spray, warm tea or water with lemon/honey, popsicles or ice, or OTC cold relief medicine for throat discomfort.   For congestion: take a daily anti-histamine like Zyrtec, Claritin, and a oral decongestant, such as pseudoephedrine.  You can also use Flonase 1-2 sprays in each nostril daily.   It is important to stay hydrated: drink plenty of fluids (water, gatorade/powerade/pedialyte, juices, or teas) to keep your throat moisturized and help further relieve irritation/discomfort.

## 2021-04-30 NOTE — ED Triage Notes (Signed)
Pt reports nasal congestion and body aches x 2 days.

## 2021-04-30 NOTE — ED Provider Notes (Signed)
[facility]    CSN: [id number] Arrival date & time: 04/30/21  0944      History   Chief Complaint Chief Complaint  Patient presents with   Nasal Congestion   Generalized Body Aches    HPI Rashan Patient is a 35 y.o. female.   Patient presents with nasal congestion, rhinorrhea, sore throat, denies headaches for 2 days.  Decreased food and fluid intake.  Known sick contacts, all children in household sick with illness prior to symptoms beginning.  Currently breast-feeding, recent cesarean section 6 weeks ago.  Has not attempted treatment of symptoms  Past Medical History:  Diagnosis Date   Anemia    Migraine    Vaginal Pap smear, abnormal     Patient Active Problem List   Diagnosis Date Noted   Acute blood loss anemia 03/19/2021   Delivery by emergency cesarean 03/16/2021   Pregnancy, twins, antepartum 03/15/2021   Heart palpitations 03/15/2021   GBS (group B Streptococcus carrier), +RV culture, currently pregnant 03/11/2021   Trichomoniasis 02/11/2021   Cervical cerclage suture present in third trimester 02/10/2021   Dichorionic diamniotic twin pregnancy, antepartum 11/24/2020   Short cervix affecting pregnancy 11/24/2020   AMA (advanced maternal age) multigravida 35+ 11/24/2020   Alpha thalassemia silent carrier 10/14/2020   Supervision of high risk pregnancy, antepartum 09/01/2020   Depression 08/24/2020    Past Surgical History:  Procedure Laterality Date   CERVICAL CERCLAGE     CERVICAL CERCLAGE N/A 10/08/2020   Procedure: CERCLAGE CERVICAL;  Surgeon: Adam Phenix, MD;  Location: [facility];  Service: Gynecology;  Laterality: N/A;   CERVICAL CONE BIOPSY     CESAREAN SECTION N/A 03/16/2021   Procedure: CESAREAN SECTION;  Surgeon: Venora Maples, MD;  Location: [facility];  Service: Obstetrics;  Laterality: N/A;    OB History     Gravida  5   Para  4   Term  4   Preterm      AB  1   Living  5      SAB  1   IAB      Ectopic       Multiple  1   Live Births  5            Home Medications    Prior to Admission medications   Medication Sig Start Date End Date Taking? Authorizing Provider  acetaminophen (TYLENOL) 500 MG tablet Take 2 tablets (1,000 mg total) by mouth every 6 (six) hours. 03/19/21   Reeves Forth, MD  Blood Pressure Monitoring (BLOOD PRESSURE MONITOR AUTOMAT) DEVI 1 Device by Does not apply route daily. Automatic blood pressure cuff regular size. To monitor blood pressure regularly at home. ICD-10 code: O09.90 09/01/20   Adam Phenix, MD  docusate sodium (COLACE) 100 MG capsule Take 1 capsule (100 mg total) by mouth 2 (two) times daily as needed. 12/22/20   Adam Phenix, MD  Elastic Bandages & Supports (COMFORT FIT MATERNITY SUPP LG) MISC 1 Units by Does not apply route daily. 12/22/20   Adam Phenix, MD  ferrous sulfate (FERROUSUL) 325 (65 FE) MG tablet Take 1 tablet (325 mg total) by mouth every other day. 01/07/21   Milas Hock, MD  folic acid (FOLVITE) 1 MG tablet Take 1 tablet (1 mg total) by mouth daily. 01/06/21   Brock Bad, MD  ibuprofen (ADVIL) 600 MG tablet Take 1 tablet (600 mg total) by mouth every 6 (six) hours as needed for  mild pain or moderate pain. 03/19/21   Reeves Forth, MD  metroNIDAZOLE (FLAGYL) 500 MG tablet Take 1 tablet (500 mg total) by mouth 2 (two) times daily. 03/11/21   Hermina Staggers, MD  Misc. Devices (GOJJI WEIGHT SCALE) MISC 1 Device by Does not apply route daily as needed. To weight self daily as needed at home. ICD-10 code: O09.90 09/01/20   Adam Phenix, MD  oxyCODONE (OXY IR/ROXICODONE) 5 MG immediate release tablet Take 1 tablet (5 mg total) by mouth every 4 (four) hours as needed for severe pain. 03/19/21   Arabella Merles, CNM  Prenat-Fe Poly-Methfol-FA-DHA (VITAFOL ULTRA) 29-0.6-0.4-200 MG CAPS Take 1 tablet by mouth daily. 01/06/21   Brock Bad, MD  propranolol (INDERAL) 10 MG tablet Take 1 tablet (10 mg total) by mouth 2 (two) times daily  as needed. Take and bedtime and as needed 02/24/21   Donette Larry, CNM  simethicone (MYLICON) 80 MG chewable tablet Chew 1 tablet (80 mg total) by mouth 3 (three) times daily after meals. 03/19/21   Reeves Forth, MD    Family History Family History  Problem Relation Age of Onset   Hypertension Mother    Diabetes Mother     Social History Social History   Tobacco Use   Smoking status: Never   Smokeless tobacco: Never  Vaping Use   Vaping Use: Never used  Substance Use Topics   Alcohol use: No    Alcohol/week: 0.0 standard drinks   Drug use: Never     Allergies   Patient has no known allergies.   Review of Systems Review of Systems  HENT:  Positive for congestion, rhinorrhea and sore throat. Negative for dental problem, drooling, ear discharge, ear pain, facial swelling, hearing loss, mouth sores, nosebleeds, postnasal drip, sinus pressure, sinus pain, sneezing, tinnitus, trouble swallowing and voice change.   Respiratory: Negative.    Cardiovascular: Negative.   Gastrointestinal:  Positive for diarrhea. Negative for abdominal distention, abdominal pain, anal bleeding, blood in stool, constipation, nausea, rectal pain and vomiting.  Skin: Negative.   Neurological:  Positive for headaches. Negative for dizziness, tremors, seizures, syncope, facial asymmetry, speech difficulty, weakness, light-headedness and numbness.    Physical Exam Triage Vital Signs ED Triage Vitals  Enc Vitals Group     BP 04/30/21 1141 (!) 109/50     Pulse Rate 04/30/21 1141 82     Resp 04/30/21 1141 18     Temp 04/30/21 1141 98.4 F (36.9 C)     Temp Source 04/30/21 1141 Oral     SpO2 04/30/21 1141 100 %     Weight --      Height --      Head Circumference --      Peak Flow --      Pain Score 04/30/21 1139 0     Pain Loc --      Pain Edu? --      Excl. in GC? --    No data found.  Updated Vital Signs BP (!) 109/50 (BP Location: Left Arm)    Pulse 82    Temp 98.4 F (36.9 C)  (Oral)    Resp 18    SpO2 100%   Visual Acuity Right Eye Distance:   Left Eye Distance:   Bilateral Distance:    Right Eye Near:   Left Eye Near:    Bilateral Near:     Physical Exam Constitutional:      Appearance: Normal appearance. She is normal  weight.  HENT:     Head: Normocephalic.     Right Ear: Tympanic membrane, ear canal and external ear normal.     Left Ear: Tympanic membrane, ear canal and external ear normal.     Nose: Nose normal.     Mouth/Throat:     Mouth: Mucous membranes are moist.     Pharynx: Posterior oropharyngeal erythema present.  Eyes:     Extraocular Movements: Extraocular movements intact.  Cardiovascular:     Rate and Rhythm: Normal rate and regular rhythm.     Pulses: Normal pulses.     Heart sounds: Normal heart sounds.  Pulmonary:     Effort: Pulmonary effort is normal.     Breath sounds: Normal breath sounds.  Musculoskeletal:     Cervical back: Normal range of motion.  Lymphadenopathy:     Cervical: Cervical adenopathy present.  Skin:    General: Skin is warm and dry.  Neurological:     General: No focal deficit present.     Mental Status: She is alert and oriented to person, place, and time. Mental status is at baseline.  Psychiatric:        Mood and Affect: Mood normal.        Behavior: Behavior normal.     UC Treatments / Results  Labs (all labs ordered are listed, but only abnormal results are displayed) Labs Reviewed - No data to display  EKG   Radiology No results found.  Procedures Procedures (including critical care time)  Medications Ordered in UC Medications - No data to display  Initial Impression / Assessment and Plan / UC Course  I have reviewed the triage vital signs and the nursing notes.  Pertinent labs & imaging results that were available during my care of the patient were reviewed by me and considered in my medical decision making (see chart for details).  Viral illness  Respiratory panel  completed on child, will use results for this patient as well as, discussed with patient, recommended continued conservative management due to breast-feeding use of over-the-counter Tylenol or ibuprofen for fever management, urgent care follow-up as needed Final Clinical Impressions(s) / UC Diagnoses   Final diagnoses:  None   Discharge Instructions   None    ED Prescriptions   None    PDMP not reviewed this encounter.   Valinda Hoar, NP 04/30/21 1236

## 2021-05-06 ENCOUNTER — Encounter: Payer: Medicaid Other | Admitting: Licensed Clinical Social Worker

## 2021-05-06 ENCOUNTER — Telehealth: Payer: Self-pay | Admitting: Licensed Clinical Social Worker

## 2021-05-06 NOTE — Telephone Encounter (Signed)
Called pt regarding scheduled mychart visit. Left message requesting callback  

## 2021-07-13 ENCOUNTER — Encounter: Payer: Self-pay | Admitting: Obstetrics and Gynecology

## 2021-07-13 ENCOUNTER — Other Ambulatory Visit (HOSPITAL_COMMUNITY)
Admission: RE | Admit: 2021-07-13 | Discharge: 2021-07-13 | Disposition: A | Discharge status: Home / Self Care | Payer: Medicaid Other | Source: Ambulatory Visit | Attending: Obstetrics & Gynecology | Admitting: Obstetrics & Gynecology

## 2021-07-13 ENCOUNTER — Ambulatory Visit (INDEPENDENT_AMBULATORY_CARE_PROVIDER_SITE_OTHER): Payer: Medicaid Other | Admitting: Obstetrics and Gynecology

## 2021-07-13 ENCOUNTER — Other Ambulatory Visit: Payer: Self-pay

## 2021-07-13 DIAGNOSIS — Z01419 Encounter for gynecological examination (general) (routine) without abnormal findings: Secondary | ICD-10-CM | POA: Insufficient documentation

## 2021-07-13 DIAGNOSIS — O0990 Supervision of high risk pregnancy, unspecified, unspecified trimester: Secondary | ICD-10-CM

## 2021-07-13 DIAGNOSIS — Z30017 Encounter for initial prescription of implantable subdermal contraceptive: Secondary | ICD-10-CM | POA: Diagnosis not present

## 2021-07-13 DIAGNOSIS — Z202 Contact with and (suspected) exposure to infections with a predominantly sexual mode of transmission: Secondary | ICD-10-CM

## 2021-07-13 DIAGNOSIS — A5901 Trichomonal vulvovaginitis: Secondary | ICD-10-CM

## 2021-07-13 HISTORY — DX: Trichomonal vulvovaginitis: A59.01

## 2021-07-13 LAB — POCT URINE PREGNANCY: Preg Test, Ur: NEGATIVE

## 2021-07-13 MED ORDER — ETONOGESTREL 68 MG ~~LOC~~ IMPL
68.0000 mg | DRUG_IMPLANT | Freq: Once | SUBCUTANEOUS | Status: AC
  Administered 2021-07-13: 68 mg via SUBCUTANEOUS

## 2021-07-13 NOTE — Progress Notes (Signed)
Pt in office for annual exam and Nexplanon insertion. She has no concerns today.   PHQ9=2 GAD7=3

## 2021-07-13 NOTE — Patient Instructions (Signed)
Etonogestrel Implant What is this medication? ETONOGESTREL (et oh noe JES trel) prevents ovulation and pregnancy. It belongs to a group of medications called contraceptives. This medication is a progestin hormone. This medicine may be used for other purposes; ask your health care provider or pharmacist if you have questions. COMMON BRAND NAME(S): Implanon, Nexplanon What should I tell my care team before I take this medication? They need to know if you have any of these conditions: Abnormal vaginal bleeding Blood vessel disease or blood clots Breast, cervical, endometrial, ovarian, liver, or uterine cancer Diabetes Gallbladder disease Heart disease or recent heart attack High blood pressure High cholesterol or triglycerides Kidney disease Liver disease Migraine headaches Seizures Stroke Tobacco smoker An unusual or allergic reaction to etonogestrel, anesthetics or antiseptics, other medications, foods, dyes, or preservatives Pregnant or trying to get pregnant Breast-feeding How should I use this medication? This device is inserted just under the skin on the inner side of your upper arm by your care team. Talk to your care team about the use of this medication in children. Special care may be needed. Overdosage: If you think you have taken too much of this medicine contact a poison control center or emergency room at once. NOTE: This medicine is only for you. Do not share this medicine with others. What if I miss a dose? This does not apply. What may interact with this medication? Do not take this medication with any of the following: Amprenavir Fosamprenavir This medication may also interact with the following: Acitretin Aprepitant Armodafinil Bexarotene Bosentan Carbamazepine Certain medications for fungal infections like fluconazole, ketoconazole, itraconazole and voriconazole Certain medications to treat hepatitis, HIV or  AIDS Cyclosporine Felbamate Griseofulvin Lamotrigine Modafinil Oxcarbazepine Phenobarbital Phenytoin Primidone Rifabutin Rifampin Rifapentine St. John's wort Topiramate This list may not describe all possible interactions. Give your health care provider a list of all the medicines, herbs, non-prescription drugs, or dietary supplements you use. Also tell them if you smoke, drink alcohol, or use illegal drugs. Some items may interact with your medicine. What should I watch for while using this medication? This product does not protect you against HIV infection (AIDS) or other sexually transmitted diseases. You should be able to feel the implant by pressing your fingertips over the skin where it was inserted. Contact your care team if you cannot feel the implant, and use a non-hormonal birth control method (such as condoms) until your care team confirms that the implant is in place. Contact your care team if you think that the implant may have broken or become bent while in your arm. You will receive a user card from your care team after the implant is inserted. The card is a record of the location of the implant in your upper arm and when it should be removed. Keep this card with your health records. What side effects may I notice from receiving this medication? Side effects that you should report to your care team as soon as possible: Allergic reactions--skin rash, itching, hives, swelling of the face, lips, tongue, or throat Blood clot--pain, swelling, or warmth in the leg, shortness of breath, chest pain Gallbladder problems--severe stomach pain, nausea, vomiting, fever Increase in blood pressure Liver injury--right upper belly pain, loss of appetite, nausea, light-colored stool, dark yellow or brown urine, yellowing skin or eyes, unusual weakness or fatigue New or worsening migraines or headaches Pain, redness, or irritation at injection site Stroke--sudden numbness or weakness of the  face, arm, or leg, trouble speaking, confusion, trouble walking, loss   of balance or coordination, dizziness, severe headache, change in vision Unusual vaginal discharge, itching, or odor Worsening mood, feelings of depression Side effects that usually do not require medical attention (report to your care team if they continue or are bothersome): Breast pain or tenderness Dark patches of skin on the face or other sun-exposed areas Irregular menstrual cycles or spotting Nausea Weight gain This list may not describe all possible side effects. Call your doctor for medical advice about side effects. You may report side effects to FDA at 1-800-FDA-1088. Where should I keep my medication? This medication is given in a hospital or clinic and will not be stored at home. NOTE: This sheet is a summary. It may not cover all possible information. If you have questions about this medicine, talk to your doctor, pharmacist, or health care provider.  2022 Elsevier/Gold Standard (2021-01-20 00:00:00) Nexplanon Instructions After Insertion  Keep bandage clean and dry for 24 hours  May use ice/Tylenol/Ibuprofen for soreness or pain  If you develop fever, drainage or increased warmth from incision site-contact office immediately  Health Maintenance, Female Adopting a healthy lifestyle and getting preventive care are important in promoting health and wellness. Ask your health care provider about: The right schedule for you to have regular tests and exams. Things you can do on your own to prevent diseases and keep yourself healthy. What should I know about diet, weight, and exercise? Eat a healthy diet  Eat a diet that includes plenty of vegetables, fruits, low-fat dairy products, and lean protein. Do not eat a lot of foods that are high in solid fats, added sugars, or sodium. Maintain a healthy weight Body mass index (BMI) is used to identify weight problems. It estimates body fat based on height and  weight. Your health care provider can help determine your BMI and help you achieve or maintain a healthy weight. Get regular exercise Get regular exercise. This is one of the most important things you can do for your health. Most adults should: Exercise for at least 150 minutes each week. The exercise should increase your heart rate and make you sweat (moderate-intensity exercise). Do strengthening exercises at least twice a week. This is in addition to the moderate-intensity exercise. Spend less time sitting. Even light physical activity can be beneficial. Watch cholesterol and blood lipids Have your blood tested for lipids and cholesterol at 36 years of age, then have this test every 5 years. Have your cholesterol levels checked more often if: Your lipid or cholesterol levels are high. You are older than 36 years of age. You are at high risk for heart disease. What should I know about cancer screening? Depending on your health history and family history, you may need to have cancer screening at various ages. This may include screening for: Breast cancer. Cervical cancer. Colorectal cancer. Skin cancer. Lung cancer. What should I know about heart disease, diabetes, and high blood pressure? Blood pressure and heart disease High blood pressure causes heart disease and increases the risk of stroke. This is more likely to develop in people who have high blood pressure readings or are overweight. Have your blood pressure checked: Every 3-5 years if you are 56-62 years of age. Every year if you are 62 years old or older. Diabetes Have regular diabetes screenings. This checks your fasting blood sugar level. Have the screening done: Once every three years after age 77 if you are at a normal weight and have a low risk for diabetes. More often and at  a younger age if you are overweight or have a high risk for diabetes. What should I know about preventing infection? Hepatitis B If you have a  higher risk for hepatitis B, you should be screened for this virus. Talk with your health care provider to find out if you are at risk for hepatitis B infection. Hepatitis C Testing is recommended for: Everyone born from [birth year] through 1965. Anyone with known risk factors for hepatitis C. Sexually transmitted infections (STIs) Get screened for STIs, including gonorrhea and chlamydia, if: You are sexually active and are younger than 36 years of age. You are older than 36 years of age and your health care provider tells you that you are at risk for this type of infection. Your sexual activity has changed since you were last screened, and you are at increased risk for chlamydia or gonorrhea. Ask your health care provider if you are at risk. Ask your health care provider about whether you are at high risk for HIV. Your health care provider may recommend a prescription medicine to help prevent HIV infection. If you choose to take medicine to prevent HIV, you should first get tested for HIV. You should then be tested every 3 months for as long as you are taking the medicine. Pregnancy If you are about to stop having your period (premenopausal) and you may become pregnant, seek counseling before you get pregnant. Take 400 to 800 micrograms (mcg) of folic acid every day if you become pregnant. Ask for birth control (contraception) if you want to prevent pregnancy. Osteoporosis and menopause Osteoporosis is a disease in which the bones lose minerals and strength with aging. This can result in bone fractures. If you are 11 years old or older, or if you are at risk for osteoporosis and fractures, ask your health care provider if you should: Be screened for bone loss. Take a calcium or vitamin D supplement to lower your risk of fractures. Be given hormone replacement therapy (HRT) to treat symptoms of menopause. Follow these instructions at home: Alcohol use Do not drink alcohol if: Your health care  provider tells you not to drink. You are pregnant, may be pregnant, or are planning to become pregnant. If you drink alcohol: Limit how much you have to: 0-1 drink a day. Know how much alcohol is in your drink. In the [place] one drink equals one 12 oz bottle of beer (355 mL), one 5 oz glass of wine (148 mL), or one 1 oz glass of hard liquor (44 mL). Lifestyle Do not use any products that contain nicotine or tobacco. These products include cigarettes, chewing tobacco, and vaping devices, such as e-cigarettes. If you need help quitting, ask your health care provider. Do not use street drugs. Do not share needles. Ask your health care provider for help if you need support or information about quitting drugs. General instructions Schedule regular health, dental, and eye exams. Stay current with your vaccines. Tell your health care provider if: You often feel depressed. You have ever been abused or do not feel safe at home. Summary Adopting a healthy lifestyle and getting preventive care are important in promoting health and wellness. Follow your health care provider's instructions about healthy diet, exercising, and getting tested or screened for diseases. Follow your health care provider's instructions on monitoring your cholesterol and blood pressure. This information is not intended to replace advice given to you by your health care provider. Make sure you discuss any questions you have with your health  care provider. Document Revised: 09/22/2020 Document Reviewed: 09/22/2020 Elsevier Patient Education  2022 [employer].

## 2021-07-13 NOTE — Progress Notes (Signed)
Andrea Logan is a 36 y.o. [id number] female here for a routine annual gynecologic exam.  Current complaints: Desires STD testing and Nexplanon insertion.   Denies abnormal vaginal bleeding, discharge, pelvic pain, problems with intercourse or other gynecologic concerns.    Gynecologic History LMP: currently Contraception: condoms Last Pap: 2019. Results were: normal Last mammogram: NA.   Obstetric History OB History  Gravida Para Term Preterm AB Living  5 4 4   1 5   SAB IAB Ectopic Multiple Live Births  1     1 5     # Outcome Date GA Lbr Len/2nd Weight Sex Delivery Anes PTL Lv  5A Term 03/16/21 [redacted]w[redacted]d 04:24 / 00:05 6 lb 8.1 oz (2.95 kg) M Vag-Spont EPI  LIV  5B Term 03/16/21 [redacted]w[redacted]d 04:24 / 00:21 4 lb 13.3 oz (2.19 kg) F CS-LTranv EPI  LIV  4 Term 11/25/13 [redacted]w[redacted]d  7 lb 10 oz (3.459 kg) M Vag-Spont   LIV  3 Term 04/20/12 [redacted]w[redacted]d  7 lb 8 oz (3.402 kg) M Vag-Spont  Y LIV  2 Term 06/20/10 [redacted]w[redacted]d  9 lb 14 oz (4.479 kg) F Vag-Spont   LIV  1 SAB 02/26/10 [redacted]w[redacted]d           Past Medical History:  Diagnosis Date   Alpha thalassemia silent carrier 10/14/2020   Anemia    Migraine    Vaginal Pap smear, abnormal     Past Surgical History:  Procedure Laterality Date   CERVICAL CERCLAGE     CERVICAL CERCLAGE N/A 10/08/2020   Procedure: CERCLAGE CERVICAL;  Surgeon: 10/16/2020, MD;  Location: [facility] LD ORS;  Service: Gynecology;  Laterality: N/A;   CERVICAL CONE BIOPSY     CESAREAN SECTION N/A 03/16/2021   Procedure: CESAREAN SECTION;  Surgeon: Adam Phenix, MD;  Location: [facility] LD ORS;  Service: Obstetrics;  Laterality: N/A;    Current Outpatient Medications on File Prior to Visit  Medication Sig Dispense Refill   folic acid (FOLVITE) 1 MG tablet Take 1 tablet (1 mg total) by mouth daily. 90 tablet 4   ibuprofen (ADVIL) 600 MG tablet Take 1 tablet (600 mg total) by mouth every 6 (six) hours as needed for mild pain or moderate pain. 30 tablet 0   No current facility-administered medications on  file prior to visit.    No Known Allergies  Social History   Socioeconomic History   Marital status: Single    Spouse name: Not on file   Number of children: 3   Years of education: Not on file   Highest education level: Associate degree: occupational, 03/18/2021, or vocational program  Occupational History   Not on file  Tobacco Use   Smoking status: Never   Smokeless tobacco: Never  Vaping Use   Vaping Use: Never used  Substance and Sexual Activity   Alcohol use: No    Alcohol/week: 0.0 standard drinks   Drug use: Never   Sexual activity: Yes    Birth control/protection: Implant  Other Topics Concern   Not on file  Social History Narrative   Not on file   Social Determinants of Health   Financial Resource Strain: Not on file  Food Insecurity: Not on file  Transportation Needs: Not on file  Physical Activity: Not on file  Stress: Not on file  Social Connections: Not on file  Intimate Partner Violence: Not on file    Family History  Problem Relation Age of Onset   Hypertension Mother    Diabetes  Mother     The following portions of the patient's history were reviewed and updated as appropriate: allergies, current medications, past family history, past medical history, past social history, past surgical history and problem list.  Review of Systems Pertinent items noted in HPI and remainder of comprehensive ROS otherwise negative.   Objective:  BP 102/67    Pulse 76    Wt 240 lb (108.9 kg)    BMI 35.44 kg/m  CONSTITUTIONAL: Well-developed, well-nourished female in no acute distress.  HENT:  Normocephalic, atraumatic, External right and left ear normal. Oropharynx is clear and moist EYES: Conjunctivae and EOM are normal. Pupils are equal, round, and reactive to light. No scleral icterus.  NECK: Normal range of motion, supple, no masses.  Normal thyroid.  SKIN: Skin is warm and dry. No rash noted. Not diaphoretic. No erythema. No pallor. NEUROLGIC: Alert and  oriented to person, place, and time. Normal reflexes, muscle tone coordination. No cranial nerve deficit noted. PSYCHIATRIC: Normal mood and affect. Normal behavior. Normal judgment and thought content. CARDIOVASCULAR: Normal heart rate noted, regular rhythm RESPIRATORY: Clear to auscultation bilaterally. Effort and breath sounds normal, no problems with respiration noted. BREASTS: Symmetric in size. No masses, skin changes, nipple drainage, or lymphadenopathy. ABDOMEN: Soft, normal bowel sounds, no distention noted.  No tenderness, rebound or guarding.  PELVIC: Normal appearing external genitalia; normal appearing vaginal mucosa and cervix.  No abnormal discharge noted.  Pap smear obtained.  Normal uterine size, no other palpable masses, no uterine or adnexal tenderness. MUSCULOSKELETAL: Normal range of motion. No tenderness.  No cyanosis, clubbing, or edema.  2+ distal pulses.        GYNECOLOGY CLINIC PROCEDURE NOTE  Andrea Logan is a 36 y.o. [id number] here for Nexplanon insertion.     Nexplanon Insertion Procedure Patient identified, informed consent performed, consent signed.   Patient does understand that irregular bleeding is a very common side effect of this medication. She was advised to have backup contraception for one week after placement. Pregnancy test in clinic today was negative.  Appropriate time out taken.  Patient's left arm was prepped and draped in the usual sterile fashion.. The ruler used to measure and mark insertion area.  Patient was prepped with alcohol swab and then injected with 3 ml of 1% lidocaine.  She was prepped with betadine, Nexplanon removed from packaging,  Device confirmed in needle, then inserted full length of needle and withdrawn per handbook instructions. Nexplanon was able to palpated in the patient's arm; patient palpated the insert herself. There was minimal blood loss.  Patient insertion site covered with guaze and a pressure bandage to reduce any  bruising.  The patient tolerated the procedure well and was given post procedure instructions.     Assessment:  Annual gynecologic examination with pap smear STD exposure Nexplanon insertion Plan:  Will follow up results of pap smear and manage accordingly. STD testing as per pt request Routine preventative health maintenance measures emphasized. Please refer to After Visit Summary for other counseling recommendations.    Hermina Staggers, MD, FACOG Attending Obstetrician & Gynecologist Center for [facility], [facility]

## 2021-07-14 LAB — HIV ANTIBODY (ROUTINE TESTING W REFLEX): HIV Screen 4th Generation wRfx: NONREACTIVE

## 2021-07-14 LAB — RPR: RPR Ser Ql: NONREACTIVE

## 2021-07-14 LAB — CERVICOVAGINAL ANCILLARY ONLY
Bacterial Vaginitis (gardnerella): NEGATIVE
Candida Glabrata: NEGATIVE
Candida Vaginitis: NEGATIVE
Chlamydia: NEGATIVE
Comment: NEGATIVE
Comment: NEGATIVE
Comment: NEGATIVE
Comment: NEGATIVE
Comment: NEGATIVE
Comment: NORMAL
Neisseria Gonorrhea: NEGATIVE
Trichomonas: POSITIVE — AB

## 2021-07-14 LAB — HEPATITIS C ANTIBODY: Hep C Virus Ab: NONREACTIVE

## 2021-07-14 LAB — HEPATITIS B SURFACE ANTIGEN: Hepatitis B Surface Ag: NEGATIVE

## 2021-07-15 ENCOUNTER — Other Ambulatory Visit: Payer: Self-pay

## 2021-07-15 DIAGNOSIS — A599 Trichomoniasis, unspecified: Secondary | ICD-10-CM

## 2021-07-15 MED ORDER — METRONIDAZOLE 500 MG PO TABS: 500.0000 mg | ORAL_TABLET | Freq: Two times a day (BID) | ORAL | 0 refills | Status: DC

## 2021-07-17 LAB — CYTOLOGY - PAP
Comment: NEGATIVE
Diagnosis: NEGATIVE
Diagnosis: REACTIVE
High risk HPV: NEGATIVE

## 2021-07-31 ENCOUNTER — Ambulatory Visit: Payer: Medicaid Other | Admitting: Obstetrics & Gynecology

## 2021-08-12 ENCOUNTER — Ambulatory Visit (INDEPENDENT_AMBULATORY_CARE_PROVIDER_SITE_OTHER): Payer: Medicaid Other

## 2021-08-12 ENCOUNTER — Other Ambulatory Visit (HOSPITAL_COMMUNITY)
Admission: RE | Admit: 2021-08-12 | Discharge: 2021-08-12 | Disposition: A | Discharge status: Home / Self Care | Payer: Medicaid Other | Source: Ambulatory Visit | Attending: Obstetrics and Gynecology | Admitting: Obstetrics and Gynecology

## 2021-08-12 DIAGNOSIS — B75 Trichinellosis: Secondary | ICD-10-CM | POA: Insufficient documentation

## 2021-08-12 DIAGNOSIS — A599 Trichomoniasis, unspecified: Secondary | ICD-10-CM

## 2021-08-12 NOTE — Progress Notes (Signed)
Pt is in the office for TOC for Trich. ?Pt states that she completed antibiotics and denies abnormal symptoms today. ?

## 2021-08-13 LAB — CERVICOVAGINAL ANCILLARY ONLY
Bacterial Vaginitis (gardnerella): NEGATIVE
Candida Glabrata: NEGATIVE
Candida Vaginitis: NEGATIVE
Chlamydia: NEGATIVE
Comment: NEGATIVE
Comment: NEGATIVE
Comment: NEGATIVE
Comment: NEGATIVE
Comment: NEGATIVE
Comment: NORMAL
Neisseria Gonorrhea: NEGATIVE
Trichomonas: NEGATIVE

## 2021-08-13 NOTE — Progress Notes (Signed)
Patient was assessed and managed by nursing staff during this encounter. I have reviewed the chart and agree with the documentation and plan. I have also made any necessary editorial changes. ? ?Geraldean Walen A Tryniti Laatsch, MD ?08/13/2021 8:21 AM   ?

## 2021-10-08 ENCOUNTER — Encounter: Payer: Self-pay | Admitting: Obstetrics & Gynecology

## 2021-10-08 ENCOUNTER — Other Ambulatory Visit (HOSPITAL_COMMUNITY)
Admission: RE | Admit: 2021-10-08 | Discharge: 2021-10-08 | Disposition: A | Discharge status: Home / Self Care | Payer: Medicaid Other | Source: Ambulatory Visit | Attending: Obstetrics | Admitting: Obstetrics

## 2021-10-08 ENCOUNTER — Ambulatory Visit (INDEPENDENT_AMBULATORY_CARE_PROVIDER_SITE_OTHER): Payer: Medicaid Other | Admitting: Obstetrics & Gynecology

## 2021-10-08 ENCOUNTER — Ambulatory Visit (INDEPENDENT_AMBULATORY_CARE_PROVIDER_SITE_OTHER): Payer: Medicaid Other | Admitting: Licensed Clinical Social Worker

## 2021-10-08 VITALS — BP 117/72 | HR 90 | Ht 69.0 in | Wt 243.0 lb

## 2021-10-08 DIAGNOSIS — Z202 Contact with and (suspected) exposure to infections with a predominantly sexual mode of transmission: Secondary | ICD-10-CM | POA: Insufficient documentation

## 2021-10-08 DIAGNOSIS — Z975 Presence of (intrauterine) contraceptive device: Secondary | ICD-10-CM

## 2021-10-08 DIAGNOSIS — F321 Major depressive disorder, single episode, moderate: Secondary | ICD-10-CM

## 2021-10-08 DIAGNOSIS — N921 Excessive and frequent menstruation with irregular cycle: Secondary | ICD-10-CM | POA: Diagnosis not present

## 2021-10-08 MED ORDER — MEDROXYPROGESTERONE ACETATE 10 MG PO TABS: 10.0000 mg | ORAL_TABLET | Freq: Every day | ORAL | 1 refills | Status: DC

## 2021-10-08 NOTE — Progress Notes (Signed)
36 y.o GYN presents for bleeding with Nexplanon, some days are lighter and most days are heavier. C/o vaginal discharge, odor.   Last PAP 07/13/2021

## 2021-10-08 NOTE — Progress Notes (Signed)
Patient ID: Andrea Logan, female   DOB: [birth year], 36 y.o.   MRN: [id number] Cc: irregular vaginal bleeding  36 y.o. Patient's last menstrual period was 10/03/2021 (exact date). [id number] Patient had nexplanon placed 3 months ago and she has annoying irregular bleeding which is occasionally heavy. She requests testing for vaginitis as well.   Current Outpatient Medications:    medroxyPROGESTERone (PROVERA) 10 MG tablet, Take 1 tablet (10 mg total) by mouth daily., Disp: 30 tablet, Rfl: 1   folic acid (FOLVITE) 1 MG tablet, Take 1 tablet (1 mg total) by mouth daily. (Patient not taking: Reported on 10/08/2021), Disp: 90 tablet, Rfl: 4   ibuprofen (ADVIL) 600 MG tablet, Take 1 tablet (600 mg total) by mouth every 6 (six) hours as needed for mild pain or moderate pain. (Patient not taking: Reported on 10/08/2021), Disp: 30 tablet, Rfl: 0   metroNIDAZOLE (FLAGYL) 500 MG tablet, Take 1 tablet (500 mg total) by mouth 2 (two) times daily. (Patient not taking: Reported on 10/08/2021), Disp: 14 tablet, Rfl: 0 No Known Allergies Past Medical History:  Diagnosis Date   Alpha thalassemia silent carrier 10/14/2020   Anemia    Migraine    Vaginal Pap smear, abnormal      Blood pressure 117/72, pulse 90, height 5\' 9"  (1.753 m), weight 243 lb (110.2 kg), last menstrual period 10/03/2021, not currently breastfeeding. NAD Appears well   Imp Possible exposure to STD - Plan: Cervicovaginal ancillary only( [facility]), medroxyPROGESTERone (PROVERA) 10 MG tablet  Breakthrough bleeding on Nexplanon No orders of the defined types were placed in this encounter.  Vaginal probe sent and will add 30 days of provera for BTB.   Woodroe Mode, MD 10/08/2021

## 2021-10-09 NOTE — BH Specialist Note (Signed)
Integrated Behavioral Health Follow Up In-Person Visit  MRN: [id number] Name: Andrea Logan  Number of Integrated Behavioral Health Clinician visits: 1 Session Start time:  230pm Session End time: 248pm Total time in minutes: 18 mins in person at [facility]   Types of Service: Individual psychotherapy  Interpretor:No. Interpretor Name and Language: none  Subjective: Andrea Logan is a 36 y.o. female accompanied by n/a Patient was referred by Dr Debroah Loop for hx of depression. Patient reports the following symptoms/concerns: relationship conflict, depressed mood, feeling overwhelmed, and difficulty sleeping  Duration of problem: over one year; Severity of problem: mild  Objective: Mood: good and Affect: Appropriate Risk of harm to self or others: No plan to harm self or others  Life Context: Family and Social: Lives in [employer]/Work: work at home  Self-Care: n/a Life Changes: n/a  Patient and/or Family's Strengths/Protective Factors: Concrete supports in place (healthy food, safe environments, etc.)  Goals Addressed: Patient will:  Reduce symptoms of: depression   Increase knowledge and/or ability of: coping skills and stress reduction   Demonstrate ability to: Increase healthy adjustment to current life circumstances  Progress towards Goals: Ongoing  Interventions: Interventions utilized:  Supportive Counseling Standardized Assessments completed: Not Needed  Patient and/or Family Response: Ms. Moret responded well to visit  Assessment: Patient currently experiencing major depressive disorder.   Patient may benefit from integrated behavioral health.  Plan: Follow up with behavioral health clinician on : prn Behavioral recommendations: Deep breathing exercise, develop a routine and create boundaries,  collaborate with family justice center for protective order Referral(s): Integrated [employer] (In Clinic) "From scale of 1-10, how likely are  you to follow plan?":    Gwyndolyn Saxon, LCSW

## 2021-10-13 ENCOUNTER — Other Ambulatory Visit: Payer: Self-pay | Admitting: Obstetrics and Gynecology

## 2021-10-13 DIAGNOSIS — A599 Trichomoniasis, unspecified: Secondary | ICD-10-CM

## 2021-10-13 LAB — CERVICOVAGINAL ANCILLARY ONLY
Bacterial Vaginitis (gardnerella): POSITIVE — AB
Candida Glabrata: NEGATIVE
Candida Vaginitis: NEGATIVE
Chlamydia: NEGATIVE
Comment: NEGATIVE
Comment: NEGATIVE
Comment: NEGATIVE
Comment: NEGATIVE
Comment: NEGATIVE
Comment: NORMAL
Neisseria Gonorrhea: NEGATIVE
Trichomonas: NEGATIVE

## 2021-10-14 MED ORDER — METRONIDAZOLE 500 MG PO TABS: 500.0000 mg | ORAL_TABLET | Freq: Two times a day (BID) | ORAL | 0 refills | Status: DC

## 2021-10-14 NOTE — Telephone Encounter (Signed)
Flagyl sent to pharmacy for BV 

## 2021-10-20 ENCOUNTER — Ambulatory Visit: Payer: Medicaid Other | Admitting: Obstetrics

## 2021-11-18 IMAGING — US US MFM OB LIMITED
1 series · 14 of 28 positions shown · non-contrast
Comparison: none

[Series 1: us mfm ob limited · 14 of 83 slices shown]
[im 4/83]
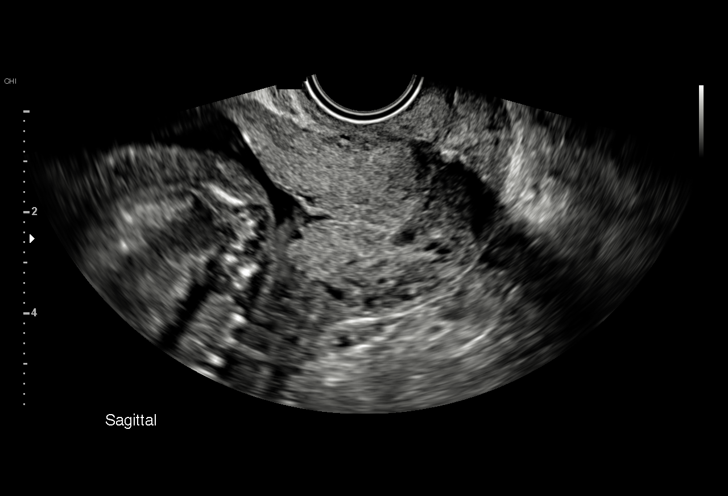
[im 10/83]
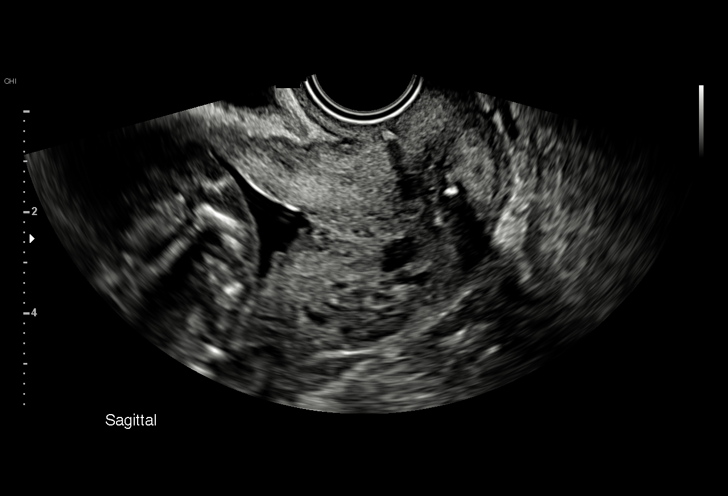
[im 16/83]
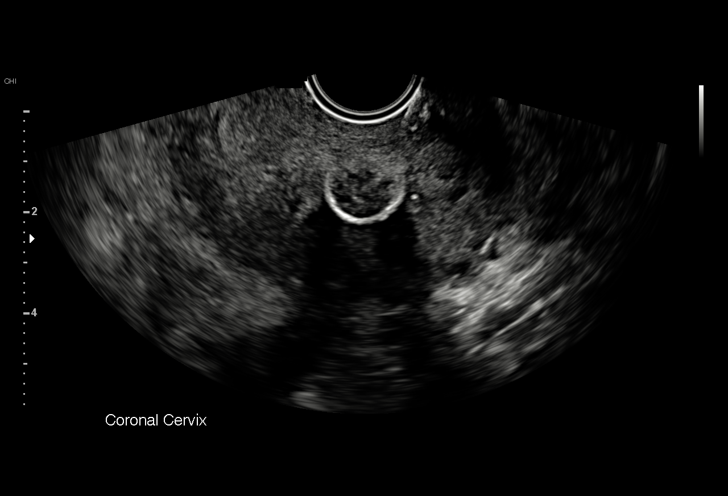
[im 22/83]
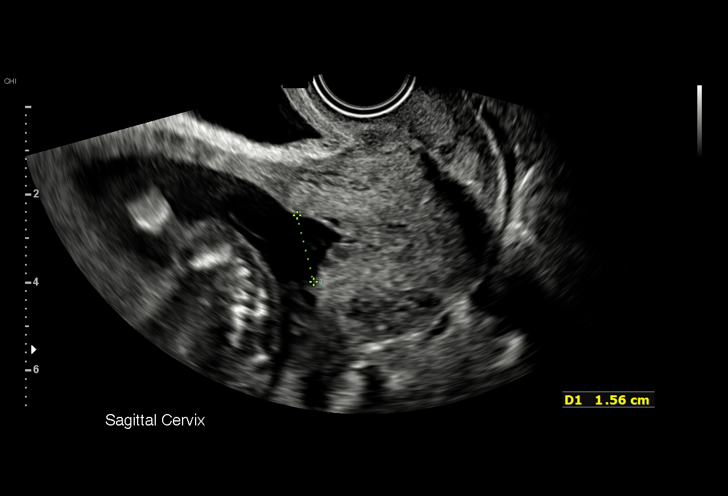
[im 28/83]
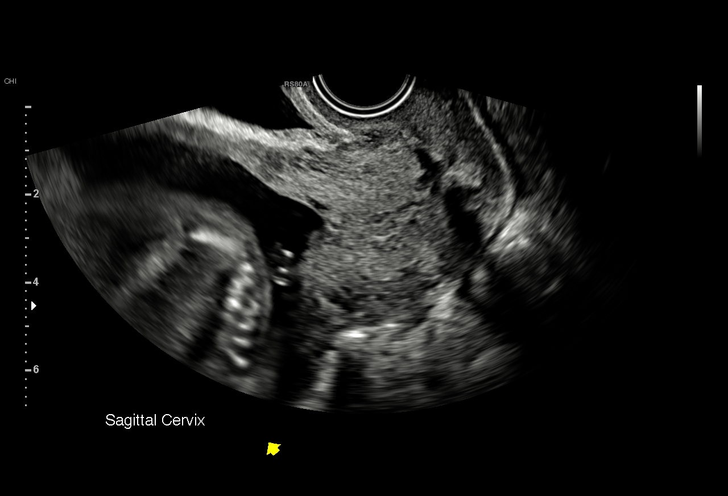
[im 34/83]
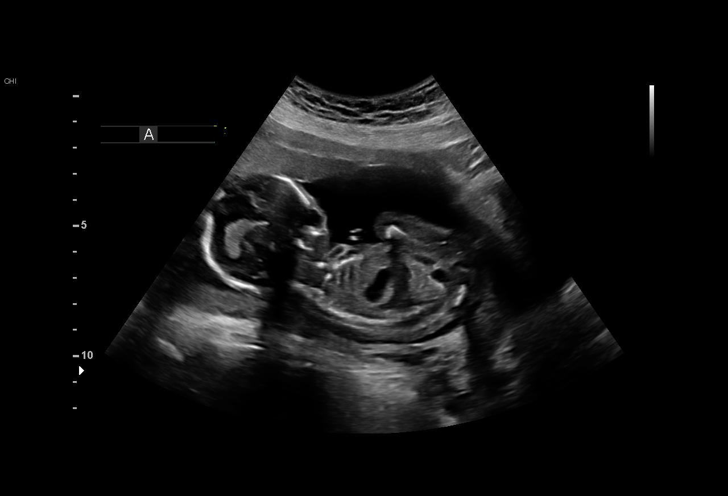
[im 40/83]
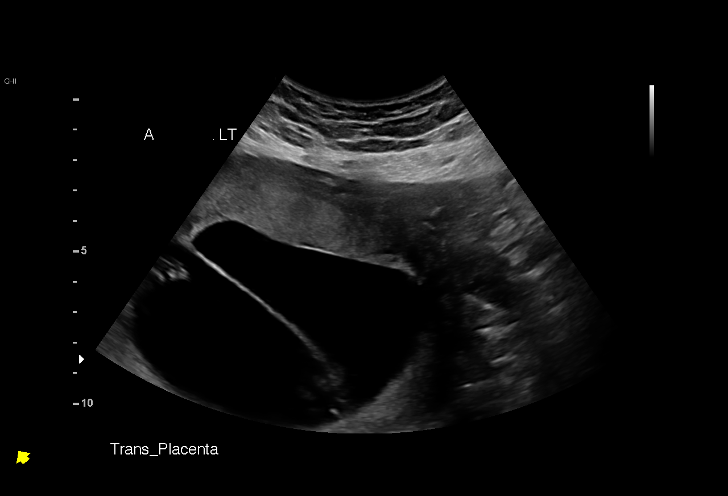
[im 46/83]
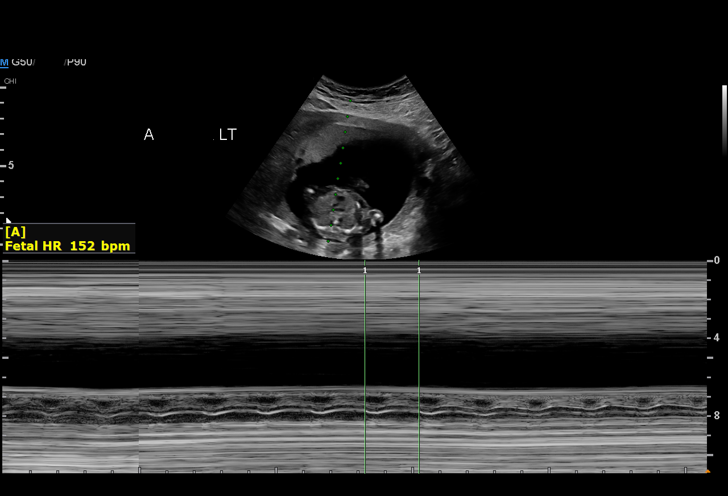
[im 52/83]
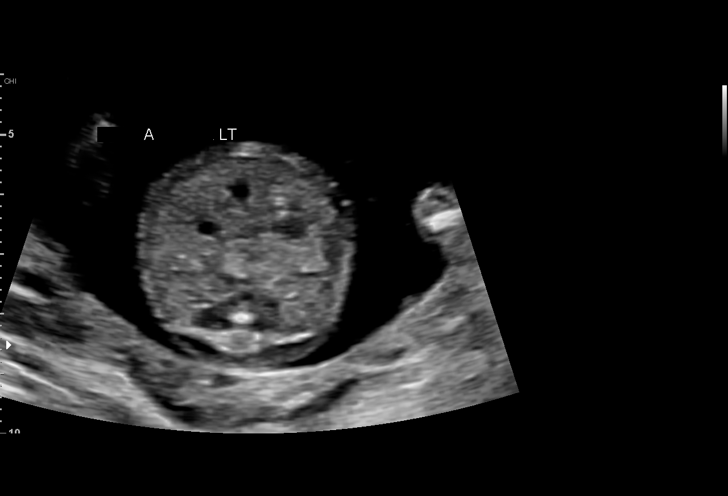
[im 58/83]
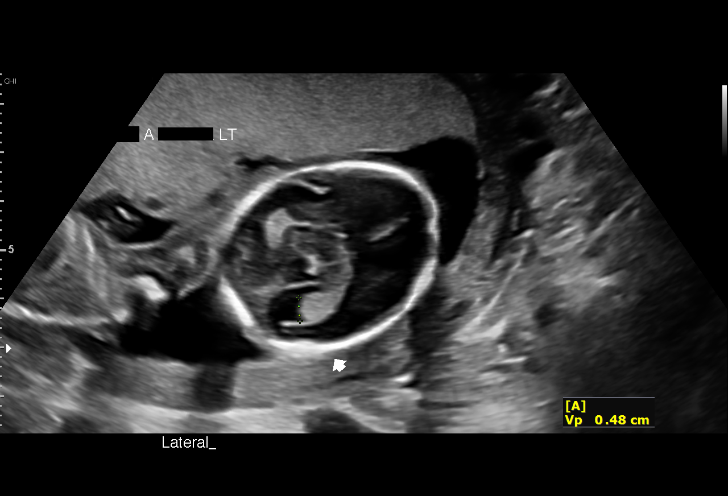
[im 64/83]
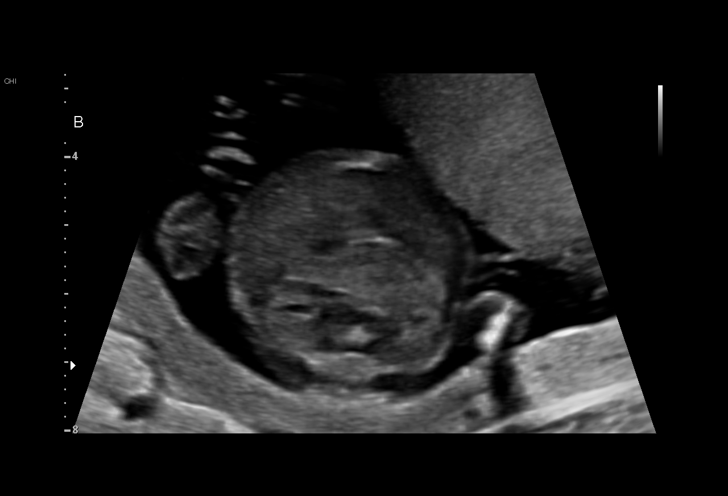
[im 70/83]
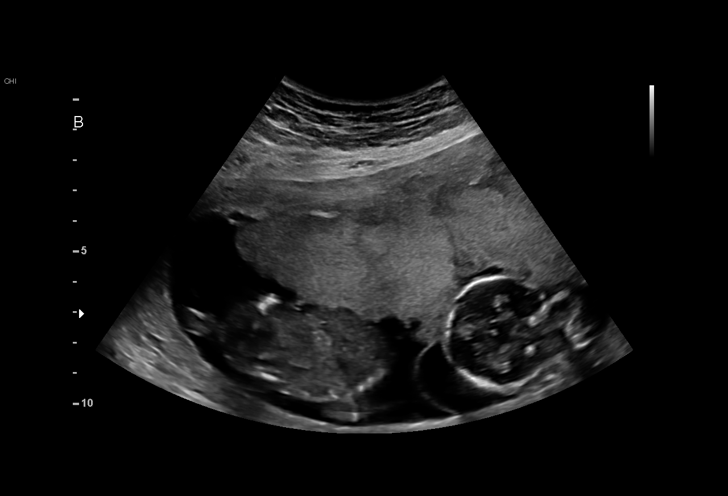
[im 76/83]
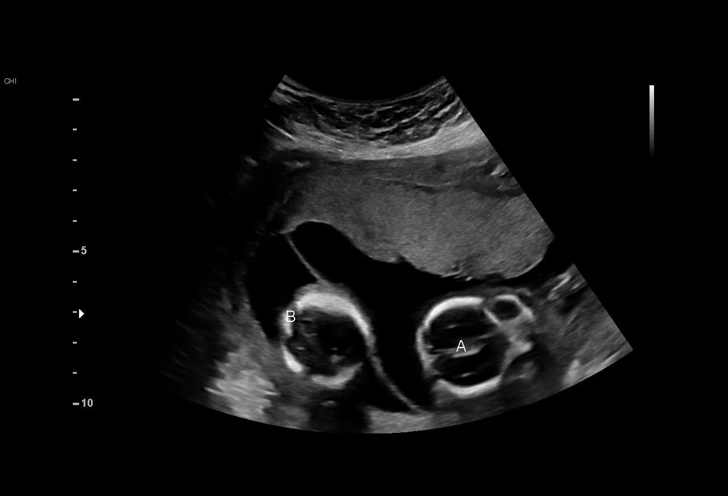
[im 83/83]
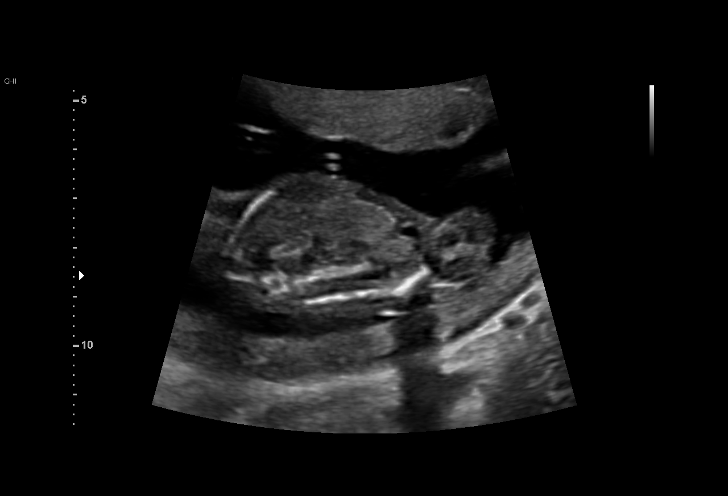

[14 of 28 positions shown; findings below may reference images not displayed]

[REDACTED]
                   19210

Indications

 16 weeks gestation of pregnancy
 Encounter for antenatal screening for
 malformations
 Twin pregnancy, di/di, first trimester
 Low Risk NIPS(Dizygotic Male and Female)
 Obesity complicating pregnancy, first
 trimester (Pregravid BMI 34)
 History of cervical cerclage, currently
 pregnant x 3
 Cervical insufficiency, 1st
Fetal Evaluation (Fetus A)

 Num Of Fetuses:         2
 Fetal Heart Rate(bpm):  152
 Cardiac Activity:       Observed
 Fetal Lie:              Lower Fetus; Maternal left
 Presentation:           Breech
 Placenta:               Anterior
 Membrane Desc:      Dividing Membrane seen - Dichorionic.
 Amniotic Fluid
 AFI FV:      Within normal limits

                             Largest Pocket(cm)

Biometry (Fetus A)

 LV:        4.8  mm
OB History

 Gravidity:    5         Term:   3        Prem:   0        SAB:   1
 TOP:          0       Ectopic:  0        Living: 3
Gestational Age (Fetus A)

 LMP:           16w 4d        Date:  06/28/20                 EDD:   04/04/21
 Best:          16w 4d     Det. By:  LMP  (06/28/20)          EDD:   04/04/21
Anatomy (Fetus A)

 Heart:                 Appears normal         Kidneys:                Appear normal
                        (4CH, axis, and
                        situs)
 Stomach:               Appears normal, left   Bladder:                Appears normal
                        sided

 Other:  Fetus appears to be a male.

Fetal Evaluation (Fetus B)

 Num Of Fetuses:         2
 Fetal Heart Rate(bpm):  167
 Cardiac Activity:       Observed
 Presentation:           Transverse, head to maternal left
 Placenta:               Anterior
 Membrane Desc:      Dividing Membrane seen - Dichorionic.

 Amniotic Fluid
 AFI FV:      Within normal limits

                             Largest Pocket(cm)

Gestational Age (Fetus B)

 LMP:           16w 4d        Date:  06/28/20                 EDD:   04/04/21
 Best:          16w 4d     Det. By:  LMP  (06/28/20)          EDD:   04/04/21
Anatomy (Fetus B)

 Stomach:               Appears normal, left   Bladder:                Appears normal
                        sided
 Kidneys:               Appear normal
Cervix Uterus Adnexa
 Cervix
 Length:           3.63  cm.
 Cerclage visualized.
Impression

 Dichorionic-diamniotic twin pregnancy with history of cervical
 incompetence.  Patient had prophylactic cerclage and return
 for transvaginal ultrasound evaluation.
 Amniotic fluid is normal and good fetal activity seen.  We
 performed transvaginal ultrasound.  The cervix measures
 cm, which is within normal limits.  A small funnel is seen.  The
 cerclage is in place.
 We reassured the patient of the findings.
Recommendations

 -Patient has an appointment for fetal anatomy scan.
                 Halcomb, Nusrat

## 2021-11-28 IMAGING — US US MFM OB LIMITED
1 of 2 series · 14 of 28 positions shown · non-contrast
Comparison: none

[Series 1: us mfm ob limited · 14 of 60 slices shown]
[im 1/60]
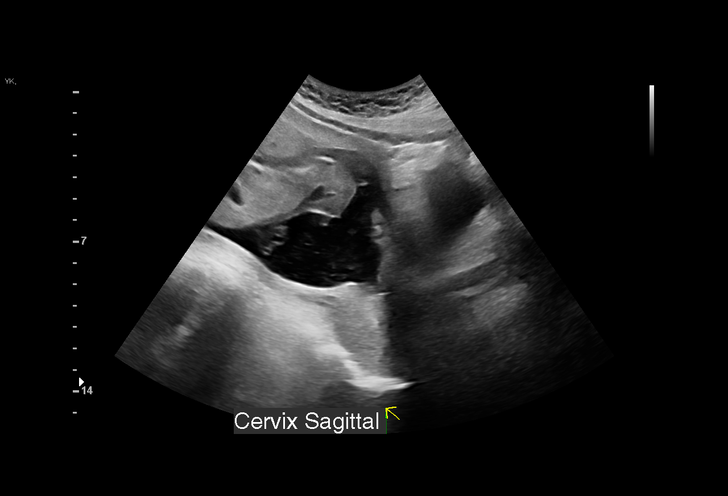
[im 5/60]
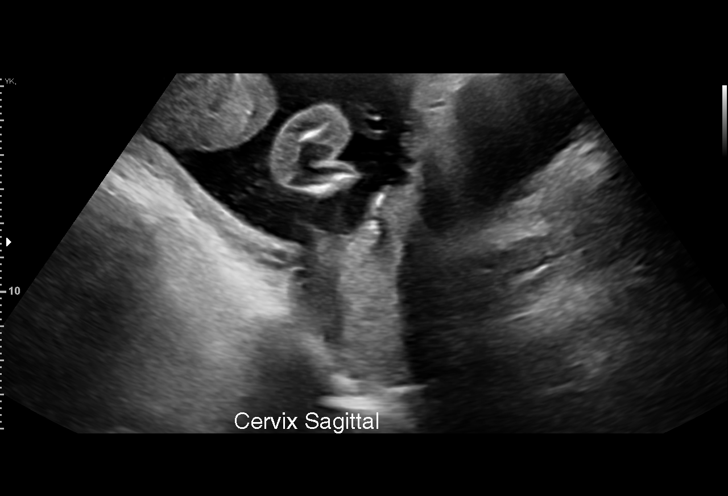
[im 10/60]
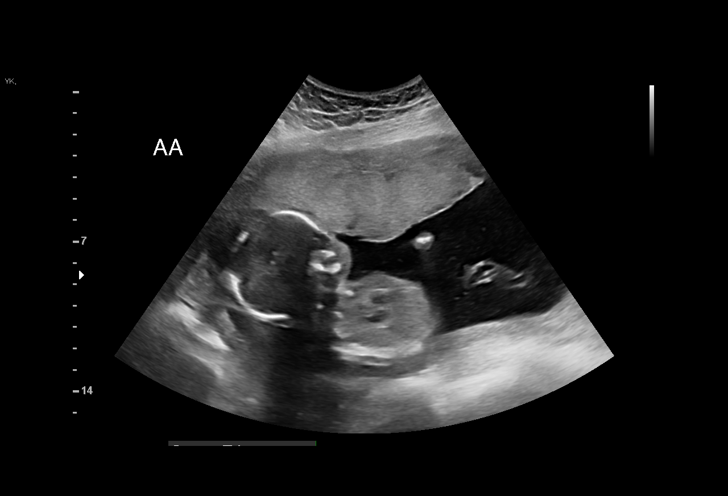
[im 14/60]
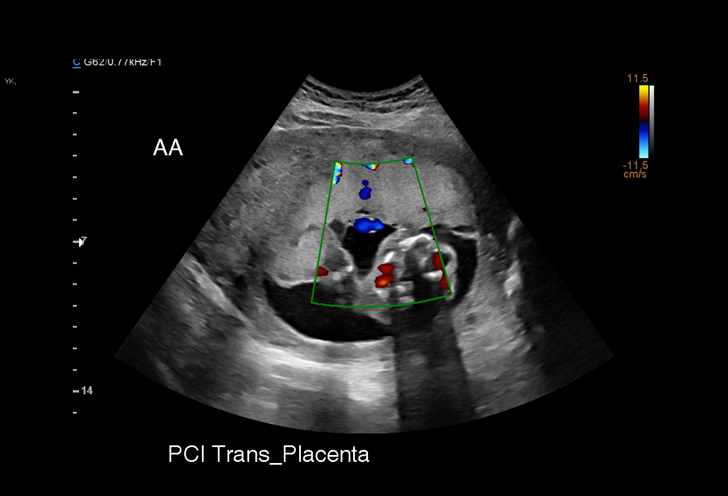
[im 19/60]
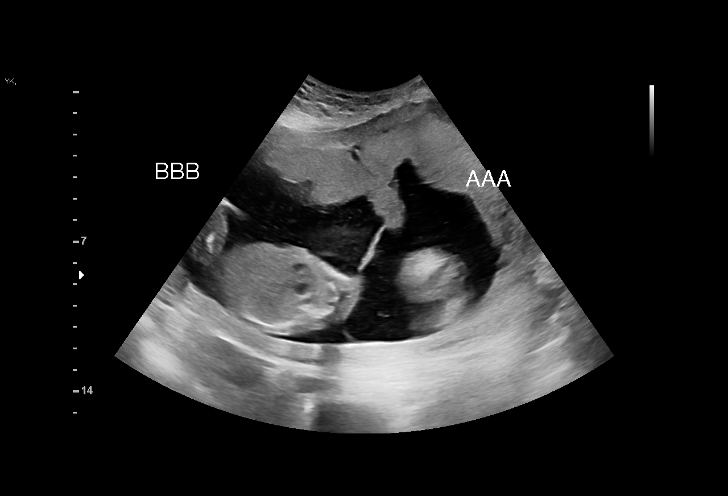
[im 23/60]
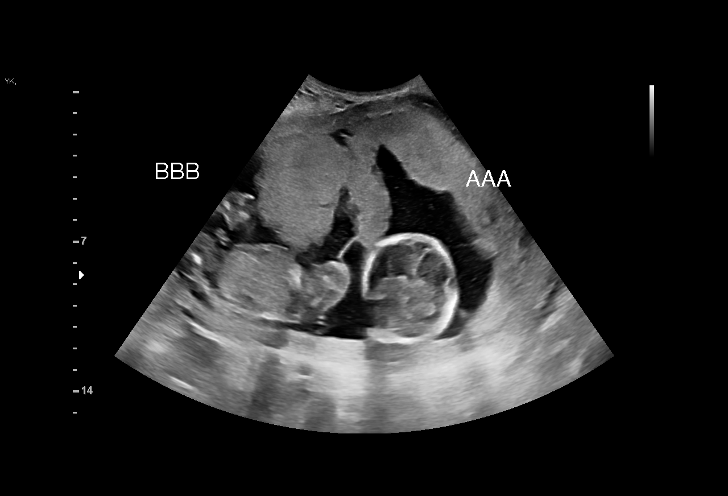
[im 28/60]
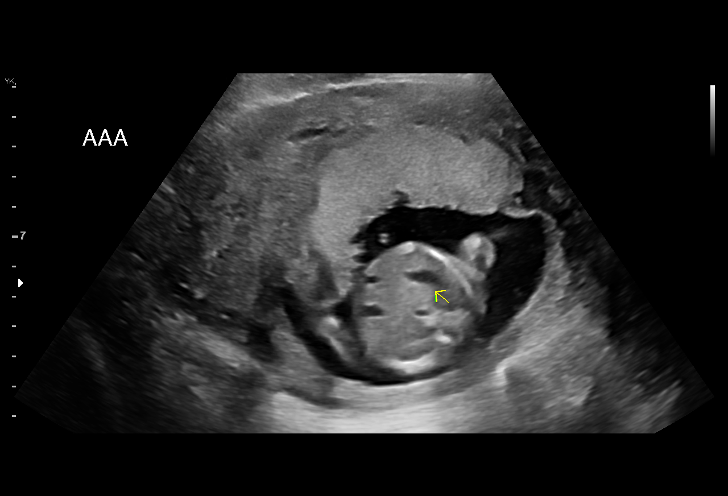
[im 32/60]
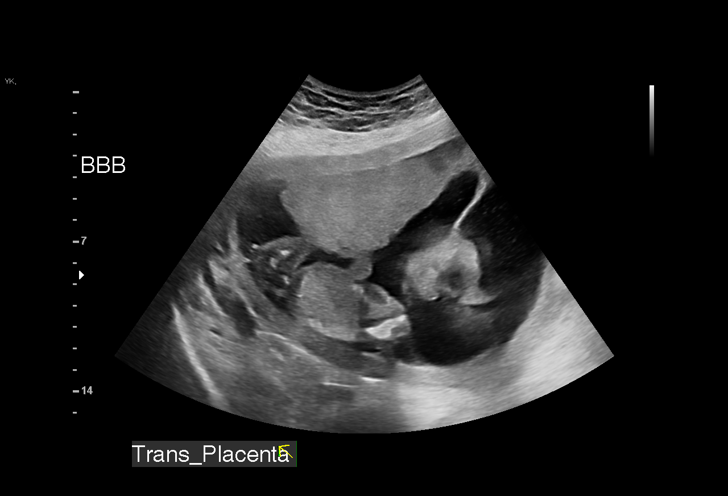
[im 37/60]
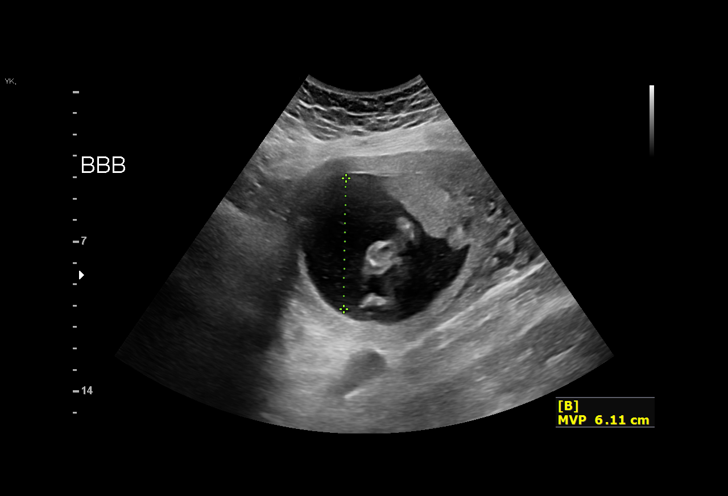
[im 41/60]
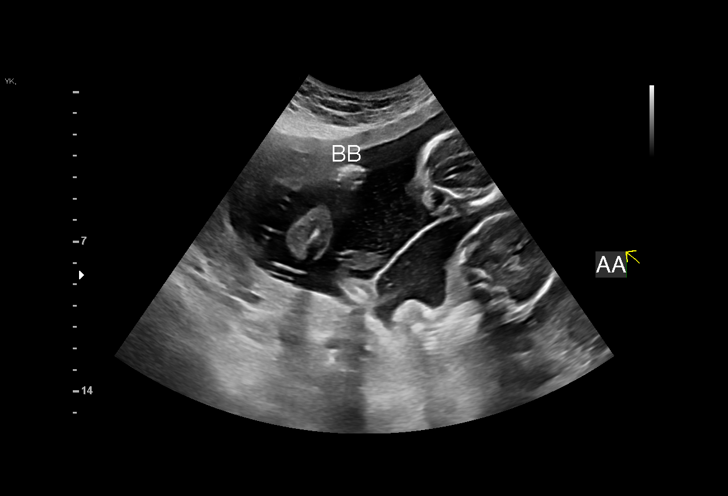
[im 46/60]
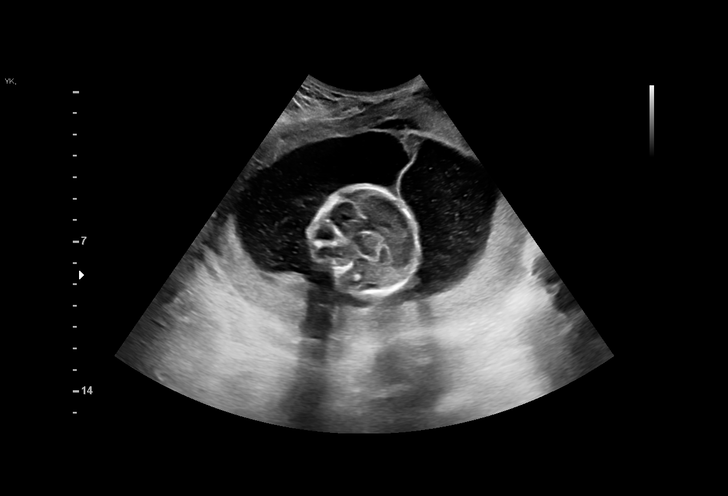
[im 50/60]
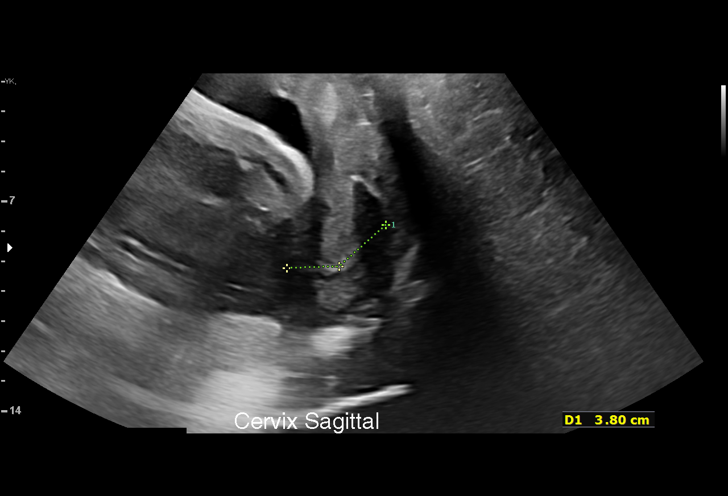
[im 55/60]
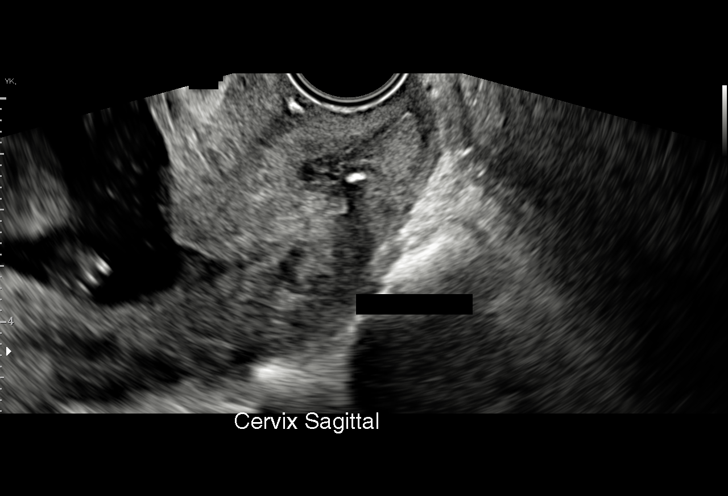
[im 60/60]
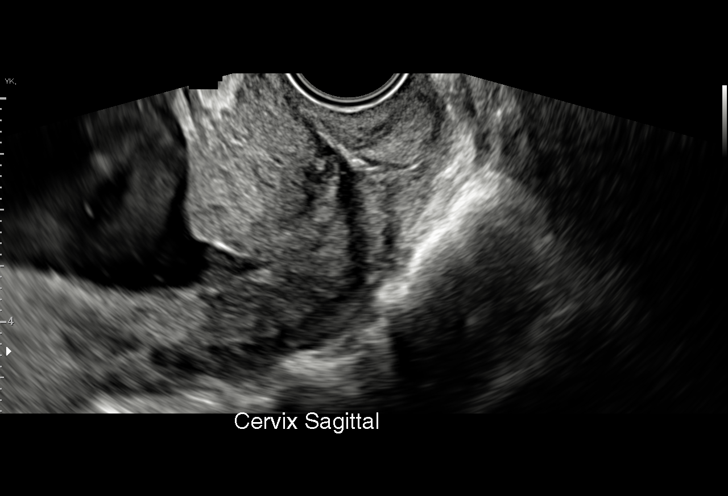

[14 of 28 positions shown; findings below may reference images not displayed]

[REDACTED]
                   08110

 1  US MFM OB LIMITED                     76815.01    DIONYSOS WARRAND
 2  US MFM OB TRANSVAGINAL                76817.2     DIONYSOS WARRAND

Indications

 Vaginal bleeding in pregnancy, second
 trimester
 Encounter for antenatal screening for
 malformations
 Twin pregnancy, di/di, first trimester
 Low Risk NIPS(Dizygotic Male and Female)
 Obesity complicating pregnancy, first
 trimester (Pregravid BMI 34)
 History of cervical cerclage, currently
 pregnant x 3
 Cervical insufficiency, 1st
 18 weeks gestation of pregnancy
Fetal Evaluation (Fetus A)

 Num Of Fetuses:          2
 Fetal Heart Rate(bpm):   160
 Cardiac Activity:        Observed
 Fetal Lie:               Lower Fetus; Maternal left
 Presentation:            Breech
 Placenta:                Anterior
 P. Cord Insertion:       Visualized, central
 Membrane Desc:      Dividing Membrane seen - Dichorionic.
 Amniotic Fluid
 AFI FV:      Within normal limits

                             Largest Pocket(cm)


 Comment:    No placental abruption or previa identified.
OB History

 Gravidity:    5         Term:   3        Prem:   0        SAB:   1
 TOP:          0       Ectopic:  0        Living: 3
Gestational Age (Fetus A)

 LMP:           18w 0d        Date:  06/28/20                 EDD:   04/04/21
 Best:          18w 0d     Det. By:  LMP  (06/28/20)          EDD:   04/04/21
Anatomy (Fetus A)

 Choroid Plexus:        Visualized             Kidneys:                Appear normal
 Posterior Fossa:       Visualized             Bladder:                Visualized
 Stomach:               Visualized

 Other:  Fetus appears to be a male.

Fetal Evaluation (Fetus B)

 Num Of Fetuses:          2
 Fetal Heart Rate(bpm):   162
 Cardiac Activity:        Observed
 Presentation:            Transverse, head to maternal left
 Placenta:                Anterior
 Membrane Desc:      Dividing Membrane seen - Dichorionic.

 Amniotic Fluid
 AFI FV:      Within normal limits

                             Largest Pocket(cm)


 Comment:    No placental abruption or previa identified.
Gestational Age (Fetus B)

 LMP:           18w 0d        Date:  06/28/20                 EDD:   04/04/21
 Best:          18w 0d     Det. By:  LMP  (06/28/20)          EDD:   04/04/21
Cervix Uterus Adnexa

 Cervix
 Length:            3.7  cm.
 Normal appearance by transvaginal scan, Cerclage visualized.

 Uterus
 No abnormality visualized.
Comments

 This patient presented to the [facility] due to vaginal spotting
 and back pain.

 A limited ultrasound performed today shows a dichorionic
 twin gestation.

 There was normal amniotic fluid noted around both fetuses.

 A transvaginal ultrasound performed today shows a cervical
 length of 3.7 cm long without any signs of funneling. The
 cerclage stitch was visualized.

## 2022-01-27 ENCOUNTER — Ambulatory Visit (INDEPENDENT_AMBULATORY_CARE_PROVIDER_SITE_OTHER): Payer: Medicaid Other | Admitting: Obstetrics & Gynecology

## 2022-01-27 ENCOUNTER — Encounter: Payer: Self-pay | Admitting: Obstetrics & Gynecology

## 2022-01-27 ENCOUNTER — Other Ambulatory Visit (HOSPITAL_COMMUNITY)
Admission: RE | Admit: 2022-01-27 | Discharge: 2022-01-27 | Disposition: A | Discharge status: Home / Self Care | Payer: Medicaid Other | Source: Ambulatory Visit | Attending: Obstetrics and Gynecology | Admitting: Obstetrics and Gynecology

## 2022-01-27 VITALS — BP 121/80 | HR 80 | Ht 69.0 in | Wt 243.7 lb

## 2022-01-27 DIAGNOSIS — Z975 Presence of (intrauterine) contraceptive device: Secondary | ICD-10-CM

## 2022-01-27 DIAGNOSIS — Z8742 Personal history of other diseases of the female genital tract: Secondary | ICD-10-CM | POA: Insufficient documentation

## 2022-01-27 DIAGNOSIS — Z3046 Encounter for surveillance of implantable subdermal contraceptive: Secondary | ICD-10-CM

## 2022-01-27 DIAGNOSIS — N921 Excessive and frequent menstruation with irregular cycle: Secondary | ICD-10-CM

## 2022-01-27 DIAGNOSIS — Z113 Encounter for screening for infections with a predominantly sexual mode of transmission: Secondary | ICD-10-CM

## 2022-01-27 LAB — POCT URINE PREGNANCY: Preg Test, Ur: NEGATIVE

## 2022-01-27 MED ORDER — PHEXXI 1.8-1-0.4 % VA GEL: VAGINAL | 11 refills | Status: DC

## 2022-01-27 NOTE — Patient Instructions (Addendum)
Phexxi  - new lubricant that works against sperm.  This was prescribed for you.

## 2022-01-27 NOTE — Progress Notes (Signed)
     GYNECOLOGY OFFICE PROCEDURE NOTE  Andrea Logan is a 36 y.o. [id number] here for Nexplanon removal due to persistent breakthrough bleeding and many other hormonal side effects.  Has used all the other hormonal modalities with various issues.  Last pap smear was on 07/13/2021 and was normal.  She also desires UPT and STI screen.  No other gynecologic concerns.  Nexplanon Removal Patient identified, informed consent performed, consent signed.   Appropriate time out taken. Nexplanon site identified.  Area prepped in usual sterile fashon. One ml of 1% lidocaine was used to anesthetize the area at the distal end of the implant. A Stamp stab incision was made right beside the implant on the distal portion.  The Nexplanon rod was grasped using hemostats and removed without difficulty.  There was minimal blood loss. There were no complications.  3 ml of 1% lidocaine was injected around the incision for post-procedure analgesia.  Steri-strips were applied over the Rodrigue incision.  A pressure bandage was applied to reduce any bruising.  The patient tolerated the procedure well and was given post procedure instructions.  Patient is planning to use condoms and rhythm for contraception; also given information about Phexxi and this was prescribed for her.   Jaynie Collins, MD, FACOG Obstetrician & Gynecologist, [employer] [facility] for [employer], [facility]

## 2022-01-28 LAB — RPR+HBSAG+HCVAB+...
HIV Screen 4th Generation wRfx: NONREACTIVE
Hep C Virus Ab: NONREACTIVE
Hepatitis B Surface Ag: NEGATIVE
RPR Ser Ql: NONREACTIVE

## 2022-01-29 LAB — CERVICOVAGINAL ANCILLARY ONLY
Bacterial Vaginitis-Urine: NEGATIVE
Candida Urine: NEGATIVE
Chlamydia: NEGATIVE
Comment: NEGATIVE
Comment: NEGATIVE
Comment: NORMAL
Neisseria Gonorrhea: NEGATIVE
Trichomonas: NEGATIVE

## 2022-02-24 ENCOUNTER — Ambulatory Visit: Payer: Medicaid Other | Admitting: Obstetrics and Gynecology

## 2022-05-17 NOTE — L&D Delivery Note (Addendum)
Attestation of Supervision of Student:  I confirm that I have verified the information documented in the medical resident's note and that I have also personally supervised the history, physical exam and all medical decision making activities.  I have verified that all services and findings are accurately documented in this student's note; and I agree with management and plan as outlined in the documentation. I have also made any necessary editorial changes.  Richardson Landry, CNM Center for [employer], [facility] 05/08/2023 6:52 PM   OB/GYN Faculty Practice Delivery Note  Andrea Logan is a 37 y.o. [id number] s/p VBAC at [redacted]w[redacted]d. She was admitted for spontaneous labor.   ROM: 0h 43m with mec-stained fluid GBS Status:  Negative/-- (12/10 1348) Maximum Maternal Temperature: Temp (24hrs), Avg:98 F (36.7 C), Min:98 F (36.7 C), Max:98 F (36.7 C)   Labor Progress: Initial SVE: 7.5/90/0. AROM at 1445 with light mec-stained fluid. She then progressed to complete.  Delivery Date/Time: 05/08/2023 1506 Delivery: Called to room and patient was complete and pushing. Head delivered LOA. No nuchal cord present. Shoulder and body delivered in usual fashion. Infant with spontaneous cry, placed on mother's abdomen, dried and stimulated. Cord clamped x 2 after 1-minute delay, and cut by patient. Cord blood drawn. Labia, perineum, vagina, and cervix inspected, notable for R labial 1st degree laceration, repaired before delivery of placenta. Placenta retained for >30 minutes. Called Dr. Despina Hidden to room to assess. Placenta delivered with intra-umbilical vein injection of oxytocin 100 mcg by Dr. Despina Hidden within 5 minutes of injection. Fundus firm with massage and Pitocin. Reassessment of laceration noted to remain hemostatic.   Baby Weight: pending  Placenta: 3 vessel, intact. Sent to L&D Complications: Retained placenta, delivered with pharmacotherapy Lacerations: R labial/periurethral 1st  degree, repaired in usual fashion EBL: 152 mL Analgesia: Received epidural late; local anesthetic for laceration repair  Infant:  APGAR (1 MIN): 9  APGAR (5 MINS): 10   Andrea Brunner, MD 05/08/2023, 4:06 PM

## 2022-06-21 ENCOUNTER — Other Ambulatory Visit (HOSPITAL_COMMUNITY)
Admission: RE | Admit: 2022-06-21 | Discharge: 2022-06-21 | Disposition: A | Discharge status: Home / Self Care | Payer: Medicaid Other | Source: Ambulatory Visit | Attending: Obstetrics and Gynecology | Admitting: Obstetrics and Gynecology

## 2022-06-21 ENCOUNTER — Ambulatory Visit (INDEPENDENT_AMBULATORY_CARE_PROVIDER_SITE_OTHER): Payer: Medicaid Other

## 2022-06-21 VITALS — BP 106/66 | HR 78 | Wt 242.3 lb

## 2022-06-21 DIAGNOSIS — Z113 Encounter for screening for infections with a predominantly sexual mode of transmission: Secondary | ICD-10-CM | POA: Insufficient documentation

## 2022-06-21 NOTE — Progress Notes (Signed)
Patient presents for STD screening and blood work. Patient denies having any vaginal discharge, odor, or irritation at this time. No other concerns. AEX scheduled for 2/19.

## 2022-06-22 LAB — CERVICOVAGINAL ANCILLARY ONLY
Chlamydia: NEGATIVE
Comment: NEGATIVE
Comment: NEGATIVE
Comment: NORMAL
Neisseria Gonorrhea: POSITIVE — AB
Trichomonas: NEGATIVE

## 2022-06-23 ENCOUNTER — Ambulatory Visit (INDEPENDENT_AMBULATORY_CARE_PROVIDER_SITE_OTHER): Payer: Medicaid Other | Admitting: Licensed Clinical Social Worker

## 2022-06-23 ENCOUNTER — Ambulatory Visit (INDEPENDENT_AMBULATORY_CARE_PROVIDER_SITE_OTHER): Payer: Medicaid Other

## 2022-06-23 VITALS — BP 116/75 | HR 89 | Wt 241.8 lb

## 2022-06-23 DIAGNOSIS — F321 Major depressive disorder, single episode, moderate: Secondary | ICD-10-CM | POA: Diagnosis not present

## 2022-06-23 DIAGNOSIS — A549 Gonococcal infection, unspecified: Secondary | ICD-10-CM

## 2022-06-23 DIAGNOSIS — A5402 Gonococcal vulvovaginitis, unspecified: Secondary | ICD-10-CM | POA: Diagnosis not present

## 2022-06-23 MED ORDER — CEFTRIAXONE SODIUM 500 MG IJ SOLR
500.0000 mg | Freq: Once | INTRAMUSCULAR | Status: AC
  Administered 2022-06-23: 500 mg via INTRAMUSCULAR

## 2022-06-23 NOTE — Progress Notes (Signed)
Patient presents for a rocephin inj for positive gonorrhea on 2/5.  500 mg rocephin reconstituted with 109ml 1% lidocaine. Inj given in [place]. Patient tolerated well.

## 2022-06-23 NOTE — BH Specialist Note (Signed)
Integrated Behavioral Health Follow Up In-Person Visit  MRN: [id number] Name: Andrea Logan  Number of [place] Clinician visits: 5 Session Start time:  9:30am Session End time: 10:16am Total time in minutes: 46 mins in person at [facility]   Types of Service: Individual psychotherapy  Interpretor:No. Interpretor Name and Language: none  Subjective: Andrea Logan is a 37 y.o. female accompanied by n/a Patient was referred by Self referral for depression. Patient reports the following symptoms/concerns: depressed mood, low self-confidence, angry, and no support  Duration of problem: over one year; Severity of problem: mild  Objective: Mood: Depressed and Affect: Appropriate Risk of harm to self or others: No plan to harm self or others  Life Context: Family and Social: Lives in [place] with children  School/Work: Work at home  Self-Care: n/a Life Changes: relationship conflict   Patient and/or Family's Strengths/Protective Factors: Concrete supports in place (healthy food, safe environments, etc.)  Goals Addressed: Patient will:  Reduce symptoms of: depression   Increase knowledge and/or ability of: coping skills   Demonstrate ability to: Increase healthy adjustment to current life circumstances  Progress towards Goals: Ongoing  Interventions: Interventions utilized:  Motivational Interviewing and Supportive Counseling Standardized Assessments completed: Not Needed  Patient and/or Family Response: Ms. Zobrist reports increased depressed mood and anger upon notification of recent self swab results.   Assessment: Patient currently experiencing depressive disorder.   Patient may benefit from integrated behavioral health.  Plan: Follow up with behavioral health clinician on : as needed Behavioral recommendations: Positive self affirmations, create and exercise boundaries, and create smart goals  Referral(s): [place] (In  Clinic) "From scale of 1-10, how likely are you to follow plan?":    Lynnea Ferrier, LCSW

## 2022-06-24 ENCOUNTER — Other Ambulatory Visit: Payer: Self-pay | Admitting: *Deleted

## 2022-06-24 NOTE — Progress Notes (Signed)
[facility] STI form faxed.

## 2022-06-25 LAB — RPR: RPR Ser Ql: NONREACTIVE

## 2022-06-25 LAB — HEPATITIS B SURFACE ANTIGEN: Hepatitis B Surface Ag: NEGATIVE

## 2022-06-25 LAB — HEPATITIS C ANTIBODY: Hep C Virus Ab: NONREACTIVE

## 2022-06-25 LAB — HIV ANTIBODY (ROUTINE TESTING W REFLEX): HIV Screen 4th Generation wRfx: NONREACTIVE

## 2022-06-29 NOTE — Progress Notes (Signed)
Patient was assessed and managed by nursing staff during this encounter. I have reviewed the chart and agree with the documentation and plan. I have also made any necessary editorial changes.  Mora Bellman, MD 06/29/2022 9:24 AM

## 2022-07-05 ENCOUNTER — Encounter: Payer: Self-pay | Admitting: Obstetrics and Gynecology

## 2022-07-05 ENCOUNTER — Other Ambulatory Visit (HOSPITAL_COMMUNITY)
Admission: RE | Admit: 2022-07-05 | Discharge: 2022-07-05 | Disposition: A | Discharge status: Home / Self Care | Payer: Medicaid Other | Source: Ambulatory Visit | Attending: Obstetrics and Gynecology | Admitting: Obstetrics and Gynecology

## 2022-07-05 ENCOUNTER — Ambulatory Visit: Payer: Medicaid Other | Admitting: Obstetrics and Gynecology

## 2022-07-05 VITALS — BP 103/69 | HR 80 | Ht 69.0 in | Wt 241.0 lb

## 2022-07-05 DIAGNOSIS — Z6835 Body mass index (BMI) 35.0-35.9, adult: Secondary | ICD-10-CM | POA: Diagnosis not present

## 2022-07-05 DIAGNOSIS — Z01419 Encounter for gynecological examination (general) (routine) without abnormal findings: Secondary | ICD-10-CM | POA: Diagnosis not present

## 2022-07-05 NOTE — Progress Notes (Signed)
Pt is concerned about her weight. Pt recently treated for GC, has f/u appt 07/21/22. Pt would like pap today, maternal aunt recently dx with cervical cancer.

## 2022-07-05 NOTE — Progress Notes (Signed)
GYNECOLOGY ANNUAL PREVENTATIVE CARE ENCOUNTER NOTE  History:     Andrea Logan is a 37 y.o. [id number] female here for a routine annual gynecologic exam.  Current complaints: weight gain/ inability to lose weight.   Denies abnormal vaginal bleeding, discharge, pelvic pain, problems with intercourse or other gynecologic concerns.    Gynecologic History Patient's last menstrual period was 06/24/2022. Contraception: none Last Pap: 07/13/21. Results were: normal with negative HPV   Obstetric History OB History  Gravida Para Term Preterm AB Living  5 4 4   1 5  $ SAB IAB Ectopic Multiple Live Births  1     1 5    $ # Outcome Date GA Lbr Len/2nd Weight Sex Delivery Anes PTL Lv  5A Term 03/16/21 19w2d04:24 / 00:05 6 lb 8.1 oz (2.95 kg) M Vag-Spont EPI  LIV  5B Term 03/16/21 353w2d4:24 / 00:21 4 lb 13.3 oz (2.19 kg) F CS-LTranv EPI  LIV  4 Term 11/25/13 4031w0d lb 10 oz (3.459 kg) M Vag-Spont   LIV  3 Term 04/20/12 40w52w0dlb 8 oz (3.402 kg) M Vag-Spont  Y LIV  2 Term 06/20/10 40w578w5db 14 oz (4.479 kg) F Vag-Spont   LIV  1 SAB 02/26/10 50w0d72w0d     Past Medical History:  Diagnosis Date   Alpha thalassemia silent carrier 10/14/2020   Anemia    Migraine    Trichomonal vaginitis 07/13/2021   Vaginal Pap smear, abnormal     Past Surgical History:  Procedure Laterality Date   CERVICAL CERCLAGE     CERVICAL CERCLAGE N/A 10/08/2020   Procedure: CERCLAGE CERVICAL;  Surgeon: ArnoldWoodroe Mode Location: [facility] LD ORS;  Service: Gynecology;  Laterality: N/A;   CERVICAL CONE BIOPSY     CESAREAN SECTION N/A 03/16/2021   Procedure: CESAREAN SECTION;  Surgeon: EckstaClarnce Flock Location: [facility] LD ORS;  Service: Obstetrics;  Laterality: N/A;    Current Outpatient Medications on File Prior to Visit  Medication Sig Dispense Refill   Lactic Ac-Citric Ac-Pot Bitart (PHEXXI) 1.8-1-0.4 % GEL Administer 1 applicator (5 g) intravaginally immediately before or up to 1 hour before EACH [facility] as needed. 60 g 11   No current facility-administered medications on file prior to visit.    No Known Allergies  Social History:  reports that she has never smoked. She has never used smokeless tobacco. She reports that she does not drink alcohol and does not use drugs.  Family History  Problem Relation Age of Onset   Hypertension Mother    Diabetes Mother    Cervical cancer Maternal Aunt     The following portions of the patient's history were reviewed and updated as appropriate: allergies, current medications, past family history, past medical history, past social history, past surgical history and problem list.  Review of Systems Pertinent items noted in HPI and remainder of comprehensive ROS otherwise negative.  Physical Exam:  BP 103/69   Pulse 80   Ht 5' 9"$  (1.753 m)   Wt 241 lb (109.3 kg)   LMP 06/24/2022   BMI 35.59 kg/m  CONSTITUTIONAL: Well-developed, well-nourished female in no acute distress.  HENT:  Normocephalic, atraumatic, External right and left ear normal. Oropharynx is clear and moist EYES: Conjunctivae and EOM are normal.  NECK: Normal range of motion, supple, no masses.  Normal thyroid.  SKIN: Skin is warm and dry. No  rash noted. Not diaphoretic. No erythema. No pallor. MUSCULOSKELETAL: Normal range of motion. No tenderness.  No cyanosis, clubbing, or edema.  2+ distal pulses. NEUROLOGIC: Alert and oriented to person, place, and time. Normal reflexes, muscle tone coordination.  PSYCHIATRIC: Normal mood and affect. Normal behavior. Normal judgment and thought content. CARDIOVASCULAR: Normal heart rate noted, regular rhythm RESPIRATORY: Clear to auscultation bilaterally. Effort and breath sounds normal, no problems with respiration noted. BREASTS: Symmetric in size. No masses, tenderness, skin changes, nipple drainage, or lymphadenopathy bilaterally. Performed in the presence of a chaperone.  Piercing noted ABDOMEN: Soft, no distention  noted.  No tenderness, rebound or guarding.  PELVIC: Normal appearing external genitalia and urethral meatus; normal appearing vaginal mucosa and cervix.  No abnormal discharge noted.  Pap smear obtained.  Normal uterine size, no other palpable masses, no uterine or adnexal tenderness. Tubular vaginal tissues resembling a tentacleon the pt's right side noted Performed in the presence of a chaperone.   Assessment and Plan:    1. Women's annual routine gynecological examination Normal annual exam - Cytology - PAP( [place])  2. BMI 35.0-35.9,adult Discussed weight loss strategies.  The patient already exercises 4 days a week for about an hour per session. She states she has also modified her diet.  Discussed possible use of GLP-1 injectables.  Discussion on price, side effects and need for continued therapy discussed, will refer to weight loss clinic for eval.  Pt also advised she may need to look into a family medicine or internal medicine physician for this therapy.  - Amb ref to Medical Nutrition Therapy-MNT  Will follow up results of pap smear and manage accordingly. Routine preventative health maintenance measures emphasized. Please refer to After Visit Summary for other counseling recommendations.      Lynnda Shields, MD, [place] for [facility], [place]

## 2022-07-06 ENCOUNTER — Encounter: Payer: Self-pay | Admitting: Obstetrics and Gynecology

## 2022-07-06 DIAGNOSIS — E6609 Other obesity due to excess calories: Secondary | ICD-10-CM | POA: Insufficient documentation

## 2022-07-10 LAB — CYTOLOGY - PAP
Comment: NEGATIVE
Diagnosis: UNDETERMINED — AB
High risk HPV: NEGATIVE

## 2022-07-12 ENCOUNTER — Other Ambulatory Visit: Payer: Self-pay | Admitting: Obstetrics and Gynecology

## 2022-07-12 DIAGNOSIS — Z6836 Body mass index (BMI) 36.0-36.9, adult: Secondary | ICD-10-CM | POA: Diagnosis not present

## 2022-07-12 DIAGNOSIS — D500 Iron deficiency anemia secondary to blood loss (chronic): Secondary | ICD-10-CM | POA: Diagnosis not present

## 2022-07-12 DIAGNOSIS — Z130 Encounter for screening for diseases of the blood and blood-forming organs and certain disorders involving the immune mechanism: Secondary | ICD-10-CM | POA: Diagnosis not present

## 2022-07-12 DIAGNOSIS — Z0000 Encounter for general adult medical examination without abnormal findings: Secondary | ICD-10-CM | POA: Diagnosis not present

## 2022-07-12 DIAGNOSIS — E6609 Other obesity due to excess calories: Secondary | ICD-10-CM

## 2022-07-12 DIAGNOSIS — Z13220 Encounter for screening for lipoid disorders: Secondary | ICD-10-CM | POA: Diagnosis not present

## 2022-07-12 DIAGNOSIS — R635 Abnormal weight gain: Secondary | ICD-10-CM | POA: Diagnosis not present

## 2022-07-15 DIAGNOSIS — Z6836 Body mass index (BMI) 36.0-36.9, adult: Secondary | ICD-10-CM | POA: Diagnosis not present

## 2022-07-15 DIAGNOSIS — R635 Abnormal weight gain: Secondary | ICD-10-CM | POA: Diagnosis not present

## 2022-07-15 DIAGNOSIS — D500 Iron deficiency anemia secondary to blood loss (chronic): Secondary | ICD-10-CM | POA: Diagnosis not present

## 2022-07-15 DIAGNOSIS — Z130 Encounter for screening for diseases of the blood and blood-forming organs and certain disorders involving the immune mechanism: Secondary | ICD-10-CM | POA: Diagnosis not present

## 2022-07-15 DIAGNOSIS — Z13220 Encounter for screening for lipoid disorders: Secondary | ICD-10-CM | POA: Diagnosis not present

## 2022-07-21 ENCOUNTER — Ambulatory Visit (INDEPENDENT_AMBULATORY_CARE_PROVIDER_SITE_OTHER): Payer: Medicaid Other

## 2022-07-21 ENCOUNTER — Other Ambulatory Visit (HOSPITAL_COMMUNITY)
Admission: RE | Admit: 2022-07-21 | Discharge: 2022-07-21 | Disposition: A | Discharge status: Home / Self Care | Payer: Medicaid Other | Source: Ambulatory Visit | Attending: Obstetrics and Gynecology | Admitting: Obstetrics and Gynecology

## 2022-07-21 DIAGNOSIS — Z8619 Personal history of other infectious and parasitic diseases: Secondary | ICD-10-CM | POA: Insufficient documentation

## 2022-07-21 LAB — CERVICOVAGINAL ANCILLARY ONLY
Bacterial Vaginitis (gardnerella): POSITIVE — AB
Candida Glabrata: NEGATIVE
Candida Vaginitis: NEGATIVE
Chlamydia: NEGATIVE
Comment: NEGATIVE
Comment: NEGATIVE
Comment: NEGATIVE
Comment: NEGATIVE
Comment: NEGATIVE
Comment: NORMAL
Neisseria Gonorrhea: NEGATIVE
Trichomonas: NEGATIVE

## 2022-07-21 NOTE — Progress Notes (Signed)
..  SUBJECTIVE:  37 y.o. female is in the office for [facility] for +GC on 06/21/2022. Denies abnormal vaginal bleeding, discharge, or significant pelvic pain or fever. No UTI symptoms. Denies history of known exposure to STD.  Patient's last menstrual period was 06/24/2022.  OBJECTIVE:  She appears well, afebrile. Urine dipstick: not done.  ASSESSMENT:  Test of Cure Rocephin administered on 06/23/2022   PLAN:  GC, chlamydia, trichomonas, BVAG, CVAG probe sent to lab. Treatment: To be determined once lab results are received ROV prn if symptoms persist or worsen.

## 2022-07-22 ENCOUNTER — Other Ambulatory Visit: Payer: Self-pay

## 2022-07-22 DIAGNOSIS — N760 Acute vaginitis: Secondary | ICD-10-CM

## 2022-07-22 MED ORDER — METRONIDAZOLE 500 MG PO TABS: 500.0000 mg | ORAL_TABLET | Freq: Two times a day (BID) | ORAL | 0 refills | Status: DC

## 2022-09-22 ENCOUNTER — Ambulatory Visit (INDEPENDENT_AMBULATORY_CARE_PROVIDER_SITE_OTHER): Payer: Medicaid Other | Admitting: General Practice

## 2022-09-22 VITALS — BP 111/72 | HR 77 | Ht 69.0 in | Wt 238.4 lb

## 2022-09-22 DIAGNOSIS — Z3480 Encounter for supervision of other normal pregnancy, unspecified trimester: Secondary | ICD-10-CM

## 2022-09-22 DIAGNOSIS — O0990 Supervision of high risk pregnancy, unspecified, unspecified trimester: Secondary | ICD-10-CM | POA: Insufficient documentation

## 2022-09-22 DIAGNOSIS — N912 Amenorrhea, unspecified: Secondary | ICD-10-CM

## 2022-09-22 DIAGNOSIS — Z3201 Encounter for pregnancy test, result positive: Secondary | ICD-10-CM

## 2022-09-22 DIAGNOSIS — Z3200 Encounter for pregnancy test, result unknown: Secondary | ICD-10-CM

## 2022-09-22 LAB — POCT URINE PREGNANCY: Preg Test, Ur: POSITIVE — AB

## 2022-09-22 NOTE — Progress Notes (Signed)
Andrea Logan presents today for UPT. Pt c/o spotting for 3-4 days.  LMP: 08-10-22     OBJECTIVE: Appears well, in no apparent distress.  OB History     Gravida  5   Para  4   Term  4   Preterm      AB  1   Living  5      SAB  1   IAB      Ectopic      Multiple  1   Live Births  5          Home UPT Result: Positive In-Office UPT result:Positive I have reviewed the patient's medical, obstetrical, social, and family histories, and medications.   ASSESSMENT: Positive pregnancy test  PLAN Prenatal care to be completed at: [facility]

## 2022-10-05 ENCOUNTER — Other Ambulatory Visit (HOSPITAL_COMMUNITY)
Admission: RE | Admit: 2022-10-05 | Discharge: 2022-10-05 | Disposition: A | Discharge status: Home / Self Care | Payer: Medicaid Other | Source: Ambulatory Visit | Attending: Obstetrics and Gynecology | Admitting: Obstetrics and Gynecology

## 2022-10-05 ENCOUNTER — Ambulatory Visit (INDEPENDENT_AMBULATORY_CARE_PROVIDER_SITE_OTHER): Payer: Medicaid Other | Admitting: *Deleted

## 2022-10-05 VITALS — BP 109/74 | HR 82

## 2022-10-05 DIAGNOSIS — Z3480 Encounter for supervision of other normal pregnancy, unspecified trimester: Secondary | ICD-10-CM | POA: Diagnosis not present

## 2022-10-05 DIAGNOSIS — Z3481 Encounter for supervision of other normal pregnancy, first trimester: Secondary | ICD-10-CM | POA: Diagnosis not present

## 2022-10-05 DIAGNOSIS — Z3A08 8 weeks gestation of pregnancy: Secondary | ICD-10-CM | POA: Diagnosis not present

## 2022-10-05 NOTE — Progress Notes (Signed)
SUBJECTIVE:  37 y.o. female who desires a STI screen. Denies abnormal vaginal discharge, bleeding or significant pelvic pain. No UTI symptoms. Denies history of known exposure to STD. Pt is [redacted] wks GA.  Patient's last menstrual period was 08/10/2022.  OBJECTIVE:  She appears well.   ASSESSMENT:  STI Screen   PLAN:  Pt offered STI blood screening-requested GC, chlamydia, and trichomonas probe sent to lab.  Treatment: To be determined once lab results are received.  Pt follow up as needed.  Pt is [redacted] wks GA and reports light, intermittent spotting. Had abdominal pain yesterday, but none today. Advised pt to seek care in MAU should VB become heavier or pain worsen or for pain that is one sided. Pt verbalized understanding. New OB labs, urine, GC/CC collected.

## 2022-10-06 LAB — HEMOGLOBIN A1C
Est. average glucose Bld gHb Est-mCnc: 105 mg/dL
Hgb A1c MFr Bld: 5.3 % (ref 4.8–5.6)

## 2022-10-06 LAB — CBC/D/PLT+RPR+RH+ABO+RUBIGG...
Antibody Screen: NEGATIVE
Basophils Absolute: 0 10*3/uL (ref 0.0–0.2)
Basos: 0 %
EOS (ABSOLUTE): 0.1 10*3/uL (ref 0.0–0.4)
Eos: 1 %
HCV Ab: NONREACTIVE
HIV Screen 4th Generation wRfx: NONREACTIVE
Hematocrit: 37 % (ref 34.0–46.6)
Hemoglobin: 12 g/dL (ref 11.1–15.9)
Hepatitis B Surface Ag: NEGATIVE
Immature Grans (Abs): 0 10*3/uL (ref 0.0–0.1)
Immature Granulocytes: 0 %
Lymphocytes Absolute: 2 10*3/uL (ref 0.7–3.1)
Lymphs: 21 %
MCH: 26.9 pg (ref 26.6–33.0)
MCHC: 32.4 g/dL (ref 31.5–35.7)
MCV: 83 fL (ref 79–97)
Monocytes Absolute: 0.5 10*3/uL (ref 0.1–0.9)
Monocytes: 6 %
Neutrophils Absolute: 6.6 10*3/uL (ref 1.4–7.0)
Neutrophils: 72 %
Platelets: 314 10*3/uL (ref 150–450)
RBC: 4.46 x10E6/uL (ref 3.77–5.28)
RDW: 15.6 % — ABNORMAL HIGH (ref 11.7–15.4)
RPR Ser Ql: NONREACTIVE
Rh Factor: POSITIVE
Rubella Antibodies, IGG: 3.2 index (ref >0.99)
WBC: 9.2 10*3/uL (ref 3.4–10.8)

## 2022-10-06 LAB — HCV INTERPRETATION

## 2022-10-07 LAB — CULTURE, OB URINE

## 2022-10-07 LAB — URINE CULTURE, OB REFLEX: Organism ID, Bacteria: NO GROWTH

## 2022-10-08 LAB — CERVICOVAGINAL ANCILLARY ONLY
Bacterial Vaginitis (gardnerella): POSITIVE — AB
Candida Glabrata: NEGATIVE
Candida Vaginitis: NEGATIVE
Chlamydia: NEGATIVE
Comment: NEGATIVE
Comment: NEGATIVE
Comment: NEGATIVE
Comment: NEGATIVE
Comment: NEGATIVE
Comment: NORMAL
Neisseria Gonorrhea: NEGATIVE
Trichomonas: NEGATIVE

## 2022-10-12 ENCOUNTER — Other Ambulatory Visit: Payer: Self-pay

## 2022-10-12 DIAGNOSIS — B9689 Other specified bacterial agents as the cause of diseases classified elsewhere: Secondary | ICD-10-CM

## 2022-10-12 MED ORDER — METRONIDAZOLE 0.75 % VA GEL: 1.0000 | Freq: Every day | VAGINAL | 1 refills | Status: DC

## 2022-10-12 MED ORDER — METRONIDAZOLE 500 MG PO TABS: 500.0000 mg | ORAL_TABLET | Freq: Two times a day (BID) | ORAL | 0 refills | Status: DC

## 2022-10-13 ENCOUNTER — Encounter (HOSPITAL_COMMUNITY): Payer: Self-pay | Admitting: Obstetrics and Gynecology

## 2022-10-13 ENCOUNTER — Inpatient Hospital Stay (HOSPITAL_COMMUNITY)
Admission: AD | Admit: 2022-10-13 | Discharge: 2022-10-13 | Disposition: A | Discharge status: Home / Self Care | Payer: Medicaid Other | Attending: Obstetrics and Gynecology | Admitting: Obstetrics and Gynecology

## 2022-10-13 DIAGNOSIS — O209 Hemorrhage in early pregnancy, unspecified: Secondary | ICD-10-CM | POA: Insufficient documentation

## 2022-10-13 DIAGNOSIS — O26891 Other specified pregnancy related conditions, first trimester: Secondary | ICD-10-CM | POA: Insufficient documentation

## 2022-10-13 DIAGNOSIS — D563 Thalassemia minor: Secondary | ICD-10-CM | POA: Diagnosis not present

## 2022-10-13 DIAGNOSIS — Z3480 Encounter for supervision of other normal pregnancy, unspecified trimester: Secondary | ICD-10-CM

## 2022-10-13 DIAGNOSIS — Z3A09 9 weeks gestation of pregnancy: Secondary | ICD-10-CM | POA: Insufficient documentation

## 2022-10-13 NOTE — MAU Note (Signed)
Andrea Logan is a 37 y.o. at [redacted]w[redacted]d here in MAU reporting: spotting for a few days.  Went from dark red to bright red. No pain. 2-3 days ago had some low back pain. Denies recent intercourse. Has not had [place]  Onset of complaint: few days ago Pain score: none Vitals:   10/13/22 1422  BP: 121/72  Pulse: 75  Resp: 18  Temp: 98.1 F (36.7 C)  SpO2: 100%      Lab orders placed from triage:

## 2022-10-13 NOTE — Discharge Instructions (Signed)
Your ultrasound shows a normal pregnancy. We discussed that vaginal bleeding in the first trimester is common, and that 80-90% of patients will go on to have a normal pregnancy with a live delivery. The remainder are at increased risk for miscarriage, unfortunately there are no known interventions to mitigate this risk. We discussed return precautions including crescendo abdominal pain, heavy vaginal bleeding soaking >1 pad/hour, and fever.

## 2022-10-13 NOTE — MAU Provider Note (Signed)
History     [id number]  Arrival date and time: 10/13/22 1340    Chief Complaint  Patient presents with   Vaginal Bleeding     HPI Andrea Logan is a 37 y.o. at [redacted]w[redacted]d by sure LMP with PMHx notable for grand multiparity with NSVD x4 and CS x1 with last delivery being twins, who presents for vaginal bleeding.   She reports the bleeding began about 2 days ago, has not been bright red, mostly just brown No vaginal discharge, itching, or odor Having some low back pain but no abdominal cramping or pain Wants to make sure the pregnancy is ok  A/Positive/-- (05/21 1651)  OB History     Gravida  6   Para  4   Term  4   Preterm      AB  1   Living  5      SAB  1   IAB      Ectopic      Multiple  1   Live Births  5           Past Medical History:  Diagnosis Date   Alpha thalassemia silent carrier 10/14/2020   Anemia    Trichomonal vaginitis 07/13/2021   Vaginal Pap smear, abnormal     Past Surgical History:  Procedure Laterality Date   CERVICAL CERCLAGE     CERVICAL CERCLAGE N/A 10/08/2020   Procedure: CERCLAGE CERVICAL;  Surgeon: Adam Phenix, MD;  Location: [facility];  Service: Gynecology;  Laterality: N/A;   CERVICAL CONE BIOPSY     CESAREAN SECTION N/A 03/16/2021   Procedure: CESAREAN SECTION;  Surgeon: Venora Maples, MD;  Location: [facility]S;  Service: Obstetrics;  Laterality: N/A;    Family History  Problem Relation Age of Onset   Hypertension Mother    Diabetes Mother    Dementia Father    Cervical cancer Maternal Aunt     Social History   Socioeconomic History   Marital status: Single    Spouse name: Not on file   Number of children: 3   Years of education: Not on file   Highest education level: Associate degree: occupational, Scientist, product/process development, or vocational program  Occupational History   Not on file  Tobacco Use   Smoking status: Never   Smokeless tobacco: Never  Vaping Use   Vaping Use: Never used  Substance and Sexual  Activity   Alcohol use: No    Alcohol/week: 0.0 standard drinks of alcohol   Drug use: Never   Sexual activity: Yes    Birth control/protection: None  Other Topics Concern   Not on file  Social History Narrative   Not on file   Social Determinants of Health   Financial Resource Strain: Not on file  Food Insecurity: Not on file  Transportation Needs: Not on file  Physical Activity: Not on file  Stress: Not on file  Social Connections: Not on file  Intimate Partner Violence: Not on file    No Known Allergies  No current facility-administered medications on file prior to encounter.   Current Outpatient Medications on File Prior to Encounter  Medication Sig Dispense Refill   Prenatal Vit-Fe Fumarate-FA (MULTIVITAMIN-PRENATAL) 27-0.8 MG TABS tablet Take 1 tablet by mouth daily at 12 noon.     Lactic Ac-Citric Ac-Pot Bitart (PHEXXI) 1.8-1-0.4 % GEL Administer 1 applicator (5 g) intravaginally immediately before or up to 1 hour before [facility] act of vaginal intercourse as needed. (Patient not taking: Reported on  09/22/2022) 60 g 11   metroNIDAZOLE (METROGEL) 0.75 % vaginal gel Place 1 Applicatorful vaginally at bedtime. Apply one applicatorful to vagina at bedtime for 5 days 70 g 1     ROS Pertinent positives and negative per HPI, all others reviewed and negative  Physical Exam   BP 119/72 (BP Location: Right Arm)   Pulse 75   Temp 98.1 F (36.7 C) (Oral)   Resp 19   Ht 5\' 9"  (1.753 m)   Wt 110.4 kg   LMP 08/10/2022   SpO2 100%   BMI 35.93 kg/m   Patient Vitals for the past 24 hrs:  BP Temp Temp src Pulse Resp SpO2 Height Weight  10/13/22 1523 119/72 -- -- -- 19 100 % -- --  10/13/22 1422 121/72 98.1 F (36.7 C) Oral 75 18 100 % 5\' 9"  (1.753 m) 110.4 kg    Physical Exam Vitals reviewed.  Constitutional:      General: She is not in acute distress.    Appearance: She is well-developed. She is not diaphoretic.  Eyes:     General: No scleral icterus. Pulmonary:      Effort: Pulmonary effort is normal. No respiratory distress.  Abdominal:     General: There is no distension.     Palpations: Abdomen is soft.     Tenderness: There is no abdominal tenderness. There is no guarding or rebound.  Skin:    General: Skin is warm and dry.  Neurological:     Mental Status: She is alert.     Coordination: Coordination normal.      Cervical Exam    Bedside Ultrasound Pt informed that the ultrasound is considered a limited OB ultrasound and is not intended to be a complete ultrasound exam.  Patient also informed that the ultrasound is not being completed with the intent of assessing for fetal or placental anomalies or any pelvic abnormalities.  Explained that the purpose of today's ultrasound is to assess for  viability.  Patient acknowledges the purpose of the exam and the limitations of the study.      My interpretation: First trimester findings: Intrauterine gestational sac seen: yes Gestational sac summary: fetal pole seen Fetal cardiac activity: 179 bpm by M mode  Labs No results found for this or any previous visit (from the past 24 hour(s)).  Imaging No results found.  MAU Course  Procedures Lab Orders  No laboratory test(s) ordered today   No orders of the defined types were placed in this encounter.  Imaging Orders  No imaging studies ordered today    MDM Moderate (Level 3-4)  Assessment and Plan  #Vaginal bleeding in pregnancy, first trimester #[redacted] weeks gestation of pregnancy US shows viable IUP with normal FHR. We discussed that vaginal bleeding in the first trimester is common, and that 80-90% of patients will go on to have a normal pregnancy with a live delivery. The remainder are at increased risk for miscarriage, unfortunately there are no known interventions to mitigate this risk. A/Positive/-- (05/21 1651), rhogam not indicated. We discussed return precautions including crescendo abdominal pain, heavy vaginal bleeding soaking >1  pad/hour, and fever.   Dispo: discharged to home in stable condition    Venora Maples, MD/MPH 10/13/22 4:05 PM  Allergies as of 10/13/2022   No Known Allergies      Medication List     STOP taking these medications    Phexxi 1.8-1-0.4 % Gel Generic drug: Lactic Ac-Citric Ac-Pot Bitart  TAKE these medications    metroNIDAZOLE 0.75 % vaginal gel Commonly known as: METROGEL Place 1 Applicatorful vaginally at bedtime. Apply one applicatorful to vagina at bedtime for 5 days   multivitamin-prenatal 27-0.8 MG Tabs tablet Take 1 tablet by mouth daily at 12 noon.

## 2022-10-18 ENCOUNTER — Encounter (HOSPITAL_COMMUNITY): Payer: Self-pay | Admitting: Obstetrics & Gynecology

## 2022-10-18 ENCOUNTER — Inpatient Hospital Stay (HOSPITAL_COMMUNITY)
Admission: AD | Admit: 2022-10-18 | Discharge: 2022-10-19 | Disposition: A | Discharge status: Home / Self Care | Payer: Medicaid Other | Attending: Obstetrics & Gynecology | Admitting: Obstetrics & Gynecology

## 2022-10-18 DIAGNOSIS — Z3A10 10 weeks gestation of pregnancy: Secondary | ICD-10-CM | POA: Insufficient documentation

## 2022-10-18 DIAGNOSIS — O418X10 Other specified disorders of amniotic fluid and membranes, first trimester, not applicable or unspecified: Secondary | ICD-10-CM

## 2022-10-18 DIAGNOSIS — O208 Other hemorrhage in early pregnancy: Secondary | ICD-10-CM | POA: Insufficient documentation

## 2022-10-18 DIAGNOSIS — O26891 Other specified pregnancy related conditions, first trimester: Secondary | ICD-10-CM | POA: Insufficient documentation

## 2022-10-18 DIAGNOSIS — Z6790 Unspecified blood type, Rh positive: Secondary | ICD-10-CM

## 2022-10-18 DIAGNOSIS — Z3491 Encounter for supervision of normal pregnancy, unspecified, first trimester: Secondary | ICD-10-CM

## 2022-10-18 DIAGNOSIS — R102 Pelvic and perineal pain: Secondary | ICD-10-CM | POA: Insufficient documentation

## 2022-10-18 DIAGNOSIS — O09521 Supervision of elderly multigravida, first trimester: Secondary | ICD-10-CM | POA: Insufficient documentation

## 2022-10-18 NOTE — MAU Note (Signed)
..  Andrea Logan is a 37 y.o. at [redacted]w[redacted]d here in MAU reporting: abdominal and back cramping that began last night. Vaginal bleeding that has been on and off for three days, was seen in MAU for it  and was told to come back when she had clots. Reports she had three quarter sized clots.  Reports today she had a lot of bleeding once, where it filled up a pad and messed up her bed, but it is less now.  Pain score: 8/10 Vitals:   10/18/22 2344  BP: 103/67  Pulse: 77  Resp: 17  Temp: 98.1 F (36.7 C)  SpO2: 100%     FHT:n/a Lab orders placed from triage:  UA

## 2022-10-19 ENCOUNTER — Inpatient Hospital Stay (HOSPITAL_COMMUNITY): Payer: Medicaid Other

## 2022-10-19 DIAGNOSIS — R109 Unspecified abdominal pain: Secondary | ICD-10-CM | POA: Diagnosis not present

## 2022-10-19 DIAGNOSIS — Z3A10 10 weeks gestation of pregnancy: Secondary | ICD-10-CM | POA: Diagnosis not present

## 2022-10-19 DIAGNOSIS — O26891 Other specified pregnancy related conditions, first trimester: Secondary | ICD-10-CM | POA: Diagnosis not present

## 2022-10-19 DIAGNOSIS — O208 Other hemorrhage in early pregnancy: Secondary | ICD-10-CM | POA: Diagnosis not present

## 2022-10-19 DIAGNOSIS — R102 Pelvic and perineal pain: Secondary | ICD-10-CM | POA: Diagnosis present

## 2022-10-19 DIAGNOSIS — O09521 Supervision of elderly multigravida, first trimester: Secondary | ICD-10-CM | POA: Diagnosis not present

## 2022-10-19 LAB — CBC
HCT: 35.1 % — ABNORMAL LOW (ref 36.0–46.0)
Hemoglobin: 11.7 g/dL — ABNORMAL LOW (ref 12.0–15.0)
MCH: 27.7 pg (ref 26.0–34.0)
MCHC: 33.3 g/dL (ref 30.0–36.0)
MCV: 83 fL (ref 80.0–100.0)
Platelets: 239 10*3/uL (ref 150–400)
RBC: 4.23 MIL/uL (ref 3.87–5.11)
RDW: 16.5 % — ABNORMAL HIGH (ref 11.5–15.5)
WBC: 9.3 10*3/uL (ref 4.0–10.5)
nRBC: 0 % (ref 0.0–0.2)

## 2022-10-19 MED ORDER — OXYCODONE-ACETAMINOPHEN 5-325 MG PO TABS
2.0000 | ORAL_TABLET | Freq: Once | ORAL | Status: AC
  Administered 2022-10-19: 2 via ORAL
  Filled 2022-10-19: qty 2

## 2022-10-19 NOTE — MAU Provider Note (Signed)
History     CSN: [id number]  Arrival date and time: 10/18/22 2211   Event Date/Time   First Provider Initiated Contact with Patient 10/19/22 0007      Chief Complaint  Patient presents with   Vaginal Bleeding   Abdominal Pain   Back Pain   HPI Andrea Logan is a 37 y.o. O1H0865 at [redacted]w[redacted]d who presents to MAU with chief complaint of vaginal bleeding. This is a recurrent problem for which patient was seen in MAU on 10/13/2022. She states she was told to return if she started seeing clots. This evening she visualized three quarter-sized clots.  She is not saturating pads or her clothing. She is not experiencing dizziness, weakness or syncope.  Patient also reports lower abdominal cramping. Pain score is 8/10. She denies aggravating or alleviating factors. She has not taken medication for this complaint.  Patient receives care with [facility].  OB History     Gravida  6   Para  4   Term  4   Preterm      AB  1   Living  5      SAB  1   IAB      Ectopic      Multiple  1   Live Births  5           Past Medical History:  Diagnosis Date   Alpha thalassemia silent carrier 10/14/2020   Anemia    Trichomonal vaginitis 07/13/2021   Vaginal Pap smear, abnormal     Past Surgical History:  Procedure Laterality Date   CERVICAL CERCLAGE     CERVICAL CERCLAGE N/A 10/08/2020   Procedure: CERCLAGE CERVICAL;  Surgeon: Adam Phenix, MD;  Location: [facility];  Service: Gynecology;  Laterality: N/A;   CERVICAL CONE BIOPSY     CESAREAN SECTION N/A 03/16/2021   Procedure: CESAREAN SECTION;  Surgeon: Venora Maples, MD;  Location: [facility];  Service: Obstetrics;  Laterality: N/A;    Family History  Problem Relation Age of Onset   Hypertension Mother    Diabetes Mother    Dementia Father    Cervical cancer Maternal Aunt     Social History   Tobacco Use   Smoking status: Never   Smokeless tobacco: Never  Vaping Use   Vaping Use: Never used  Substance Use  Topics   Alcohol use: No    Alcohol/week: 0.0 standard drinks of alcohol   Drug use: Never    Allergies: No Known Allergies  Medications Prior to Admission  Medication Sig Dispense Refill Last Dose   Prenatal Vit-Fe Fumarate-FA (MULTIVITAMIN-PRENATAL) 27-0.8 MG TABS tablet Take 1 tablet by mouth daily at 12 noon.   10/18/2022   metroNIDAZOLE (METROGEL) 0.75 % vaginal gel Place 1 Applicatorful vaginally at bedtime. Apply one applicatorful to vagina at bedtime for 5 days 70 g 1     Review of Systems  Gastrointestinal:  Positive for abdominal pain.  Genitourinary:  Positive for vaginal bleeding.  All other systems reviewed and are negative.  Physical Exam   Blood pressure 103/67, pulse 77, temperature 98.1 F (36.7 C), temperature source Oral, resp. rate 17, height 5\' 9"  (1.753 m), last menstrual period 08/10/2022, SpO2 100 %, not currently breastfeeding.  Physical Exam Vitals and nursing note reviewed.  Constitutional:      General: She is not in acute distress.    Appearance: She is well-developed. She is not ill-appearing.  Cardiovascular:     Rate and Rhythm: Normal  rate.  Pulmonary:     Effort: Pulmonary effort is normal.  Abdominal:     General: Abdomen is flat.     Palpations: Abdomen is soft.     Tenderness: There is no abdominal tenderness.  Skin:    Capillary Refill: Capillary refill takes less than 2 seconds.  Neurological:     Mental Status: She is alert and oriented to person, place, and time.  Psychiatric:        Mood and Affect: Mood normal.        Behavior: Behavior normal.     MAU Course  Procedures  MDM Orders Placed This Encounter  Procedures   US OB Comp Less 14 Wks   CBC   Discharge patient   Patient Vitals for the past 24 hrs:  BP Temp Temp src Pulse Resp SpO2 Height  10/19/22 0145 (!) 93/57 -- -- 79 -- -- --  10/18/22 2344 103/67 98.1 F (36.7 C) Oral 77 17 100 % 5\' 9"  (1.753 m)   Results for orders placed or performed during the  hospital encounter of 10/18/22 (from the past 24 hour(s))  CBC     Status: Abnormal   Collection Time: 10/19/22  1:02 AM  Result Value Ref Range   WBC 9.3 4.0 - 10.5 K/uL   RBC 4.23 3.87 - 5.11 MIL/uL   Hemoglobin 11.7 (L) 12.0 - 15.0 g/dL   HCT 16.1 (L) 09.6 - 04.5 %   MCV 83.0 80.0 - 100.0 fL   MCH 27.7 26.0 - 34.0 pg   MCHC 33.3 30.0 - 36.0 g/dL   RDW 40.9 (H) 81.1 - 91.4 %   Platelets 239 150 - 400 K/uL   nRBC 0.0 0.0 - 0.2 %   US OB Comp Less 14 Wks  Result Date: 10/19/2022 CLINICAL DATA:  Pelvic pain and positive pregnancy test EXAM: OBSTETRIC <14 WK ULTRASOUND TECHNIQUE: Transabdominal ultrasound was performed for evaluation of the gestation as well as the maternal uterus and adnexal regions. COMPARISON:  None Available. FINDINGS: Intrauterine gestational sac: Present Yolk sac:  Absent Embryo:  Present Cardiac Activity: Present Heart Rate: 164 bpm CRL:   38.9 mm   10 w 6 d                  [place] EDC: 05/11/2023 Subchorionic hemorrhage:  [place] subchorionic hemorrhage is noted. Maternal uterus/adnexae: Ovaries are within normal limits. No free fluid is seen. IMPRESSION: Single live intrauterine gestation at 10 weeks 6 days. [place] subchorionic hemorrhage is noted. Electronically Signed   By: Alcide Clever M.D.   On: 10/19/2022 01:25     Assessment and Plan  --37 y.o. [id number] with live SIUP at [redacted]w[redacted]d  --Subchorionic hematoma, pelvic rest advised --Rh POS, Hgb 11.7 --Discharge home in stable condition with first trimester precautions  Calvert Cantor, MSA, MSN, CNM 10/19/2022, 3:51 AM

## 2022-10-19 NOTE — Discharge Instructions (Signed)

## 2022-11-02 ENCOUNTER — Other Ambulatory Visit (HOSPITAL_COMMUNITY)
Admission: RE | Admit: 2022-11-02 | Discharge: 2022-11-02 | Disposition: A | Discharge status: Home / Self Care | Payer: Medicaid Other | Source: Ambulatory Visit | Attending: Obstetrics | Admitting: Obstetrics

## 2022-11-02 ENCOUNTER — Ambulatory Visit (INDEPENDENT_AMBULATORY_CARE_PROVIDER_SITE_OTHER): Payer: Medicaid Other | Admitting: Obstetrics and Gynecology

## 2022-11-02 ENCOUNTER — Encounter: Payer: Self-pay | Admitting: Obstetrics

## 2022-11-02 ENCOUNTER — Ambulatory Visit (INDEPENDENT_AMBULATORY_CARE_PROVIDER_SITE_OTHER): Payer: Medicaid Other | Admitting: Licensed Clinical Social Worker

## 2022-11-02 VITALS — BP 112/72 | HR 72 | Wt 238.0 lb

## 2022-11-02 DIAGNOSIS — O09291 Supervision of pregnancy with other poor reproductive or obstetric history, first trimester: Secondary | ICD-10-CM

## 2022-11-02 DIAGNOSIS — Z6835 Body mass index (BMI) 35.0-35.9, adult: Secondary | ICD-10-CM | POA: Diagnosis not present

## 2022-11-02 DIAGNOSIS — O09521 Supervision of elderly multigravida, first trimester: Secondary | ICD-10-CM

## 2022-11-02 DIAGNOSIS — Z3480 Encounter for supervision of other normal pregnancy, unspecified trimester: Secondary | ICD-10-CM | POA: Diagnosis not present

## 2022-11-02 DIAGNOSIS — Z3A12 12 weeks gestation of pregnancy: Secondary | ICD-10-CM

## 2022-11-02 DIAGNOSIS — Z98891 History of uterine scar from previous surgery: Secondary | ICD-10-CM

## 2022-11-02 DIAGNOSIS — O09292 Supervision of pregnancy with other poor reproductive or obstetric history, second trimester: Secondary | ICD-10-CM | POA: Insufficient documentation

## 2022-11-02 DIAGNOSIS — O09511 Supervision of elderly primigravida, first trimester: Secondary | ICD-10-CM | POA: Diagnosis not present

## 2022-11-02 DIAGNOSIS — F321 Major depressive disorder, single episode, moderate: Secondary | ICD-10-CM | POA: Diagnosis not present

## 2022-11-02 MED ORDER — PRENATAL VITAMINS 28-0.8 MG PO TABS: 1.0000 | ORAL_TABLET | Freq: Every day | ORAL | 11 refills | Status: AC

## 2022-11-02 NOTE — H&P (View-Only) (Signed)
Subjective:  Andrea Logan is a 37 y.o. G6P4015 at [redacted]w[redacted]d being seen today for her first OB visit. EDD by LMP and confirmed by first trimester U/S.    She is currently monitored for the following issues for this high-risk pregnancy and has Depression; AMA (advanced maternal age) multigravida 35+; BMI 35.0-35.9,adult; Obesity due to excess calories without serious comorbidity; Supervision of other normal pregnancy, antepartum; History of cesarean section; and History of cervical incompetence in pregnancy, currently pregnant, first trimester on their problem list.  Patient reports no complaints.  Contractions: Not present. Vag. Bleeding: Scant (subchorio hematoma).   . Denies leaking of fluid.   The following portions of the patient's history were reviewed and updated as appropriate: allergies, current medications, past family history, past medical history, past social history, past surgical history and problem list. Problem list updated.  Objective:   Vitals:   11/02/22 1337  BP: 112/72  Pulse: 72  Weight: 238 lb (108 kg)    Fetal Status: Fetal Heart Rate (bpm): 166         General:  Alert, oriented and cooperative. Patient is in no acute distress.  Skin: Skin is warm and dry. No rash noted.   Cardiovascular: Normal heart rate noted  Respiratory: Normal respiratory effort, no problems with respiration noted  Abdomen: Soft, gravid, appropriate for gestational age. Pain/Pressure: Absent     Pelvic:  Cervical exam deferred        Extremities: Normal range of motion.  Edema: Trace  Mental Status: Normal mood and affect. Normal behavior. Normal judgment and thought content.   Urinalysis:      Assessment and Plan:  Pregnancy: G6P4015 at [redacted]w[redacted]d  1. Supervision of other normal pregnancy, antepartum Prenatal care and labs reviewed with pt Genetic testing discussed - Cervicovaginal ancillary only( [place]) - Culture, OB Urine - CBC/D/Plt+RPR+Rh+ABO+RubIgG... - Comp Met (CMET) - HgB  A1c - Babyscripts Schedule Optimization - Enroll Patient in PreNatal Babyscripts  2. History of cesarean section Discuss mode of delivery at later appts  3. History of cervical incompetence in pregnancy, currently pregnant, first trimester Will schedule for cerclage Information provided  4. Multigravida of advanced maternal age in first trimester Genetic testing  Preterm labor symptoms and general obstetric precautions including but not limited to vaginal bleeding, contractions, leaking of fluid and fetal movement were reviewed in detail with the patient. Please refer to After Visit Summary for other counseling recommendations.  Return in about 4 weeks (around 11/30/2022) for OB visit, face to face, MD only.   Santanna Olenik L, MD  

## 2022-11-02 NOTE — Progress Notes (Signed)
Pt presents for NOB. Last Pap 07/05/2022

## 2022-11-02 NOTE — Progress Notes (Signed)
Subjective:  Andrea Logan is a 37 y.o. [id number] at [redacted]w[redacted]d being seen today for her first OB visit. EDD by LMP and confirmed by first trimester U/S.    She is currently monitored for the following issues for this high-risk pregnancy and has Depression; AMA (advanced maternal age) multigravida 35+; BMI 35.0-35.9,adult; Obesity due to excess calories without serious comorbidity; Supervision of other normal pregnancy, antepartum; History of cesarean section; and History of cervical incompetence in pregnancy, currently pregnant, first trimester on their problem list.  Patient reports no complaints.  Contractions: Not present. Vag. Bleeding: Scant (subchorio hematoma).   . Denies leaking of fluid.   The following portions of the patient's history were reviewed and updated as appropriate: allergies, current medications, past family history, past medical history, past social history, past surgical history and problem list. Problem list updated.  Objective:   Vitals:   11/02/22 1337  BP: 112/72  Pulse: 72  Weight: 238 lb (108 kg)    Fetal Status: Fetal Heart Rate (bpm): 166         General:  Alert, oriented and cooperative. Patient is in no acute distress.  Skin: Skin is warm and dry. No rash noted.   Cardiovascular: Normal heart rate noted  Respiratory: Normal respiratory effort, no problems with respiration noted  Abdomen: Soft, gravid, appropriate for gestational age. Pain/Pressure: Absent     Pelvic:  Cervical exam deferred        Extremities: Normal range of motion.  Edema: Trace  Mental Status: Normal mood and affect. Normal behavior. Normal judgment and thought content.   Urinalysis:      Assessment and Plan:  Pregnancy: [id number] at [redacted]w[redacted]d  1. Supervision of other normal pregnancy, antepartum Prenatal care and labs reviewed with pt Genetic testing discussed - Cervicovaginal ancillary only( [place]) - Culture, OB Urine - CBC/D/Plt+RPR+Rh+ABO+RubIgG... - Comp Met (CMET) - HgB  A1c - Babyscripts Schedule Optimization - Enroll Patient in PreNatal Babyscripts  2. History of cesarean section Discuss mode of delivery at later appts  3. History of cervical incompetence in pregnancy, currently pregnant, first trimester Will schedule for cerclage Information provided  4. Multigravida of advanced maternal age in first trimester Genetic testing  Preterm labor symptoms and general obstetric precautions including but not limited to vaginal bleeding, contractions, leaking of fluid and fetal movement were reviewed in detail with the patient. Please refer to After Visit Summary for other counseling recommendations.  Return in about 4 weeks (around 11/30/2022) for OB visit, face to face, MD only.   Hermina Staggers, MD

## 2022-11-02 NOTE — Progress Notes (Signed)
New OB Intake  I connected with  Trinda Amspacher on 11/02/22 at  1:10 PM EDT by Video Visit and verified that I am speaking with the correct person using two identifiers. Nurse is located at [facility] and pt is located at [place].  I discussed the limitations, risks, security and privacy concerns of performing an evaluation and management service by telephone and the availability of in person appointments. I also discussed with the patient that there may be a patient responsible charge related to this service. The patient expressed understanding and agreed to proceed.  I explained I am completing New OB Intake today. We discussed her EDD of 05/17/2023 that is based on LMP of 08/10/2022 . Pt is [id number]. I reviewed her allergies, medications, Medical/Surgical/OB history, and appropriate screenings. I informed her of [facility] services. Based on history, this is a/an  pregnancy uncomplicated .   Patient Active Problem List   Diagnosis Date Noted   Supervision of other normal pregnancy, antepartum 09/22/2022   Obesity due to excess calories without serious comorbidity 07/06/2022   BMI 35.0-35.9,adult 07/05/2022   Possible exposure to STD 07/13/2021   Depression 08/24/2020    Concerns addressed today  Delivery Plans:  Plans to deliver at [facility].   MyChart/Babyscripts MyChart access verified. I explained pt will have some visits in office and some virtually. Babyscripts instructions given and order placed. Patient verifies receipt of registration text/e-mail. Account successfully created and app downloaded.  Blood Pressure Cuff  Explained after first prenatal appt pt will check weekly and document in Babyscripts.  Weight scale: Patient does / does not  have weight scale. Weight scale ordered for patient to pick up from [employer].   Anatomy US Explained first scheduled [place] will be around 19 weeks. Anatomy US scheduled for 19 weeks. Scheduled AFP lab only appointment if Centering[facility]regnancy pt for  same day as anatomy US.   Labs Discussed Avelina Laine genetic screening with patient. Would like both Panorama and Horizon drawn at new OB visit.Also if interested in genetic testing, tell patient she will need AFP 15-21 weeks to complete genetic testing .Routine prenatal labs needed.  Covid Vaccine Patient has not covid vaccine.     Informed patient of [facility] website  and placed link in her AVS.   Social Determinants of Health Food Insecurity: Patient expresses food insecurity. Food Market information given to patient; explained patient may visit at the end of first OB appointment. WIC Referral: Patient is interested in referral to [facility].  Transportation: Patient denies transportation needs. Childcare: Discussed no children allowed at ultrasound appointments. Offered childcare services; patient declines childcare services at this time.  Send link to Pregnancy Navigators   Placed OB Box on problem list and updated  First visit review I reviewed new OB appt with pt. I explained she will have a pelvic exam, ob bloodwork with genetic screening, and PAP smear. Explained pt will be seen by Harper,MD at first visit; encounter routed to appropriate provider. Explained that patient will be seen by pregnancy navigator following visit with provider. [facility] information placed in AVS.   Sharlyne Pacas, RN 11/02/2022  1:46 PM

## 2022-11-03 LAB — CBC/D/PLT+RPR+RH+ABO+RUBIGG...
Antibody Screen: NEGATIVE
Basophils Absolute: 0 10*3/uL (ref 0.0–0.2)
Basos: 0 %
EOS (ABSOLUTE): 0.1 10*3/uL (ref 0.0–0.4)
Eos: 2 %
HCV Ab: NONREACTIVE
HIV Screen 4th Generation wRfx: NONREACTIVE
Hematocrit: 37.8 % (ref 34.0–46.6)
Hemoglobin: 12.1 g/dL (ref 11.1–15.9)
Hepatitis B Surface Ag: NEGATIVE
Immature Grans (Abs): 0 10*3/uL (ref 0.0–0.1)
Immature Granulocytes: 1 %
Lymphocytes Absolute: 1.7 10*3/uL (ref 0.7–3.1)
Lymphs: 21 %
MCH: 26.9 pg (ref 26.6–33.0)
MCHC: 32 g/dL (ref 31.5–35.7)
MCV: 84 fL (ref 79–97)
Monocytes Absolute: 0.6 10*3/uL (ref 0.1–0.9)
Monocytes: 8 %
Neutrophils Absolute: 5.4 10*3/uL (ref 1.4–7.0)
Neutrophils: 68 %
Platelets: 258 10*3/uL (ref 150–450)
RBC: 4.49 x10E6/uL (ref 3.77–5.28)
RDW: 15 % (ref 11.7–15.4)
RPR Ser Ql: NONREACTIVE
Rh Factor: POSITIVE
Rubella Antibodies, IGG: 3.06 index (ref >0.99)
WBC: 7.8 10*3/uL (ref 3.4–10.8)

## 2022-11-03 LAB — COMPREHENSIVE METABOLIC PANEL
ALT: 14 IU/L (ref 0–32)
AST: 12 IU/L (ref 0–40)
Albumin: 4.1 g/dL (ref 3.9–4.9)
Alkaline Phosphatase: 57 IU/L (ref 44–121)
BUN/Creatinine Ratio: 11 (ref 9–23)
BUN: 8 mg/dL (ref 6–20)
Bilirubin Total: 0.3 mg/dL (ref 0.0–1.2)
CO2: 17 mmol/L — ABNORMAL LOW (ref 20–29)
Calcium: 10 mg/dL (ref 8.7–10.2)
Chloride: 110 mmol/L — ABNORMAL HIGH (ref 96–106)
Creatinine, Ser: 0.76 mg/dL (ref 0.57–1.00)
Globulin, Total: 3.2 g/dL (ref 1.5–4.5)
Glucose: 89 mg/dL (ref 70–99)
Potassium: 4.1 mmol/L (ref 3.5–5.2)
Sodium: 141 mmol/L (ref 134–144)
Total Protein: 7.3 g/dL (ref 6.0–8.5)
eGFR: 104 mL/min/{1.73_m2} (ref >59)

## 2022-11-03 LAB — HEMOGLOBIN A1C
Est. average glucose Bld gHb Est-mCnc: 117 mg/dL
Hgb A1c MFr Bld: 5.7 % — ABNORMAL HIGH (ref 4.8–5.6)

## 2022-11-03 LAB — HCV INTERPRETATION

## 2022-11-04 ENCOUNTER — Encounter (HOSPITAL_COMMUNITY): Payer: Self-pay | Admitting: *Deleted

## 2022-11-04 ENCOUNTER — Telehealth: Payer: Self-pay

## 2022-11-04 ENCOUNTER — Telehealth (HOSPITAL_COMMUNITY): Payer: Self-pay | Admitting: *Deleted

## 2022-11-04 LAB — CERVICOVAGINAL ANCILLARY ONLY
Bacterial Vaginitis (gardnerella): POSITIVE — AB
Candida Glabrata: NEGATIVE
Candida Vaginitis: NEGATIVE
Chlamydia: NEGATIVE
Comment: NEGATIVE
Comment: NEGATIVE
Comment: NEGATIVE
Comment: NEGATIVE
Comment: NEGATIVE
Comment: NORMAL
Neisseria Gonorrhea: NEGATIVE
Trichomonas: NEGATIVE

## 2022-11-04 NOTE — Telephone Encounter (Signed)
Preadmission screen  

## 2022-11-04 NOTE — Telephone Encounter (Signed)
S/w pt and advised of results and recommendations

## 2022-11-05 LAB — CULTURE, OB URINE

## 2022-11-05 LAB — URINE CULTURE, OB REFLEX

## 2022-11-07 ENCOUNTER — Encounter (HOSPITAL_COMMUNITY): Payer: Self-pay | Admitting: Obstetrics & Gynecology

## 2022-11-08 ENCOUNTER — Ambulatory Visit (HOSPITAL_COMMUNITY)
Admission: RE | Admit: 2022-11-08 | Discharge: 2022-11-08 | Disposition: A | Discharge status: Home / Self Care | Payer: Medicaid Other | Source: Ambulatory Visit | Attending: Obstetrics & Gynecology | Admitting: Obstetrics & Gynecology

## 2022-11-08 ENCOUNTER — Other Ambulatory Visit: Payer: Self-pay

## 2022-11-08 ENCOUNTER — Encounter (HOSPITAL_COMMUNITY)
Admission: RE | Disposition: A | Discharge status: Home / Self Care | Payer: Self-pay | Source: Ambulatory Visit | Attending: Obstetrics & Gynecology

## 2022-11-08 ENCOUNTER — Observation Stay (HOSPITAL_COMMUNITY): Payer: Medicaid Other

## 2022-11-08 ENCOUNTER — Encounter (HOSPITAL_COMMUNITY): Payer: Self-pay | Admitting: Obstetrics & Gynecology

## 2022-11-08 ENCOUNTER — Observation Stay (HOSPITAL_BASED_OUTPATIENT_CLINIC_OR_DEPARTMENT_OTHER): Payer: Medicaid Other

## 2022-11-08 ENCOUNTER — Other Ambulatory Visit: Payer: Self-pay | Admitting: Obstetrics

## 2022-11-08 DIAGNOSIS — Z3A12 12 weeks gestation of pregnancy: Secondary | ICD-10-CM | POA: Diagnosis not present

## 2022-11-08 DIAGNOSIS — Z8759 Personal history of other complications of pregnancy, childbirth and the puerperium: Secondary | ICD-10-CM | POA: Diagnosis not present

## 2022-11-08 DIAGNOSIS — F32A Depression, unspecified: Secondary | ICD-10-CM | POA: Insufficient documentation

## 2022-11-08 DIAGNOSIS — O09521 Supervision of elderly multigravida, first trimester: Secondary | ICD-10-CM | POA: Diagnosis not present

## 2022-11-08 DIAGNOSIS — O209 Hemorrhage in early pregnancy, unspecified: Secondary | ICD-10-CM | POA: Insufficient documentation

## 2022-11-08 DIAGNOSIS — O99211 Obesity complicating pregnancy, first trimester: Secondary | ICD-10-CM | POA: Insufficient documentation

## 2022-11-08 DIAGNOSIS — N888 Other specified noninflammatory disorders of cervix uteri: Secondary | ICD-10-CM

## 2022-11-08 DIAGNOSIS — B9689 Other specified bacterial agents as the cause of diseases classified elsewhere: Secondary | ICD-10-CM

## 2022-11-08 DIAGNOSIS — O3441 Maternal care for other abnormalities of cervix, first trimester: Secondary | ICD-10-CM | POA: Diagnosis not present

## 2022-11-08 DIAGNOSIS — O3430 Maternal care for cervical incompetence, unspecified trimester: Secondary | ICD-10-CM | POA: Diagnosis not present

## 2022-11-08 DIAGNOSIS — N841 Polyp of cervix uteri: Secondary | ICD-10-CM | POA: Diagnosis not present

## 2022-11-08 DIAGNOSIS — O3431 Maternal care for cervical incompetence, first trimester: Secondary | ICD-10-CM | POA: Insufficient documentation

## 2022-11-08 DIAGNOSIS — O34219 Maternal care for unspecified type scar from previous cesarean delivery: Secondary | ICD-10-CM | POA: Diagnosis not present

## 2022-11-08 DIAGNOSIS — O99341 Other mental disorders complicating pregnancy, first trimester: Secondary | ICD-10-CM | POA: Insufficient documentation

## 2022-11-08 DIAGNOSIS — O09291 Supervision of pregnancy with other poor reproductive or obstetric history, first trimester: Secondary | ICD-10-CM

## 2022-11-08 HISTORY — PX: CERVICAL CERCLAGE: SHX1329

## 2022-11-08 LAB — CBC
HCT: 37.8 % (ref 36.0–46.0)
Hemoglobin: 12.1 g/dL (ref 12.0–15.0)
MCH: 27.4 pg (ref 26.0–34.0)
MCHC: 32 g/dL (ref 30.0–36.0)
MCV: 85.7 fL (ref 80.0–100.0)
Platelets: 201 10*3/uL (ref 150–400)
RBC: 4.41 MIL/uL (ref 3.87–5.11)
RDW: 16 % — ABNORMAL HIGH (ref 11.5–15.5)
WBC: 8.2 10*3/uL (ref 4.0–10.5)
nRBC: 0 % (ref 0.0–0.2)

## 2022-11-08 SURGERY — CERCLAGE, CERVIX, VAGINAL APPROACH
Anesthesia: Spinal

## 2022-11-08 MED ORDER — LACTATED RINGERS IV SOLN: INTRAVENOUS | Status: DC

## 2022-11-08 MED ORDER — OXYCODONE-ACETAMINOPHEN 5-325 MG PO TABS: 1.0000 | ORAL_TABLET | Freq: Four times a day (QID) | ORAL | 0 refills | Status: DC | PRN

## 2022-11-08 MED ORDER — PHENYLEPHRINE HCL (PRESSORS) 10 MG/ML IV SOLN
INTRAVENOUS | Status: DC | PRN
  Administered 2022-11-08: 240 ug via INTRAVENOUS
  Administered 2022-11-08 (×5): 160 ug via INTRAVENOUS
  Administered 2022-11-08: 80 ug via INTRAVENOUS

## 2022-11-08 MED ORDER — STERILE WATER FOR IRRIGATION IR SOLN
Status: DC | PRN
  Administered 2022-11-08: 1000 mL

## 2022-11-08 MED ORDER — CHLOROPROCAINE HCL (PF) 3 % IJ SOLN
INTRAMUSCULAR | Status: DC | PRN
  Administered 2022-11-08: 1.6 mL via EPIDURAL

## 2022-11-08 MED ORDER — METRONIDAZOLE 0.75 % VA GEL
1.0000 | Freq: Every day | VAGINAL | 1 refills | Status: DC
Start: 2022-11-08 — End: 2022-12-29

## 2022-11-08 MED ORDER — FENTANYL CITRATE (PF) 100 MCG/2ML IJ SOLN
INTRAMUSCULAR | Status: AC
  Filled 2022-11-08: qty 2

## 2022-11-08 MED ORDER — DROPERIDOL 2.5 MG/ML IJ SOLN: 0.6250 mg | Freq: Once | INTRAMUSCULAR | Status: DC | PRN

## 2022-11-08 MED ORDER — FENTANYL CITRATE (PF) 100 MCG/2ML IJ SOLN
INTRAMUSCULAR | Status: DC | PRN
  Administered 2022-11-08: 15 ug via INTRATHECAL

## 2022-11-08 MED ORDER — FENTANYL CITRATE (PF) 100 MCG/2ML IJ SOLN: 25.0000 ug | INTRAMUSCULAR | Status: DC | PRN

## 2022-11-08 MED ORDER — PHENYLEPHRINE 80 MCG/ML (10ML) SYRINGE FOR IV PUSH (FOR BLOOD PRESSURE SUPPORT)
PREFILLED_SYRINGE | INTRAVENOUS | Status: AC
  Filled 2022-11-08: qty 10

## 2022-11-08 MED ORDER — ONDANSETRON HCL 4 MG/2ML IJ SOLN
INTRAMUSCULAR | Status: DC | PRN
  Administered 2022-11-08: 4 mg via INTRAVENOUS

## 2022-11-08 MED ORDER — CHLOROPROCAINE HCL (PF) 3 % IJ SOLN
INTRAMUSCULAR | Status: AC
  Filled 2022-11-08: qty 20

## 2022-11-08 SURGICAL SUPPLY — 14 items
CANISTER SUCT 3000ML PPV (MISCELLANEOUS) IMPLANT
GLOVE BIO SURGEON STRL SZ 6.5 (GLOVE) ×1 IMPLANT
GLOVE BIOGEL PI IND STRL 7.0 (GLOVE) ×2 IMPLANT
GOWN STRL REUS W/TWL LRG LVL3 (GOWN DISPOSABLE) ×2 IMPLANT
MAT PREVALON FULL STRYKER (MISCELLANEOUS) IMPLANT
NS IRRIG 1000ML POUR BTL (IV SOLUTION) ×1 IMPLANT
PACK VAGINAL MINOR WOMEN LF (CUSTOM PROCEDURE TRAY) ×1 IMPLANT
PAD OB MATERNITY 4.3X12.25 (PERSONAL CARE ITEMS) ×1 IMPLANT
PAD PREP 24X48 CUFFED NSTRL (MISCELLANEOUS) ×1 IMPLANT
SUT PROLENE 1 CT 1 30 (SUTURE) ×1 IMPLANT
TOWEL OR 17X24 6PK STRL BLUE (TOWEL DISPOSABLE) ×2 IMPLANT
TRAY FOLEY W/BAG SLVR 14FR (SET/KITS/TRAYS/PACK) ×1 IMPLANT
TUBING NON-CON 1/4 X 20 CONN (TUBING) IMPLANT
YANKAUER SUCT BULB TIP NO VENT (SUCTIONS) IMPLANT

## 2022-11-08 NOTE — Anesthesia Procedure Notes (Signed)
Spinal  Patient location during procedure: OR Start time: 11/08/2022 11:35 AM End time: 11/08/2022 11:38 AM Reason for block: surgical anesthesia Staffing Performed: anesthesiologist  Anesthesiologist: Kaylyn Layer, MD Performed by: Kaylyn Layer, MD Authorized by: Kaylyn Layer, MD   Preanesthetic Checklist Completed: patient identified, IV checked, risks and benefits discussed, monitors and equipment checked, pre-op evaluation and timeout performed Spinal Block Patient position: sitting Prep: DuraPrep and site prepped and draped Patient monitoring: heart rate, continuous pulse ox and blood pressure Approach: midline Location: L3-4 Injection technique: single-shot Needle Needle type: Pencan  Needle gauge: 24 G Needle length: 10 cm Assessment Sensory level: T10 Events: CSF return Additional Notes Risks, benefits, and alternative discussed. Patient gave consent to procedure. Prepped and draped in sitting position. Clear CSF obtained after one needle pass. Positive terminal aspiration. No pain or paraesthesias with injection. Patient tolerated procedure well. Vital signs stable. Amalia Greenhouse, MD

## 2022-11-08 NOTE — Transfer of Care (Signed)
Immediate Anesthesia Transfer of Care Note  Patient: Andrea Logan  Procedure(s) Performed: CERCLAGE CERVICAL  Patient Location: PACU  Anesthesia Type:Spinal  Level of Consciousness: awake, alert , and oriented  Airway & Oxygen Therapy: Patient Spontanous Breathing  Post-op Assessment: Report given to RN and Post -op Vital signs reviewed and stable  Post vital signs: Reviewed and stable  Last Vitals:  Vitals Value Taken Time  BP 116/92 11/08/22 1232  Temp    Pulse 87 11/08/22 1236  Resp 24 11/08/22 1236  SpO2 100 % 11/08/22 1236  Vitals shown include unvalidated device data.  Last Pain:  Vitals:   11/08/22 1019  TempSrc: Oral         Complications: No notable events documented.

## 2022-11-08 NOTE — Anesthesia Preprocedure Evaluation (Addendum)
Anesthesia Evaluation  Patient identified by MRN, date of birth, ID band Patient awake    Reviewed: Allergy & Precautions, NPO status , Patient's Chart, lab work & pertinent test results  History of Anesthesia Complications Negative for: history of anesthetic complications  Airway Mallampati: II  TM Distance: >3 FB Neck ROM: Full    Dental no notable dental hx.    Pulmonary neg pulmonary ROS   Pulmonary exam normal        Cardiovascular negative cardio ROS Normal cardiovascular exam     Neuro/Psych    Depression    negative neurological ROS     GI/Hepatic negative GI ROS, Neg liver ROS,,,  Endo/Other  negative endocrine ROS    Renal/GU negative Renal ROS     Musculoskeletal negative musculoskeletal ROS (+)    Abdominal   Peds  Hematology negative hematology ROS (+)   Anesthesia Other Findings Day of surgery medications reviewed with patient.  Reproductive/Obstetrics (+) Pregnancy (Incompetent cervix)                              Anesthesia Physical Anesthesia Plan  ASA: 2  Anesthesia Plan: Spinal   Post-op Pain Management: Minimal or no pain anticipated   Induction:   PONV Risk Score and Plan: 4 or greater and Treatment may vary due to age or medical condition and Ondansetron  Airway Management Planned: Natural Airway  Additional Equipment: None  Intra-op Plan:   Post-operative Plan:   Informed Consent: I have reviewed the patients History and Physical, chart, labs and discussed the procedure including the risks, benefits and alternatives for the proposed anesthesia with the patient or authorized representative who has indicated his/her understanding and acceptance.       Plan Discussed with: CRNA  Anesthesia Plan Comments:         Anesthesia Quick Evaluation

## 2022-11-08 NOTE — Anesthesia Postprocedure Evaluation (Signed)
Anesthesia Post Note  Patient: Music therapist  Procedure(s) Performed: CERCLAGE CERVICAL     Patient location during evaluation: PACU Anesthesia Type: Spinal Level of consciousness: awake and alert Pain management: pain level controlled Vital Signs Assessment: post-procedure vital signs reviewed and stable Respiratory status: spontaneous breathing, nonlabored ventilation and respiratory function stable Cardiovascular status: blood pressure returned to baseline Postop Assessment: no apparent nausea or vomiting, spinal receding, no headache and no backache Anesthetic complications: no   No notable events documented.  Last Vitals:  Vitals:   11/08/22 1330 11/08/22 1345  BP: (!) 98/54 (!) 109/48  Pulse: 83 88  Resp: (!) 22 (!) 31  Temp:    SpO2: 100% 100%    Last Pain:  Vitals:   11/08/22 1345  TempSrc:   PainSc: 0-No pain   Pain Goal:    LLE Motor Response: Purposeful movement (11/08/22 1345) LLE Sensation: Tingling (11/08/22 1345) RLE Motor Response: Purposeful movement (11/08/22 1345) RLE Sensation: Tingling (11/08/22 1345) L Sensory Level: L2-Upper inner thigh, upper buttock (11/08/22 1345) R Sensory Level: L2-Upper inner thigh, upper buttock (11/08/22 1345) Epidural/Spinal Function Cutaneous sensation: Able to Discern Pressure (11/08/22 1345), Patient able to flex knees: Yes (11/08/22 1345), Patient able to lift hips off bed: Yes (11/08/22 1345), Back pain beyond tenderness at insertion site: No (11/08/22 1345), Progressively worsening motor and/or sensory loss: No (11/08/22 1345), Bowel and/or bladder incontinence post epidural: No (11/08/22 1345)  Shanda Howells

## 2022-11-08 NOTE — Interval H&P Note (Signed)
History and Physical Interval Note:  11/08/2022 10:35 AM  Andrea Logan  has presented today for surgery, with the diagnosis of Incompetent cervix.  The various methods of treatment have been discussed with the patient and family. After consideration of risks, benefits and other options for treatment, the patient has consented to  Procedure(s): CERCLAGE CERVICAL (N/A) as a surgical intervention.  The risks of surgery were discussed in detail with the patient including but not limited to: bleeding which may require transfusion or reoperation; infection which may require prolonged hospitalization or re-hospitalization and antibiotic therapy; injury to bowel, bladder, ureters and major vessels or other surrounding organs which may lead to other procedures; thromboembolic phenomenon; incisional problems and other postoperative or anesthesia complications.  Patient was told that the likelihood that her condition and symptoms will be treated effectively with this surgical management was very high; the postoperative expectations were also discussed in detail. The patient also understands the alternative treatment options which were discussed in full. All questions were answered.   The patient's history has been reviewed, patient examined, no change in status, stable for surgery.  I have reviewed the patient's chart and labs.  Questions were answered to the patient's satisfaction.     Scheryl Darter
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
## 2022-11-08 NOTE — Op Note (Signed)
Andrea Logan   PROCEDURE DATE: 11/08/2022  PREOPERATIVE DIAGNOSIS: Intrauterine pregnancy at [redacted]w[redacted]d, history of cervical incompetence   POSTOPERATIVE DIAGNOSIS: The same and cervical mucosal tag PROCEDURE: Transvaginal McDonald Cervical Cerclage Placement, removal of cervical mucosal tag SURGEON:  Adam Phenix, MD  INDICATIONS: 37 y.o. [id number] at [redacted]w[redacted]d with history of cervical incompetence, here for cerclage placement.   The risks of surgery were discussed in detail with the patient including but not limited to: bleeding; infection which may require antibiotic therapy; injury to cervix, vagina other surrounding organs; risk of ruptured membranes and/or preterm delivery and other postoperative or anesthesia complications.  Written informed consent was obtained.    FINDINGS:  About 2 cm palpable cervical length in the vagina, closed cervix, suture knot placed anteriorly.  ANESTHESIA:  Spinal INTRAVENOUS FLUIDS: 2000  ml ESTIMATED BLOOD LOSS: 15 ml COMPLICATIONS: None immediate SPECIMEN: cervical mucosal tag PROCEDURE IN DETAIL:  The patient received intravenous antibiotics and had sequential compression devices applied to her lower extremities while in the preoperative area.  Reassuring fetal heart rate was also obtained using a doppler. She was then taken to the operating room where spinal anesthesia was administered and was found to be adequate.  She was placed in the dorsal lithotomy, and was prepped and draped in a sterile manner. Her bladder was catheterized for an unmeasured amount of clear, yellow urine.  After an adequate timeout was performed, a vaginal speculum was then placed in the patient's vagina. A 4x1 cm cerival mucosal tag was on the right. This was clamped and excised and ligated with 2-0 plan gut.  The anterior and posterior lips of the cervix were grasped with ring forceps. A curved needle with a 1-0 Prolene suture was inserted at 12 o'clock, as high as possible at the junction of  the rugated vaginal epithelium and the smooth cervix, at least 2 cm above the external os.  Four bites are taken circumferentially around the entire cervix in a purse-string fashion, each bite should be deep enough to extend at least midway into the cervical stroma, but not into the endocervical canal. The two ends of the suture were then tied securely anteriorly and cut, leaving the ends long enough to grasp with a clamp when it is time to remove it. There was minimal bleeding noted and the ring forceps were removed with good hemostasis noted.  No immediate complications noted.  All instruments were removed from the patient's vagina.  Indomethacin 100 mg rectal suppository was placed.  Instrument, needle and sponge counts were correct x 2. The patient tolerated the procedure well, and was taken to the recovery area awake and in stable condition.  Reassuring fetal heart rate was also obtained using a doppler in the recovery area.  The patient will be discharged to home as per PACU criteria.  Routine postoperative instructions given.  She was prescribed Percocet.  She will follow up in the clinic  for postoperative evaluation and ongoing prenatal care.  Adam Phenix, MD Obstetrician & Gynecologist, [facility] [place] for [facility], [facility]

## 2022-11-10 LAB — SURGICAL PATHOLOGY

## 2022-11-11 LAB — PANORAMA PRENATAL TEST FULL PANEL:PANORAMA TEST PLUS 5 ADDITIONAL MICRODELETIONS: FETAL FRACTION: 9.2

## 2022-12-01 ENCOUNTER — Encounter: Payer: Medicaid Other | Admitting: Obstetrics and Gynecology

## 2022-12-02 ENCOUNTER — Ambulatory Visit (INDEPENDENT_AMBULATORY_CARE_PROVIDER_SITE_OTHER): Payer: Medicaid Other | Admitting: Obstetrics & Gynecology

## 2022-12-02 VITALS — BP 100/68 | HR 89 | Wt 235.1 lb

## 2022-12-02 DIAGNOSIS — Z3480 Encounter for supervision of other normal pregnancy, unspecified trimester: Secondary | ICD-10-CM | POA: Diagnosis not present

## 2022-12-02 DIAGNOSIS — Z3A16 16 weeks gestation of pregnancy: Secondary | ICD-10-CM

## 2022-12-02 DIAGNOSIS — O09291 Supervision of pregnancy with other poor reproductive or obstetric history, first trimester: Secondary | ICD-10-CM

## 2022-12-02 DIAGNOSIS — O09522 Supervision of elderly multigravida, second trimester: Secondary | ICD-10-CM

## 2022-12-02 DIAGNOSIS — O09521 Supervision of elderly multigravida, first trimester: Secondary | ICD-10-CM

## 2022-12-02 DIAGNOSIS — Z98891 History of uterine scar from previous surgery: Secondary | ICD-10-CM

## 2022-12-02 NOTE — Progress Notes (Signed)
   PRENATAL VISIT NOTE  Subjective:  Andrea Logan is a 37 y.o. [id number] at [redacted]w[redacted]d being seen today for ongoing prenatal care.  She is currently monitored for the following issues for this high-risk pregnancy and has Depression; AMA (advanced maternal age) multigravida 35+; BMI 35.0-35.9,adult; Obesity due to excess calories without serious comorbidity; Supervision of other normal pregnancy, antepartum; History of cesarean section; and History of cervical incompetence in pregnancy, currently pregnant, first trimester on their problem list.  Patient reports no complaints.  Contractions: Not present. Vag. Bleeding: None.  Movement: Present. Denies leaking of fluid.   The following portions of the patient's history were reviewed and updated as appropriate: allergies, current medications, past family history, past medical history, past social history, past surgical history and problem list.   Objective:   Vitals:   12/02/22 0836  BP: 100/68  Pulse: 89  Weight: 235 lb 1.6 oz (106.6 kg)    Fetal Status: Fetal Heart Rate (bpm): 155   Movement: Present     General:  Alert, oriented and cooperative. Patient is in no acute distress.  Skin: Skin is warm and dry. No rash noted.   Cardiovascular: Normal heart rate noted  Respiratory: Normal respiratory effort, no problems with respiration noted  Abdomen: Soft, gravid, appropriate for gestational age.  Pain/Pressure: Absent     Pelvic: Cervical exam deferred        Extremities: Normal range of motion.  Edema: None  Mental Status: Normal mood and affect. Normal behavior. Normal judgment and thought content.   Assessment and Plan:  Pregnancy: [id number] at [redacted]w[redacted]d 1. Supervision of other normal pregnancy, antepartum ONTD screening - AFP, Serum, Open Spina Bifida - [place] MFM OB DETAIL +14 WK  2. Multigravida of advanced maternal age in first trimester Second trimester  3. History of cervical incompetence in pregnancy, currently pregnant, first  trimester Cerclage in place  4. History of cesarean section Plans TOLAC  Preterm labor symptoms and general obstetric precautions including but not limited to vaginal bleeding, contractions, leaking of fluid and fetal movement were reviewed in detail with the patient. Please refer to After Visit Summary for other counseling recommendations.   Return in about 4 weeks (around 12/30/2022).  Future Appointments  Date Time Provider Department Center  12/29/2022  1:30 PM Warden Fillers, MD [facility] None   Orders Placed This Encounter  Procedures   [place] MFM OB DETAIL +14 WK    Order Specific Question:   Reason for Exam (SYMPTOM  OR DIAGNOSIS REQUIRED)    Answer:   Anatomy    Order Specific Question:   Preferred Location    Answer:   [facility]   AFP, Serum, Open Spina Bifida    Order Specific Question:   Is patient insulin dependent?    Answer:   No    Order Specific Question:   Gestational Age (GA), weeks    Answer:   16.2    Order Specific Question:   Date on which patient was at this GA    Answer:   12/02/2022    Order Specific Question:   GA Calculation Method    Answer:   LMP    Order Specific Question:   Number of fetuses    Answer:   1     Scheryl Darter, MD

## 2022-12-02 NOTE — Progress Notes (Signed)
Pt presents for rob Desires AFP today [place] order placed today

## 2022-12-07 LAB — AFP, SERUM, OPEN SPINA BIFIDA
AFP MoM: 1.44
AFP Value: 38.1 ng/mL
Gest. Age on Collection Date: 16.2 weeks
Maternal Age At EDD: 37.2 yr
OSBR Risk 1 IN: 3220
Test Results:: NEGATIVE
Weight: 235 [lb_av]

## 2022-12-17 DIAGNOSIS — O3430 Maternal care for cervical incompetence, unspecified trimester: Secondary | ICD-10-CM | POA: Insufficient documentation

## 2022-12-17 DIAGNOSIS — D563 Thalassemia minor: Secondary | ICD-10-CM | POA: Insufficient documentation

## 2022-12-22 ENCOUNTER — Other Ambulatory Visit: Payer: Self-pay | Admitting: Obstetrics & Gynecology

## 2022-12-22 ENCOUNTER — Ambulatory Visit: Payer: Medicaid Other

## 2022-12-22 ENCOUNTER — Ambulatory Visit: Payer: Medicaid Other | Attending: Obstetrics & Gynecology

## 2022-12-22 ENCOUNTER — Other Ambulatory Visit: Payer: Self-pay | Admitting: *Deleted

## 2022-12-22 VITALS — BP 109/63 | HR 86

## 2022-12-22 DIAGNOSIS — O99212 Obesity complicating pregnancy, second trimester: Secondary | ICD-10-CM | POA: Insufficient documentation

## 2022-12-22 DIAGNOSIS — O09522 Supervision of elderly multigravida, second trimester: Secondary | ICD-10-CM

## 2022-12-22 DIAGNOSIS — Z3480 Encounter for supervision of other normal pregnancy, unspecified trimester: Secondary | ICD-10-CM | POA: Insufficient documentation

## 2022-12-22 DIAGNOSIS — Z3A19 19 weeks gestation of pregnancy: Secondary | ICD-10-CM | POA: Insufficient documentation

## 2022-12-22 DIAGNOSIS — O34219 Maternal care for unspecified type scar from previous cesarean delivery: Secondary | ICD-10-CM | POA: Insufficient documentation

## 2022-12-22 DIAGNOSIS — Z148 Genetic carrier of other disease: Secondary | ICD-10-CM | POA: Insufficient documentation

## 2022-12-22 DIAGNOSIS — O09291 Supervision of pregnancy with other poor reproductive or obstetric history, first trimester: Secondary | ICD-10-CM

## 2022-12-22 DIAGNOSIS — O09521 Supervision of elderly multigravida, first trimester: Secondary | ICD-10-CM

## 2022-12-22 DIAGNOSIS — D563 Thalassemia minor: Secondary | ICD-10-CM

## 2022-12-22 DIAGNOSIS — O3432 Maternal care for cervical incompetence, second trimester: Secondary | ICD-10-CM

## 2022-12-22 DIAGNOSIS — Z363 Encounter for antenatal screening for malformations: Secondary | ICD-10-CM | POA: Diagnosis present

## 2022-12-22 DIAGNOSIS — E669 Obesity, unspecified: Secondary | ICD-10-CM

## 2022-12-22 DIAGNOSIS — O321XX0 Maternal care for breech presentation, not applicable or unspecified: Secondary | ICD-10-CM | POA: Diagnosis not present

## 2022-12-22 DIAGNOSIS — O99012 Anemia complicating pregnancy, second trimester: Secondary | ICD-10-CM

## 2022-12-22 DIAGNOSIS — O09292 Supervision of pregnancy with other poor reproductive or obstetric history, second trimester: Secondary | ICD-10-CM | POA: Diagnosis not present

## 2022-12-22 DIAGNOSIS — Z362 Encounter for other antenatal screening follow-up: Secondary | ICD-10-CM

## 2022-12-29 ENCOUNTER — Ambulatory Visit (INDEPENDENT_AMBULATORY_CARE_PROVIDER_SITE_OTHER): Payer: Medicaid Other | Admitting: Obstetrics and Gynecology

## 2022-12-29 VITALS — BP 102/68 | HR 93 | Wt 236.0 lb

## 2022-12-29 DIAGNOSIS — Z3A20 20 weeks gestation of pregnancy: Secondary | ICD-10-CM

## 2022-12-29 DIAGNOSIS — Z3480 Encounter for supervision of other normal pregnancy, unspecified trimester: Secondary | ICD-10-CM

## 2022-12-29 DIAGNOSIS — O09292 Supervision of pregnancy with other poor reproductive or obstetric history, second trimester: Secondary | ICD-10-CM

## 2022-12-29 DIAGNOSIS — O09522 Supervision of elderly multigravida, second trimester: Secondary | ICD-10-CM

## 2022-12-29 DIAGNOSIS — O3432 Maternal care for cervical incompetence, second trimester: Secondary | ICD-10-CM

## 2022-12-29 DIAGNOSIS — Z98891 History of uterine scar from previous surgery: Secondary | ICD-10-CM

## 2022-12-29 DIAGNOSIS — D563 Thalassemia minor: Secondary | ICD-10-CM

## 2022-12-29 MED ORDER — ASPIRIN 81 MG PO CHEW
81.0000 mg | CHEWABLE_TABLET | Freq: Every day | ORAL | 5 refills | Status: DC
Start: 2022-12-29 — End: 2023-05-10

## 2022-12-29 NOTE — Progress Notes (Signed)
   PRENATAL VISIT NOTE  Subjective:  Andrea Logan is a 37 y.o. [id number] at [redacted]w[redacted]d being seen today for ongoing prenatal care.  She is currently monitored for the following issues for this high-risk pregnancy and has AMA (advanced maternal age) multigravida 35+; BMI 35.0-35.9,adult; Obesity due to excess calories without serious comorbidity; Supervision of other normal pregnancy, antepartum; History of cesarean section; History of cervical incompetence in pregnancy, currently pregnant, second trimester; Cervical cerclage suture present; and Alpha thalassemia silent carrier on their problem list.  Patient doing well with no acute concerns today. She reports no complaints.  Contractions: Not present. Vag. Bleeding: None.  Movement: Present. Denies leaking of fluid.   The following portions of the patient's history were reviewed and updated as appropriate: allergies, current medications, past family history, past medical history, past social history, past surgical history and problem list. Problem list updated.  Objective:   Vitals:   12/29/22 1351  BP: 102/68  Pulse: 93  Weight: 236 lb (107 kg)    Fetal Status: Fetal Heart Rate (bpm): 150   Movement: Present     General:  Alert, oriented and cooperative. Patient is in no acute distress.  Skin: Skin is warm and dry. No rash noted.   Cardiovascular: Normal heart rate noted  Respiratory: Normal respiratory effort, no problems with respiration noted  Abdomen: Soft, gravid, appropriate for gestational age.  Pain/Pressure: Absent     Pelvic: Cervical exam deferred        Extremities: Normal range of motion.     Mental Status:  Normal mood and affect. Normal behavior. Normal judgment and thought content.   Assessment and Plan:  Pregnancy: [id number] at [redacted]w[redacted]d  1. [redacted] weeks gestation of pregnancy   2. Cervical cerclage suture present in second trimester Pt notes pelvic rest, denies any new symptoms  3. Multigravida of advanced maternal age in  second trimester   4. Alpha thalassemia silent carrier   5. History of cesarean section Pt desires TOLAC, sign consent around time of glucose test  6. Supervision of other normal pregnancy, antepartum Continue routine prenatal care  - aspirin 81 MG chewable tablet; Chew 1 tablet (81 mg total) by mouth daily.  Dispense: 30 tablet; Refill: 5  7. History of cervical incompetence in pregnancy, currently pregnant, second trimester   Preterm labor symptoms and general obstetric precautions including but not limited to vaginal bleeding, contractions, leaking of fluid and fetal movement were reviewed in detail with the patient.  Please refer to After Visit Summary for other counseling recommendations.   Return in about 4 weeks (around 01/26/2023) for [facility], in person.   Mariel Aloe, MD Faculty Attending Center for [facility]

## 2023-01-26 ENCOUNTER — Encounter: Payer: Medicaid Other | Admitting: Licensed Clinical Social Worker

## 2023-01-26 ENCOUNTER — Ambulatory Visit: Payer: Medicaid Other | Attending: Maternal & Fetal Medicine

## 2023-01-26 ENCOUNTER — Telehealth (INDEPENDENT_AMBULATORY_CARE_PROVIDER_SITE_OTHER): Payer: Medicaid Other | Admitting: Obstetrics and Gynecology

## 2023-01-26 ENCOUNTER — Other Ambulatory Visit: Payer: Self-pay

## 2023-01-26 DIAGNOSIS — O09522 Supervision of elderly multigravida, second trimester: Secondary | ICD-10-CM

## 2023-01-26 DIAGNOSIS — Z98891 History of uterine scar from previous surgery: Secondary | ICD-10-CM

## 2023-01-26 DIAGNOSIS — O99212 Obesity complicating pregnancy, second trimester: Secondary | ICD-10-CM

## 2023-01-26 DIAGNOSIS — O3432 Maternal care for cervical incompetence, second trimester: Secondary | ICD-10-CM | POA: Diagnosis not present

## 2023-01-26 DIAGNOSIS — O09292 Supervision of pregnancy with other poor reproductive or obstetric history, second trimester: Secondary | ICD-10-CM

## 2023-01-26 DIAGNOSIS — Z3A24 24 weeks gestation of pregnancy: Secondary | ICD-10-CM | POA: Diagnosis not present

## 2023-01-26 DIAGNOSIS — O34219 Maternal care for unspecified type scar from previous cesarean delivery: Secondary | ICD-10-CM | POA: Insufficient documentation

## 2023-01-26 DIAGNOSIS — Z3480 Encounter for supervision of other normal pregnancy, unspecified trimester: Secondary | ICD-10-CM

## 2023-01-26 DIAGNOSIS — D563 Thalassemia minor: Secondary | ICD-10-CM

## 2023-01-26 DIAGNOSIS — Z362 Encounter for other antenatal screening follow-up: Secondary | ICD-10-CM | POA: Insufficient documentation

## 2023-01-26 DIAGNOSIS — O99012 Anemia complicating pregnancy, second trimester: Secondary | ICD-10-CM

## 2023-01-26 DIAGNOSIS — Z8661 Personal history of infections of the central nervous system: Secondary | ICD-10-CM

## 2023-01-26 DIAGNOSIS — E669 Obesity, unspecified: Secondary | ICD-10-CM | POA: Diagnosis not present

## 2023-01-26 NOTE — Progress Notes (Signed)
OBSTETRICS PRENATAL VIRTUAL VISIT ENCOUNTER NOTE  Provider [facility]: [facility]   Patient location: Home  I connected with Andrea Logan on 01/26/23 at  2:50 PM EDT by MyChart Video Encounter and verified that I am speaking with the correct person using two identifiers. I discussed the limitations, risks, security and privacy concerns of performing an evaluation and management service virtually and the availability of in person appointments. I also discussed with the patient that there may be a patient responsible charge related to this service. The patient expressed understanding and agreed to proceed. Subjective:  Andrea Logan is a 37 y.o. Z6X0960 at [redacted]w[redacted]d being seen today for ongoing prenatal care.  She is currently monitored for the following issues for this high-risk pregnancy and has AMA (advanced maternal age) multigravida 35+; BMI 35.0-35.9,adult; Obesity due to excess calories without serious comorbidity; Supervision of other normal pregnancy, antepartum; History of cesarean section; History of cervical incompetence in pregnancy, currently pregnant, second trimester; Cervical cerclage suture present; and Alpha thalassemia silent carrier on their problem list.  Patient reports no complaints.  Contractions: Not present.  .  Movement: Present. Denies any leaking of fluid.   The following portions of the patient's history were reviewed and updated as appropriate: allergies, current medications, past family history, past medical history, past social history, past surgical history and problem list.   Objective:  There were no vitals filed for this visit.  Fetal Status:     Movement: Present     General:  Alert, oriented and cooperative. Patient is in no acute distress.  Respiratory: Normal respiratory effort, no problems with respiration noted  Mental Status: Normal mood and affect. Normal behavior. Normal judgment and thought content.  Rest of physical exam  deferred due to type of encounter  Imaging: [place] MFM OB FOLLOW UP  Result Date: 01/26/2023 ----------------------------------------------------------------------  OBSTETRICS REPORT                       (Signed Final 01/26/2023 10:11 am) ---------------------------------------------------------------------- Patient Info  ID #:       [id number]                          D.O.B.:  May 27, 1985 (36 yrs)  Name:       Andrea Logan                    Visit Date: 01/26/2023 09:30 am ---------------------------------------------------------------------- Performed By  Attending:        Noralee Space MD        Ref. Address:     [address]                                                             [address] [phone]  [place]                                                             [address]  Performed By:     Earley Brooke     [facility]                    BS, RDMS                                 [facility]  Referred By:      [facility] ---------------------------------------------------------------------- Orders  #  Description                           Code        Ordered By  1  [place] MFM OB FOLLOW UP                   [phone]    Braxton Feathers ----------------------------------------------------------------------  #  Order #                     Accession #                Episode #  1  [id number]                   [id number]                 [id number] ---------------------------------------------------------------------- Indications  Antenatal follow-up for nonvisualized fetal    Z36.2  anatomy  [redacted] weeks gestation of pregnancy                Z3A.24  Cervical cerclage suture present, second       O34.32  trimester (Placed 11/08/22)  Genetic  carrier (Alpha thalassemia silent      Z14.8  carrier)  History of cesarean delivery, currently        O34.219  pregnant  Obesity complicating pregnancy, second         O99.212  trimester (BMI 70)  Advanced maternal age multigravida 51+,        O68.522  second trimester (36 yrs)  Poor obstetrical history (Cervical             O09.299  Incompetence)  Encounter for antenatal screening for          Z36.3  malformations ---------------------------------------------------------------------- Fetal Evaluation  Num Of Fetuses:         1  Fetal Heart Rate(bpm):  153  Cardiac Activity:       Observed  Presentation:           Transverse, head to maternal left  Placenta:               Right lateral  P. Cord Insertion:      Visualized  Amniotic Fluid  AFI FV:      Within normal limits                              Largest Pocket(cm)                              6.89 ---------------------------------------------------------------------- Biometry  BPD:      62.6  mm     G. Age:  25w 3d         84  %    CI:        78.49   %    70 - 86                                                          FL/HC:      20.1   %    18.7 - 20.9  HC:      223.5  mm     G. Age:  24w 3d         40  %    HC/AC:      1.05        1.05 - 1.21  AC:      212.8  mm     G. Age:  25w 6d         87  %    FL/BPD:     71.9   %    71 - 87  FL:         45  mm     G. Age:  24w 6d         61  %    FL/AC:      21.1   %    20 - 24  Est. FW:     793  gm    1 lb 12 oz      89  % ---------------------------------------------------------------------- OB History  Gravidity:    6         Term:   4        Prem:   0        SAB:   1  TOP:          0       Ectopic:  0        Living: 5 ---------------------------------------------------------------------- Gestational Age  LMP:           24w 1d        Date:  08/10/22                  EDD:   05/17/23  U/S Today:     25w 1d  EDD:   05/10/23  Best:          24w 1d     Det. By:  LMP  (08/10/22)           EDD:   05/17/23 ---------------------------------------------------------------------- Anatomy  Cranium:               Appears normal         LVOT:                   Appears normal  Cavum:                 Previously seen        Aortic Arch:            Previously seen  Ventricles:            Appears normal         Ductal Arch:            Previously seen  Choroid Plexus:        Previously seen        Diaphragm:              Previously seen  Cerebellum:            Previously seen        Stomach:                Appears normal, left                                                                        sided  Posterior Fossa:       Previously seen        Abdomen:                Appears normal  Nuchal Fold:           Previously seen        Abdominal Wall:         Previously seen  Face:                  Orbits previously      Cord Vessels:           Previously seen                         seen. Profile WNL  Lips:                  Previously seen        Kidneys:                Appear normal  Palate:                Not well visualized    Bladder:                Appears normal  Thoracic:              Appears normal         Spine:                  Previously seen  Heart:  Appears normal         Upper Extremities:      Previously seen                         (4CH, axis, and                         situs)  RVOT:                  Appears normal         Lower Extremities:      Previously seen  Other:  Female gender previously seen. Heels, hands, VC previously visualized.          Technically difficult due to fetal position and movement. ---------------------------------------------------------------------- Cervix Uterus Adnexa  Cervix  Closed ---------------------------------------------------------------------- Impression  Patient returned for completion of fetal anatomy .Amniotic  fluid is normal and good fetal activity is seen .Fetal biometry  is consistent with her previously-established dates .Fetal  anatomical  survey was completed and appears normal.  No evidence of previa or placenta accreta spectrum. On  transabdominal scan, the cervix looks long and closed  (prophylactic cerclage in this pregnancy). ---------------------------------------------------------------------- Recommendations  -An appointment was made for her to return in 8 weeks for  fetal growth assessment. ----------------------------------------------------------------------                 Noralee Space, MD Electronically Signed Final Report   01/26/2023 10:11 am ----------------------------------------------------------------------    Assessment and Plan:  Pregnancy: [id number] at [redacted]w[redacted]d 1. Cervical cerclage suture present in second trimester Pt just seen by MFM with scan, cvx long and closed  2. Alpha thalassemia silent carrier   3. Multigravida of advanced maternal age in second trimester   4. History of cesarean section Pt currently leaning toward VBAC, will sign consent at 28 week visit  5. Supervision of other normal pregnancy, antepartum Continue routine prenatal care  Preterm labor symptoms and general obstetric precautions including but not limited to vaginal bleeding, contractions, leaking of fluid and fetal movement were reviewed in detail with the patient. I discussed the assessment and treatment plan with the patient. The patient was provided an opportunity to ask questions and all were answered. The patient agreed with the plan and demonstrated an understanding of the instructions. The patient was advised to call back or seek an in-person office evaluation/go to MAU at [facility] for any urgent or concerning symptoms. Please refer to After Visit Summary for other counseling recommendations.   I provided 10 minutes of face-to-face time during this encounter.  Return in about 3 weeks (around 02/16/2023) for [facility], in person, 2 hr GTT, 3rd trim labs.  Future Appointments  Date Time Provider Department Center   03/23/2023 10:30 AM [facility] US4 [facility]    Warden Fillers, MD [facility] for [employer], [facility]

## 2023-01-26 NOTE — Progress Notes (Signed)
MyChart OB, reports no concerns today.

## 2023-02-17 ENCOUNTER — Ambulatory Visit (INDEPENDENT_AMBULATORY_CARE_PROVIDER_SITE_OTHER): Payer: Medicaid Other | Admitting: Obstetrics and Gynecology

## 2023-02-17 ENCOUNTER — Encounter: Payer: Self-pay | Admitting: *Deleted

## 2023-02-17 ENCOUNTER — Encounter: Payer: Self-pay | Admitting: Obstetrics and Gynecology

## 2023-02-17 VITALS — BP 100/67 | HR 91 | Wt 244.0 lb

## 2023-02-17 DIAGNOSIS — Z3480 Encounter for supervision of other normal pregnancy, unspecified trimester: Secondary | ICD-10-CM | POA: Diagnosis not present

## 2023-02-17 DIAGNOSIS — Z3A27 27 weeks gestation of pregnancy: Secondary | ICD-10-CM | POA: Diagnosis not present

## 2023-02-17 DIAGNOSIS — Z98891 History of uterine scar from previous surgery: Secondary | ICD-10-CM

## 2023-02-17 DIAGNOSIS — Z3482 Encounter for supervision of other normal pregnancy, second trimester: Secondary | ICD-10-CM

## 2023-02-17 DIAGNOSIS — O3432 Maternal care for cervical incompetence, second trimester: Secondary | ICD-10-CM

## 2023-02-17 DIAGNOSIS — O09522 Supervision of elderly multigravida, second trimester: Secondary | ICD-10-CM

## 2023-02-17 DIAGNOSIS — O09292 Supervision of pregnancy with other poor reproductive or obstetric history, second trimester: Secondary | ICD-10-CM

## 2023-02-17 NOTE — Progress Notes (Signed)
PRENATAL VISIT NOTE  Subjective:  Andrea Logan is a 37 y.o. [id number] at [redacted]w[redacted]d being seen today for ongoing prenatal care.  She is currently monitored for the following issues for this high-risk pregnancy and has AMA (advanced maternal age) multigravida 35+; BMI 35.0-35.9,adult; Obesity due to excess calories without serious comorbidity; Supervision of other normal pregnancy, antepartum; History of cesarean section; History of cervical incompetence in pregnancy, currently pregnant, second trimester; Cervical cerclage suture present; and Alpha thalassemia silent carrier on their problem list.  Patient reports no complaints.  Contractions: Not present. Vag. Bleeding: None.  Movement: Present. Denies leaking of fluid.   The following portions of the patient's history were reviewed and updated as appropriate: allergies, current medications, past family history, past medical history, past social history, past surgical history and problem list.   Objective:   Vitals:   02/17/23 0931  BP: 100/67  Pulse: 91  Weight: 244 lb (110.7 kg)    Fetal Status: Fetal Heart Rate (bpm): 150   Movement: Present     General:  Alert, oriented and cooperative. Patient is in no acute distress.  Skin: Skin is warm and dry. No rash noted.   Cardiovascular: Normal heart rate noted  Respiratory: Normal respiratory effort, no problems with respiration noted  Abdomen: Soft, gravid, appropriate for gestational age.  Pain/Pressure: Present     Pelvic: Cervical exam deferred        Extremities: Normal range of motion.     Mental Status: Normal mood and affect. Normal behavior. Normal judgment and thought content.   Assessment and Plan:  Pregnancy: [id number] at [redacted]w[redacted]d 1. Supervision of other normal pregnancy, antepartum 28 week labs today Discussed [facility] plan - considering tubal but uncomfortable with permanence of it although she is sure she is done. Discussed cu-IUD. She has tried OCPs, patch, depo, nexplanon and mirena  and has not done well on those for varying reasons.   2. History of cesarean section - We discussed her history of c-section. Her previous c-section was due to  fetal malpresentation in the setting of twins.  She has a history of  successful vaginal delivery - We discussed the risks associated with repeat c-section: bleeding, infection, injury to surrounding organs/tissues I.e. bowel/bladder, development of scar tissue, wound complications such as wound separation or infection, need for additional surgery, percreta/acreta - We discussed the risks associated with TOLAC: risk of it being unsuccessful, specially in the context of her history, the risks in general of a vaginal delivery (prolapse, SUI, differences in recovery, pelvic floor dysfunction, etc), and the risk of uterine rupture. We discussed with the risk of uterine rupture that while rare it is not easily predicted, that it is a surgical emergency, and it can be potentially catastrophic for mom and baby. We discussed if uterine rupture that it may necessitate hysterectomy if the rupture caused issues with bleeding that could not be managed with other surgical options.  - After counseling, the patient was given the opportunity to ask questions and all questions answered.  - After considering her options, she would like to [facility] - Information provided to the patient   3. Multigravida of advanced maternal age in second trimester LR NIPS  4. Cervical cerclage suture present in second trimester Placed due to h/o of cervical incompetence.   5. History of cervical incompetence in pregnancy, currently pregnant, second trimester   6. Pregnancy with 27 completed weeks gestation   Preterm labor symptoms and general obstetric precautions including but not limited to  vaginal bleeding, contractions, leaking of fluid and fetal movement were reviewed in detail with the patient. Please refer to After Visit Summary for other counseling recommendations.    Return in about 2 weeks (around 03/03/2023) for OB VISIT, MD or APP.  Future Appointments  Date Time Provider Department Center  03/23/2023 10:30 AM [facility] US4 [facility]    Milas Hock, MD

## 2023-02-17 NOTE — Progress Notes (Signed)
Pt declines vaccines today

## 2023-02-18 LAB — CBC
Hematocrit: 37.4 % (ref 34.0–46.6)
Hemoglobin: 12.1 g/dL (ref 11.1–15.9)
MCH: 29.2 pg (ref 26.6–33.0)
MCHC: 32.4 g/dL (ref 31.5–35.7)
MCV: 90 fL (ref 79–97)
Platelets: 163 10*3/uL (ref 150–450)
RBC: 4.14 x10E6/uL (ref 3.77–5.28)
RDW: 13.2 % (ref 11.7–15.4)
WBC: 11.3 10*3/uL — ABNORMAL HIGH (ref 3.4–10.8)

## 2023-02-18 LAB — GLUCOSE TOLERANCE, 1 HOUR: Glucose, 1Hr PP: 95 mg/dL (ref 70–199)

## 2023-02-18 LAB — RPR: RPR Ser Ql: NONREACTIVE

## 2023-02-18 LAB — HIV ANTIBODY (ROUTINE TESTING W REFLEX): HIV Screen 4th Generation wRfx: NONREACTIVE

## 2023-02-25 ENCOUNTER — Encounter (HOSPITAL_COMMUNITY): Payer: Self-pay | Admitting: Obstetrics & Gynecology

## 2023-02-25 ENCOUNTER — Inpatient Hospital Stay (HOSPITAL_COMMUNITY)
Admission: AD | Admit: 2023-02-25 | Discharge: 2023-02-25 | Disposition: A | Discharge status: Home / Self Care | Payer: Medicaid Other | Attending: Obstetrics & Gynecology | Admitting: Obstetrics & Gynecology

## 2023-02-25 ENCOUNTER — Other Ambulatory Visit: Payer: Self-pay

## 2023-02-25 DIAGNOSIS — Z3689 Encounter for other specified antenatal screening: Secondary | ICD-10-CM | POA: Diagnosis not present

## 2023-02-25 DIAGNOSIS — O26893 Other specified pregnancy related conditions, third trimester: Secondary | ICD-10-CM | POA: Diagnosis present

## 2023-02-25 DIAGNOSIS — O09523 Supervision of elderly multigravida, third trimester: Secondary | ICD-10-CM | POA: Diagnosis not present

## 2023-02-25 DIAGNOSIS — O3433 Maternal care for cervical incompetence, third trimester: Secondary | ICD-10-CM | POA: Diagnosis not present

## 2023-02-25 DIAGNOSIS — Z3A28 28 weeks gestation of pregnancy: Secondary | ICD-10-CM | POA: Diagnosis not present

## 2023-02-25 DIAGNOSIS — R0981 Nasal congestion: Secondary | ICD-10-CM | POA: Diagnosis not present

## 2023-02-25 DIAGNOSIS — O368130 Decreased fetal movements, third trimester, not applicable or unspecified: Secondary | ICD-10-CM

## 2023-02-25 LAB — URINALYSIS, ROUTINE W REFLEX MICROSCOPIC
Bacteria, UA: NONE SEEN
Bilirubin Urine: NEGATIVE
Glucose, UA: NEGATIVE mg/dL
Hgb urine dipstick: NEGATIVE
Ketones, ur: NEGATIVE mg/dL
Nitrite: NEGATIVE
Protein, ur: NEGATIVE mg/dL
Specific Gravity, Urine: 1.002 — ABNORMAL LOW (ref 1.005–1.030)
pH: 7 (ref 5.0–8.0)

## 2023-02-25 MED ORDER — FLUTICASONE PROPIONATE 50 MCG/ACT NA SUSP
2.0000 | Freq: Once | NASAL | Status: AC
  Administered 2023-02-25: 2 via NASAL
  Filled 2023-02-25: qty 16

## 2023-02-25 NOTE — MAU Provider Note (Signed)
Chief Complaint:  Decreased Fetal Movement   Event Date/Time   First Provider Initiated Contact with Patient 02/25/23 0100   HPI: Andrea Logan is a 37 y.o. Z6X0960 at 67w3dwho presents to maternity admissions reporting decreased fetal movement. Has also had some nasal congestion. Has a cerclage in place but has not felt contractions or pressure. She denies LOF, vaginal bleeding, vaginal itching/burning, urinary symptoms, h/a, dizziness, n/v, diarrhea, constipation or fever/chills.  She denies headache, visual changes or RUQ abdominal pain.  Other This is a new problem. The current episode started today. The problem has been rapidly improving (baby started moving when put on monitor). Pertinent negatives include no abdominal pain, chills, fever or nausea. Nothing aggravates the symptoms. Treatments tried: Usual measures to make baby move.   RN Note: Andrea Logan is a 54 y.o. at [redacted]w[redacted]d here in MAU reporting: been feeling sick the past couple of days with cold symptoms. Today noticed FM has been less than normal - tried drinking cold water tonight to get baby to move and has not felt anything. Reports last movement was earlier this afternoon. States this is usually baby's active time. Denies pain, VB, or LOF. Reports cerclage is in place for incompetent cervix.   Past Medical History: Past Medical History:  Diagnosis Date   Alpha thalassemia silent carrier 10/14/2020   Anemia    Depression 08/24/2020   Trichomonal vaginitis 07/13/2021   Vaginal Pap smear, abnormal     Past obstetric history: OB History  Gravida Para Term Preterm AB Living  6 4 4   1 5   SAB IAB Ectopic Multiple Live Births  1     1 5     # Outcome Date GA Lbr Len/2nd Weight Sex Type Anes PTL Lv  6 Current           5A Term 03/16/21 [redacted]w[redacted]d 04:24 / 00:05 2950 g M Vag-Spont EPI  LIV  5B Term 03/16/21 [redacted]w[redacted]d 04:24 / 00:21 2190 g F CS-LTranv EPI  LIV  4 Term 11/25/13 [redacted]w[redacted]d  3459 g M Vag-Spont   LIV  3 Term 04/20/12 [redacted]w[redacted]d  3402  g M Vag-Spont  Y LIV  2 Term 06/20/10 [redacted]w[redacted]d  4479 g F Vag-Spont   LIV  1 SAB 02/26/10 [redacted]w[redacted]d           Past Surgical History: Past Surgical History:  Procedure Laterality Date   CERVICAL CERCLAGE     CERVICAL CERCLAGE N/A 10/08/2020   Procedure: CERCLAGE CERVICAL;  Surgeon: Adam Phenix, MD;  Location: [facility];  Service: Gynecology;  Laterality: N/A;   CERVICAL CERCLAGE N/A 11/08/2022   Procedure: CERCLAGE CERVICAL;  Surgeon: Adam Phenix, MD;  Location: [facility] LD ORS;  Service: Gynecology;  Laterality: N/A;   CERVICAL CONE BIOPSY     CESAREAN SECTION N/A 03/16/2021   Procedure: CESAREAN SECTION;  Surgeon: Venora Maples, MD;  Location: [facility] LD ORS;  Service: Obstetrics;  Laterality: N/A;    Family History: Family History  Problem Relation Age of Onset   Hypertension Mother    Diabetes Mother    Dementia Father    Cervical cancer Maternal Aunt     Social History: Social History   Tobacco Use   Smoking status: Never   Smokeless tobacco: Never  Vaping Use   Vaping status: Never Used  Substance Use Topics   Alcohol use: No    Alcohol/week: 0.0 standard drinks of alcohol   Drug use: Never    Allergies: No Known Allergies  Meds:  Medications Prior to Admission  Medication Sig Dispense Refill Last Dose   aspirin 81 MG chewable tablet Chew 1 tablet (81 mg total) by mouth daily. 30 tablet 5 02/23/2023   Prenatal Vit-Fe Fumarate-FA (PRENATAL VITAMINS) 28-0.8 MG TABS Take 1 tablet by mouth daily. 30 tablet 11 02/23/2023    I have reviewed patient's Past Medical Hx, Surgical Hx, Family Hx, Social Hx, medications and allergies.   ROS:  Review of Systems  Constitutional:  Negative for chills and fever.  Gastrointestinal:  Negative for abdominal pain and nausea.   Other systems negative  Physical Exam  Patient Vitals for the past 24 hrs:  BP Temp Temp src Pulse Resp SpO2 Height Weight  02/25/23 0054 110/68 98.2 F (36.8 C) Oral 91 19 99 % 5\' 9"  (1.753 m) 112.4 kg    Constitutional: Well-developed, well-nourished female in no acute distress.  Cardiovascular: normal rate  Respiratory: normal effort GI: Abd soft, non-tender, gravid appropriate for gestational age.   No rebound or guarding. MS: Extremities nontender, no edema, normal ROM Neurologic: Alert and oriented x 4.  GU: Neg CVAT.   FHT:  Baseline 140 , moderate variability, accelerations present, no decelerations Contractions:  Rare   Labs: Results for orders placed or performed during the hospital encounter of 02/25/23 (from the past 24 hour(s))  Urinalysis, Routine w reflex microscopic -Urine, Clean Catch     Status: Abnormal   Collection Time: 02/25/23  1:01 AM  Result Value Ref Range   Color, Urine STRAW (A) YELLOW   APPearance CLEAR CLEAR   Specific Gravity, Urine 1.002 (L) 1.005 - 1.030   pH 7.0 5.0 - 8.0   Glucose, UA NEGATIVE NEGATIVE mg/dL   Hgb urine dipstick NEGATIVE NEGATIVE   Bilirubin Urine NEGATIVE NEGATIVE   Ketones, ur NEGATIVE NEGATIVE mg/dL   Protein, ur NEGATIVE NEGATIVE mg/dL   Nitrite NEGATIVE NEGATIVE   Leukocytes,Ua TRACE (A) NEGATIVE   RBC / HPF 0-5 0 - 5 RBC/hpf   WBC, UA 0-5 0 - 5 WBC/hpf   Bacteria, UA NONE SEEN NONE SEEN   Squamous Epithelial / HPF 0-5 0 - 5 /HPF   A/Positive/-- (06/18 1424)  Imaging:    MAU Course/MDM: I have reviewed the triage vital signs and the nursing notes.   Pertinent labs & imaging results that were available during my care of the patient were reviewed by me and considered in my medical decision making (see chart for details).      I have reviewed her medical records including past results, notes and treatments.   NST reviewed, fetus active, Tracing reactive  Treatments in MAU included Flonase for nasal congestion.    Assessment: Single IUP at [redacted]w[redacted]d Decreased fetal movement, resolved Reactive FHR tracing Nasal congestion  Plan: Discharge home Flonase prn congestion Labor precautions and fetal kick  counts Follow up in Office for prenatal visits and recheck Encouraged to return if she develops worsening of symptoms, increase in pain, fever, or other concerning symptoms.   Pt stable at time of discharge.  Wynelle Bourgeois CNM, MSN Certified Nurse-Midwife 02/25/2023 1:46 AM

## 2023-02-25 NOTE — MAU Note (Signed)
.  Andrea Logan is a 37 y.o. at [redacted]w[redacted]d here in MAU reporting: been feeling sick the past couple of days with cold symptoms. Today noticed FM has been less than normal - tried drinking cold water tonight to get baby to move and has not felt anything. Reports last movement was earlier this afternoon. States this is usually baby's active time. Denies pain, VB, or LOF. Reports cerclage is in place for incompetent cervix.   Onset of complaint: today  Pain score: 0 Vitals:   02/25/23 0054  BP: 110/68  Pulse: 91  Resp: 19  Temp: 98.2 F (36.8 C)  SpO2: 99%     FHT:135 Lab orders placed from triage:  UA

## 2023-03-01 ENCOUNTER — Ambulatory Visit: Payer: Medicaid Other | Admitting: Obstetrics and Gynecology

## 2023-03-01 ENCOUNTER — Encounter: Payer: Self-pay | Admitting: Obstetrics and Gynecology

## 2023-03-01 VITALS — BP 102/65 | HR 96 | Wt 246.6 lb

## 2023-03-01 DIAGNOSIS — O09523 Supervision of elderly multigravida, third trimester: Secondary | ICD-10-CM | POA: Diagnosis not present

## 2023-03-01 DIAGNOSIS — O09292 Supervision of pregnancy with other poor reproductive or obstetric history, second trimester: Secondary | ICD-10-CM

## 2023-03-01 DIAGNOSIS — O0990 Supervision of high risk pregnancy, unspecified, unspecified trimester: Secondary | ICD-10-CM

## 2023-03-01 DIAGNOSIS — O3432 Maternal care for cervical incompetence, second trimester: Secondary | ICD-10-CM

## 2023-03-01 DIAGNOSIS — O3433 Maternal care for cervical incompetence, third trimester: Secondary | ICD-10-CM

## 2023-03-01 DIAGNOSIS — D563 Thalassemia minor: Secondary | ICD-10-CM

## 2023-03-01 DIAGNOSIS — O09522 Supervision of elderly multigravida, second trimester: Secondary | ICD-10-CM

## 2023-03-01 DIAGNOSIS — Z98891 History of uterine scar from previous surgery: Secondary | ICD-10-CM | POA: Diagnosis not present

## 2023-03-01 DIAGNOSIS — Z6835 Body mass index (BMI) 35.0-35.9, adult: Secondary | ICD-10-CM

## 2023-03-01 DIAGNOSIS — O09293 Supervision of pregnancy with other poor reproductive or obstetric history, third trimester: Secondary | ICD-10-CM

## 2023-03-01 DIAGNOSIS — Z3A29 29 weeks gestation of pregnancy: Secondary | ICD-10-CM

## 2023-03-01 NOTE — Progress Notes (Addendum)
   PRENATAL VISIT NOTE  Subjective:  Andrea Logan is a 37 y.o. [id number] at [redacted]w[redacted]d being seen today for ongoing prenatal care.  She is currently monitored for the following issues for this high-risk pregnancy and has AMA (advanced maternal age) multigravida 35+; BMI 35.0-35.9,adult; Obesity due to excess calories without serious comorbidity; Supervision of high risk pregnancy, antepartum; History of cesarean section; History of cervical incompetence in pregnancy, currently pregnant, second trimester; Cervical cerclage suture present; and Alpha thalassemia silent carrier on their problem list.  Patient reports vomiting.  Contractions: Irritability. Vag. Bleeding: None.  Movement: Present. Denies leaking of fluid.   The following portions of the patient's history were reviewed and updated as appropriate: allergies, current medications, past family history, past medical history, past social history, past surgical history and problem list.   Objective:   Vitals:   03/01/23 1400  BP: 102/65  Pulse: 96  Weight: 246 lb 9.6 oz (111.9 kg)    Fetal Status: Fetal Heart Rate (bpm): 131 Fundal Height: 29 cm Movement: Present     General:  Alert, oriented and cooperative. Patient is in no acute distress.  Skin: Skin is warm and dry. No rash noted.   Cardiovascular: Normal heart rate noted  Respiratory: Normal respiratory effort, no problems with respiration noted  Abdomen: Soft, gravid, appropriate for gestational age.  Pain/Pressure: Present     Pelvic: Cervical exam deferred        Extremities: Normal range of motion.  Edema: Trace  Mental Status: Normal mood and affect. Normal behavior. Normal judgment and thought content.   Assessment and Plan:  Pregnancy: A5W0981 at [redacted]w[redacted]d 1. Supervision of high risk pregnancy, antepartum Doing well. Discussed wnl 28 week labs. Taking iron and encouraged to continue this. She thinks this is her last pregnancy, however does not feel sure enough to want a tubal  ligation. Patient declines TDaP. Educated on importance.  2. Cervical cerclage suture present in second trimester No VB, pelvic pain, ctx. Following with MFM for U/S.  3. Multigravida of advanced maternal age in second trimester  4. BMI 35.0-35.9,adult  5. History of cesarean section Desires TOLAC  6. History of cervical incompetence in pregnancy, currently pregnant, second trimester Cerclage in place  7. Alpha thalassemia silent carrier  Preterm labor symptoms and general obstetric precautions including but not limited to vaginal bleeding, contractions, leaking of fluid and fetal movement were reviewed in detail with the patient.  Please refer to After Visit Summary for other counseling recommendations.   Return in about 3 weeks (around 03/22/2023) for ROB.  Future Appointments  Date Time Provider Department Center  03/23/2023 10:30 AM [facility]  03/29/2023  1:50 PM Constant, Gigi Gin, MD [facility] None    Joanne Gavel, MD OB Fellow

## 2023-03-01 NOTE — Progress Notes (Signed)
Pt presents for ROB visit. No concerns.  

## 2023-03-23 ENCOUNTER — Other Ambulatory Visit: Payer: Self-pay

## 2023-03-23 ENCOUNTER — Ambulatory Visit: Payer: Medicaid Other | Attending: Obstetrics and Gynecology

## 2023-03-23 DIAGNOSIS — O3432 Maternal care for cervical incompetence, second trimester: Secondary | ICD-10-CM | POA: Insufficient documentation

## 2023-03-23 DIAGNOSIS — Z3A32 32 weeks gestation of pregnancy: Secondary | ICD-10-CM

## 2023-03-23 DIAGNOSIS — O09522 Supervision of elderly multigravida, second trimester: Secondary | ICD-10-CM | POA: Insufficient documentation

## 2023-03-23 DIAGNOSIS — D563 Thalassemia minor: Secondary | ICD-10-CM

## 2023-03-23 DIAGNOSIS — O99013 Anemia complicating pregnancy, third trimester: Secondary | ICD-10-CM | POA: Diagnosis not present

## 2023-03-23 DIAGNOSIS — O3433 Maternal care for cervical incompetence, third trimester: Secondary | ICD-10-CM

## 2023-03-23 DIAGNOSIS — E669 Obesity, unspecified: Secondary | ICD-10-CM | POA: Diagnosis not present

## 2023-03-23 DIAGNOSIS — O34219 Maternal care for unspecified type scar from previous cesarean delivery: Secondary | ICD-10-CM | POA: Diagnosis not present

## 2023-03-23 DIAGNOSIS — O99212 Obesity complicating pregnancy, second trimester: Secondary | ICD-10-CM | POA: Diagnosis present

## 2023-03-23 DIAGNOSIS — O09293 Supervision of pregnancy with other poor reproductive or obstetric history, third trimester: Secondary | ICD-10-CM | POA: Diagnosis not present

## 2023-03-23 DIAGNOSIS — O99213 Obesity complicating pregnancy, third trimester: Secondary | ICD-10-CM

## 2023-03-23 DIAGNOSIS — O09523 Supervision of elderly multigravida, third trimester: Secondary | ICD-10-CM

## 2023-03-29 ENCOUNTER — Encounter: Payer: Self-pay | Admitting: Obstetrics and Gynecology

## 2023-03-29 ENCOUNTER — Ambulatory Visit (INDEPENDENT_AMBULATORY_CARE_PROVIDER_SITE_OTHER): Payer: Medicaid Other | Admitting: Obstetrics and Gynecology

## 2023-03-29 VITALS — BP 97/67 | HR 102 | Wt 244.0 lb

## 2023-03-29 DIAGNOSIS — Z98891 History of uterine scar from previous surgery: Secondary | ICD-10-CM

## 2023-03-29 DIAGNOSIS — Z3A33 33 weeks gestation of pregnancy: Secondary | ICD-10-CM

## 2023-03-29 DIAGNOSIS — O09523 Supervision of elderly multigravida, third trimester: Secondary | ICD-10-CM

## 2023-03-29 DIAGNOSIS — Z6836 Body mass index (BMI) 36.0-36.9, adult: Secondary | ICD-10-CM

## 2023-03-29 DIAGNOSIS — O0990 Supervision of high risk pregnancy, unspecified, unspecified trimester: Secondary | ICD-10-CM

## 2023-03-29 DIAGNOSIS — E66812 Obesity, class 2: Secondary | ICD-10-CM

## 2023-03-29 DIAGNOSIS — E6609 Other obesity due to excess calories: Secondary | ICD-10-CM

## 2023-03-29 DIAGNOSIS — O09292 Supervision of pregnancy with other poor reproductive or obstetric history, second trimester: Secondary | ICD-10-CM

## 2023-03-29 NOTE — Progress Notes (Signed)
ROB, wants to know when her Cerclage will be removed?

## 2023-03-29 NOTE — Progress Notes (Signed)
   PRENATAL VISIT NOTE  Subjective:  Andrea Logan is a 37 y.o. [id number] at [redacted]w[redacted]d being seen today for ongoing prenatal care.  She is currently monitored for the following issues for this high-risk pregnancy and has AMA (advanced maternal age) multigravida 35+; BMI 35.0-35.9,adult; Obesity due to excess calories without serious comorbidity; Supervision of high risk pregnancy, antepartum; History of cesarean section; History of cervical incompetence in pregnancy, currently pregnant, second trimester; Cervical cerclage suture present; and Alpha thalassemia silent carrier on their problem list.  Patient reports no complaints.  Contractions: Not present. Vag. Bleeding: None.  Movement: Present. Denies leaking of fluid.   The following portions of the patient's history were reviewed and updated as appropriate: allergies, current medications, past family history, past medical history, past social history, past surgical history and problem list.   Objective:   Vitals:   03/29/23 1416  BP: 97/67  Pulse: (!) 102  Weight: 244 lb (110.7 kg)    Fetal Status: Fetal Heart Rate (bpm): 159   Movement: Present     General:  Alert, oriented and cooperative. Patient is in no acute distress.  Skin: Skin is warm and dry. No rash noted.   Cardiovascular: Normal heart rate noted  Respiratory: Normal respiratory effort, no problems with respiration noted  Abdomen: Soft, gravid, appropriate for gestational age.  Pain/Pressure: Absent     Pelvic: Cervical exam deferred        Extremities: Normal range of motion.  Edema: None  Mental Status: Normal mood and affect. Normal behavior. Normal judgment and thought content.   Assessment and Plan:  Pregnancy: [id number] at [redacted]w[redacted]d 1. Supervision of high risk pregnancy, antepartum Patient is doing well without complaints  2. History of cervical incompetence in pregnancy, currently pregnant, second trimester Cerclage in situ  3. History of cesarean section Plans TOLAC-  consented previously  4. Class 2 obesity due to excess calories without serious comorbidity with body mass index (BMI) of 36.0 to 36.9 in adult   5. Multigravida of advanced maternal age in third trimester   Preterm labor symptoms and general obstetric precautions including but not limited to vaginal bleeding, contractions, leaking of fluid and fetal movement were reviewed in detail with the patient. Please refer to After Visit Summary for other counseling recommendations.   Return in about 2 weeks (around 04/12/2023) for in person, ROB, High risk.  No future appointments.  Catalina Antigua, MD

## 2023-04-12 ENCOUNTER — Encounter: Payer: Medicaid Other | Admitting: Family Medicine

## 2023-04-12 DIAGNOSIS — E6609 Other obesity due to excess calories: Secondary | ICD-10-CM

## 2023-04-12 DIAGNOSIS — O09523 Supervision of elderly multigravida, third trimester: Secondary | ICD-10-CM

## 2023-04-12 DIAGNOSIS — Z98891 History of uterine scar from previous surgery: Secondary | ICD-10-CM

## 2023-04-12 DIAGNOSIS — O0990 Supervision of high risk pregnancy, unspecified, unspecified trimester: Secondary | ICD-10-CM

## 2023-04-12 DIAGNOSIS — O09293 Supervision of pregnancy with other poor reproductive or obstetric history, third trimester: Secondary | ICD-10-CM

## 2023-04-12 DIAGNOSIS — Z3A35 35 weeks gestation of pregnancy: Secondary | ICD-10-CM

## 2023-04-19 ENCOUNTER — Encounter: Payer: Medicaid Other | Admitting: Obstetrics and Gynecology

## 2023-04-19 DIAGNOSIS — O0990 Supervision of high risk pregnancy, unspecified, unspecified trimester: Secondary | ICD-10-CM

## 2023-04-19 DIAGNOSIS — Z3A36 36 weeks gestation of pregnancy: Secondary | ICD-10-CM

## 2023-04-26 ENCOUNTER — Other Ambulatory Visit (HOSPITAL_COMMUNITY)
Admission: RE | Admit: 2023-04-26 | Discharge: 2023-04-26 | Disposition: A | Discharge status: Home / Self Care | Payer: Medicaid Other | Source: Ambulatory Visit | Attending: Obstetrics & Gynecology | Admitting: Obstetrics & Gynecology

## 2023-04-26 ENCOUNTER — Ambulatory Visit (INDEPENDENT_AMBULATORY_CARE_PROVIDER_SITE_OTHER): Payer: Medicaid Other | Admitting: Obstetrics & Gynecology

## 2023-04-26 VITALS — BP 111/69 | HR 92 | Wt 246.6 lb

## 2023-04-26 DIAGNOSIS — Z3A37 37 weeks gestation of pregnancy: Secondary | ICD-10-CM | POA: Diagnosis not present

## 2023-04-26 DIAGNOSIS — O09293 Supervision of pregnancy with other poor reproductive or obstetric history, third trimester: Secondary | ICD-10-CM

## 2023-04-26 DIAGNOSIS — O0990 Supervision of high risk pregnancy, unspecified, unspecified trimester: Secondary | ICD-10-CM | POA: Insufficient documentation

## 2023-04-26 DIAGNOSIS — O09292 Supervision of pregnancy with other poor reproductive or obstetric history, second trimester: Secondary | ICD-10-CM

## 2023-04-26 DIAGNOSIS — O0993 Supervision of high risk pregnancy, unspecified, third trimester: Secondary | ICD-10-CM

## 2023-04-26 DIAGNOSIS — O3433 Maternal care for cervical incompetence, third trimester: Secondary | ICD-10-CM | POA: Diagnosis not present

## 2023-04-26 NOTE — Progress Notes (Signed)
Patient ID: Andrea Logan, female   DOB: 1985/06/15, 37 y.o.   MRN: [id number] Cerclage visualized with speculum. Kelly forceps used to grasp the suture and cut with scissors and removed in entirety. Tolerated well  Adam Phenix, MD 04/26/2023

## 2023-04-26 NOTE — Progress Notes (Signed)
   PRENATAL VISIT NOTE  Subjective:  Andrea Logan is a 37 y.o. [id number] at [redacted]w[redacted]d being seen today for ongoing prenatal care.  She is currently monitored for the following issues for this high-risk pregnancy and has AMA (advanced maternal age) multigravida 35+; BMI 35.0-35.9,adult; Obesity due to excess calories without serious comorbidity; Supervision of high risk pregnancy, antepartum; History of cesarean section; History of cervical incompetence in pregnancy, currently pregnant, second trimester; Cervical cerclage suture present; and Alpha thalassemia silent carrier on their problem list.  Patient reports no complaints.  Contractions: Irregular. Vag. Bleeding: None.  Movement: Present. Denies leaking of fluid.   The following portions of the patient's history were reviewed and updated as appropriate: allergies, current medications, past family history, past medical history, past social history, past surgical history and problem list.   Objective:   Vitals:   04/26/23 1134  BP: 111/69  Pulse: 92  Weight: 246 lb 9.6 oz (111.9 kg)    Fetal Status: Fetal Heart Rate (bpm): 144   Movement: Present     General:  Alert, oriented and cooperative. Patient is in no acute distress.  Skin: Skin is warm and dry. No rash noted.   Cardiovascular: Normal heart rate noted  Respiratory: Normal respiratory effort, no problems with respiration noted  Abdomen: Soft, gravid, appropriate for gestational age.  Pain/Pressure: Present     Pelvic: Cervical exam performed in the presence of a chaperone        Extremities: Normal range of motion.  Edema: None  Mental Status: Normal mood and affect. Normal behavior. Normal judgment and thought content.   Assessment and Plan:  Pregnancy: A5W0981 at [redacted]w[redacted]d 1. Supervision of high risk pregnancy, antepartum [redacted]w[redacted]d  - Culture, beta strep (group b only) - Cervicovaginal ancillary only( [place])  2. [redacted] weeks gestation of pregnancy Cerclage removal today -  Culture, beta strep (group b only) - Cervicovaginal ancillary only( [place])  Term labor symptoms and general obstetric precautions including but not limited to vaginal bleeding, contractions, leaking of fluid and fetal movement were reviewed in detail with the patient. Please refer to After Visit Summary for other counseling recommendations.   Return in about 1 week (around 05/03/2023).  Future Appointments  Date Time Provider Department Center  05/03/2023  2:30 PM Joanne Gavel, MD [facility] None    Scheryl Darter, MD

## 2023-04-27 LAB — CERVICOVAGINAL ANCILLARY ONLY
Chlamydia: NEGATIVE
Comment: NEGATIVE
Comment: NORMAL
Neisseria Gonorrhea: NEGATIVE

## 2023-04-30 LAB — CULTURE, BETA STREP (GROUP B ONLY): Strep Gp B Culture: NEGATIVE

## 2023-05-03 ENCOUNTER — Encounter: Payer: Medicaid Other | Admitting: Obstetrics and Gynecology

## 2023-05-03 NOTE — Progress Notes (Deleted)
   PRENATAL VISIT NOTE  Subjective:  Andrea Logan is a 37 y.o. Z6X0960 at [redacted]w[redacted]d being seen today for ongoing prenatal care.  She is currently monitored for the following issues for this {Blank single:19197::"high-risk","low-risk"} pregnancy and has AMA (advanced maternal age) multigravida 35+; BMI 35.0-35.9,adult; Obesity due to excess calories without serious comorbidity; Supervision of high risk pregnancy, antepartum; History of cesarean section; History of cervical incompetence in pregnancy, currently pregnant, second trimester; Cervical cerclage suture present; and Alpha thalassemia silent carrier on their problem list.  Patient reports {sx:14538}.   .  .   . Denies leaking of fluid.   The following portions of the patient's history were reviewed and updated as appropriate: allergies, current medications, past family history, past medical history, past social history, past surgical history and problem list.   Objective:  There were no vitals filed for this visit.  Fetal Status:           General:  Alert, oriented and cooperative. Patient is in no acute distress.  Skin: Skin is warm and dry. No rash noted.   Cardiovascular: Normal heart rate noted  Respiratory: Normal respiratory effort, no problems with respiration noted  Abdomen: Soft, gravid, appropriate for gestational age.        Pelvic: {Blank single:19197::"Cervical exam performed in the presence of a chaperone","Cervical exam deferred"}        Extremities: Normal range of motion.     Mental Status: Normal mood and affect. Normal behavior. Normal judgment and thought content.   Assessment and Plan:  Pregnancy: A5W0981 at [redacted]w[redacted]d 1. [redacted] weeks gestation of pregnancy (Primary) ***  2. Supervision of high risk pregnancy, antepartum ***  3. History of cesarean section ***  4. Class 2 obesity due to excess calories without serious comorbidity with body mass index (BMI) of 36.0 to 36.9 in adult ***  5. Multigravida of advanced  maternal age in third trimester ***  6. History of cervical incompetence in pregnancy, currently pregnant, second trimester ***  {Blank single:19197::"Term","Preterm"} labor symptoms and general obstetric precautions including but not limited to vaginal bleeding, contractions, leaking of fluid and fetal movement were reviewed in detail with the patient. Please refer to After Visit Summary for other counseling recommendations.   No follow-ups on file.  Future Appointments  Date Time Provider Department Center  05/03/2023  2:30 PM Joanne Gavel, MD [facility] None    Joanne Gavel, MD

## 2023-05-08 ENCOUNTER — Encounter (HOSPITAL_COMMUNITY): Payer: Self-pay | Admitting: Obstetrics & Gynecology

## 2023-05-08 ENCOUNTER — Inpatient Hospital Stay (HOSPITAL_COMMUNITY): Payer: Medicaid Other | Admitting: Anesthesiology

## 2023-05-08 ENCOUNTER — Other Ambulatory Visit: Payer: Self-pay

## 2023-05-08 ENCOUNTER — Inpatient Hospital Stay (HOSPITAL_COMMUNITY)
Admission: AD | Admit: 2023-05-08 | Discharge: 2023-05-10 | DRG: 805 | Disposition: A | Discharge status: Home / Self Care | Payer: Medicaid Other | Attending: Obstetrics & Gynecology | Admitting: Obstetrics & Gynecology

## 2023-05-08 DIAGNOSIS — O99214 Obesity complicating childbirth: Secondary | ICD-10-CM | POA: Diagnosis present

## 2023-05-08 DIAGNOSIS — O4593 Premature separation of placenta, unspecified, third trimester: Secondary | ICD-10-CM | POA: Diagnosis present

## 2023-05-08 DIAGNOSIS — Z8249 Family history of ischemic heart disease and other diseases of the circulatory system: Secondary | ICD-10-CM | POA: Diagnosis not present

## 2023-05-08 DIAGNOSIS — Z98891 History of uterine scar from previous surgery: Secondary | ICD-10-CM

## 2023-05-08 DIAGNOSIS — Z148 Genetic carrier of other disease: Secondary | ICD-10-CM

## 2023-05-08 DIAGNOSIS — Z5982 Transportation insecurity: Secondary | ICD-10-CM | POA: Diagnosis not present

## 2023-05-08 DIAGNOSIS — Z3A38 38 weeks gestation of pregnancy: Secondary | ICD-10-CM | POA: Diagnosis not present

## 2023-05-08 DIAGNOSIS — O34219 Maternal care for unspecified type scar from previous cesarean delivery: Principal | ICD-10-CM

## 2023-05-08 DIAGNOSIS — Z7982 Long term (current) use of aspirin: Secondary | ICD-10-CM

## 2023-05-08 DIAGNOSIS — O34211 Maternal care for low transverse scar from previous cesarean delivery: Secondary | ICD-10-CM | POA: Diagnosis not present

## 2023-05-08 DIAGNOSIS — Z6835 Body mass index (BMI) 35.0-35.9, adult: Secondary | ICD-10-CM

## 2023-05-08 DIAGNOSIS — O09523 Supervision of elderly multigravida, third trimester: Secondary | ICD-10-CM | POA: Diagnosis not present

## 2023-05-08 DIAGNOSIS — Z833 Family history of diabetes mellitus: Secondary | ICD-10-CM

## 2023-05-08 DIAGNOSIS — O09529 Supervision of elderly multigravida, unspecified trimester: Secondary | ICD-10-CM

## 2023-05-08 DIAGNOSIS — O26893 Other specified pregnancy related conditions, third trimester: Secondary | ICD-10-CM | POA: Diagnosis not present

## 2023-05-08 LAB — CBC
HCT: 39.6 % (ref 36.0–46.0)
Hemoglobin: 13 g/dL (ref 12.0–15.0)
MCH: 28.7 pg (ref 26.0–34.0)
MCHC: 32.8 g/dL (ref 30.0–36.0)
MCV: 87.4 fL (ref 80.0–100.0)
Platelets: 195 10*3/uL (ref 150–400)
RBC: 4.53 MIL/uL (ref 3.87–5.11)
RDW: 14.8 % (ref 11.5–15.5)
WBC: 11.7 10*3/uL — ABNORMAL HIGH (ref 4.0–10.5)
nRBC: 0 % (ref 0.0–0.2)

## 2023-05-08 LAB — TYPE AND SCREEN
ABO/RH(D): A POS
Antibody Screen: NEGATIVE

## 2023-05-08 MED ORDER — WITCH HAZEL-GLYCERIN EX PADS
1.0000 | MEDICATED_PAD | CUTANEOUS | Status: DC | PRN
  Administered 2023-05-09: 1 via TOPICAL

## 2023-05-08 MED ORDER — OXYTOCIN-SODIUM CHLORIDE 30-0.9 UT/500ML-% IV SOLN: 2.5000 [IU]/h | INTRAVENOUS | Status: DC

## 2023-05-08 MED ORDER — OXYCODONE-ACETAMINOPHEN 5-325 MG PO TABS: 2.0000 | ORAL_TABLET | ORAL | Status: DC | PRN

## 2023-05-08 MED ORDER — LIDOCAINE HCL (PF) 1 % IJ SOLN
INTRAMUSCULAR | Status: DC | PRN
  Administered 2023-05-08: 5 mL via EPIDURAL
  Administered 2023-05-08: 4 mL via EPIDURAL

## 2023-05-08 MED ORDER — METHYLERGONOVINE MALEATE 0.2 MG PO TABS: 0.2000 mg | ORAL_TABLET | ORAL | Status: DC | PRN

## 2023-05-08 MED ORDER — OXYTOCIN 10 UNIT/ML IJ SOLN
100.0000 [IU] | Freq: Once | INTRAMUSCULAR | Status: AC
Start: 2023-05-08 — End: 2023-05-08

## 2023-05-08 MED ORDER — OXYTOCIN 10 UNIT/ML IJ SOLN
INTRAMUSCULAR | Status: AC
  Filled 2023-05-08: qty 1

## 2023-05-08 MED ORDER — ACETAMINOPHEN 325 MG PO TABS
650.0000 mg | ORAL_TABLET | ORAL | Status: DC | PRN
Start: 2023-05-08 — End: 2023-05-10
  Administered 2023-05-10: 650 mg via ORAL
  Filled 2023-05-08: qty 2

## 2023-05-08 MED ORDER — FLEET ENEMA RE ENEM: 1.0000 | ENEMA | RECTAL | Status: DC | PRN

## 2023-05-08 MED ORDER — FENTANYL CITRATE (PF) 100 MCG/2ML IJ SOLN
INTRAMUSCULAR | Status: AC
  Filled 2023-05-08: qty 2

## 2023-05-08 MED ORDER — SIMETHICONE 80 MG PO CHEW: 80.0000 mg | CHEWABLE_TABLET | ORAL | Status: DC | PRN

## 2023-05-08 MED ORDER — EPHEDRINE 5 MG/ML INJ: 10.0000 mg | INTRAVENOUS | Status: DC | PRN

## 2023-05-08 MED ORDER — METHYLERGONOVINE MALEATE 0.2 MG/ML IJ SOLN: 0.2000 mg | INTRAMUSCULAR | Status: DC | PRN

## 2023-05-08 MED ORDER — COCONUT OIL OIL: 1.0000 | TOPICAL_OIL | Status: DC | PRN

## 2023-05-08 MED ORDER — IBUPROFEN 600 MG PO TABS
600.0000 mg | ORAL_TABLET | Freq: Four times a day (QID) | ORAL | Status: DC
  Administered 2023-05-08 – 2023-05-10 (×7): 600 mg via ORAL
  Filled 2023-05-08 (×7): qty 1

## 2023-05-08 MED ORDER — OXYCODONE-ACETAMINOPHEN 5-325 MG PO TABS: 1.0000 | ORAL_TABLET | ORAL | Status: DC | PRN

## 2023-05-08 MED ORDER — OXYTOCIN-SODIUM CHLORIDE 30-0.9 UT/500ML-% IV SOLN
INTRAVENOUS | Status: AC
  Administered 2023-05-08: 333 mL via INTRAVENOUS
  Filled 2023-05-08: qty 500

## 2023-05-08 MED ORDER — LACTATED RINGERS IV SOLN
500.0000 mL | Freq: Once | INTRAVENOUS | Status: AC
  Administered 2023-05-08: 500 mL via INTRAVENOUS

## 2023-05-08 MED ORDER — BENZOCAINE-MENTHOL 20-0.5 % EX AERO
1.0000 | INHALATION_SPRAY | CUTANEOUS | Status: DC | PRN
  Administered 2023-05-09: 1 via TOPICAL
  Filled 2023-05-08 (×3): qty 56

## 2023-05-08 MED ORDER — PHENYLEPHRINE 80 MCG/ML (10ML) SYRINGE FOR IV PUSH (FOR BLOOD PRESSURE SUPPORT): 80.0000 ug | PREFILLED_SYRINGE | INTRAVENOUS | Status: DC | PRN

## 2023-05-08 MED ORDER — OXYTOCIN 10 UNIT/ML IJ SOLN
INTRAMUSCULAR | Status: AC
  Administered 2023-05-08: 100 [IU]
  Filled 2023-05-08: qty 1

## 2023-05-08 MED ORDER — MAGNESIUM HYDROXIDE 400 MG/5ML PO SUSP: 30.0000 mL | ORAL | Status: DC | PRN

## 2023-05-08 MED ORDER — OXYTOCIN 10 UNIT/ML IJ SOLN
INTRAMUSCULAR | Status: AC
  Filled 2023-05-08: qty 6

## 2023-05-08 MED ORDER — SOD CITRATE-CITRIC ACID 500-334 MG/5ML PO SOLN: 30.0000 mL | ORAL | Status: DC | PRN

## 2023-05-08 MED ORDER — SENNOSIDES-DOCUSATE SODIUM 8.6-50 MG PO TABS
2.0000 | ORAL_TABLET | ORAL | Status: DC
  Administered 2023-05-09: 2 via ORAL
  Filled 2023-05-08: qty 2

## 2023-05-08 MED ORDER — FENTANYL CITRATE (PF) 100 MCG/2ML IJ SOLN
50.0000 ug | Freq: Once | INTRAMUSCULAR | Status: AC
  Administered 2023-05-08: 50 ug via INTRAVENOUS

## 2023-05-08 MED ORDER — LACTATED RINGERS IV SOLN: 500.0000 mL | INTRAVENOUS | Status: DC | PRN

## 2023-05-08 MED ORDER — LACTATED RINGERS IV SOLN: INTRAVENOUS | Status: DC

## 2023-05-08 MED ORDER — DIBUCAINE (PERIANAL) 1 % EX OINT: 1.0000 | TOPICAL_OINTMENT | CUTANEOUS | Status: DC | PRN

## 2023-05-08 MED ORDER — TRANEXAMIC ACID-NACL 1000-0.7 MG/100ML-% IV SOLN
1000.0000 mg | INTRAVENOUS | Status: AC
  Administered 2023-05-08: 1000 mg via INTRAVENOUS
  Filled 2023-05-08: qty 100

## 2023-05-08 MED ORDER — PRENATAL MULTIVITAMIN CH
1.0000 | ORAL_TABLET | Freq: Every day | ORAL | Status: DC
  Administered 2023-05-10: 1 via ORAL
  Filled 2023-05-08 (×2): qty 1

## 2023-05-08 MED ORDER — ONDANSETRON HCL 4 MG PO TABS: 4.0000 mg | ORAL_TABLET | ORAL | Status: DC | PRN

## 2023-05-08 MED ORDER — OXYTOCIN BOLUS FROM INFUSION: 333.0000 mL | Freq: Once | INTRAVENOUS | Status: AC

## 2023-05-08 MED ORDER — ACETAMINOPHEN 325 MG PO TABS: 650.0000 mg | ORAL_TABLET | ORAL | Status: DC | PRN

## 2023-05-08 MED ORDER — FENTANYL-BUPIVACAINE-NACL 0.5-0.125-0.9 MG/250ML-% EP SOLN
12.0000 mL/h | EPIDURAL | Status: DC | PRN
  Administered 2023-05-08: 12 mL/h via EPIDURAL
  Filled 2023-05-08: qty 250

## 2023-05-08 MED ORDER — LIDOCAINE HCL (PF) 1 % IJ SOLN
30.0000 mL | INTRAMUSCULAR | Status: DC | PRN
  Filled 2023-05-08: qty 30

## 2023-05-08 MED ORDER — DIPHENHYDRAMINE HCL 50 MG/ML IJ SOLN: 12.5000 mg | INTRAMUSCULAR | Status: DC | PRN

## 2023-05-08 MED ORDER — DIPHENHYDRAMINE HCL 25 MG PO CAPS: 25.0000 mg | ORAL_CAPSULE | Freq: Four times a day (QID) | ORAL | Status: DC | PRN

## 2023-05-08 MED ORDER — ONDANSETRON HCL 4 MG/2ML IJ SOLN: 4.0000 mg | Freq: Four times a day (QID) | INTRAMUSCULAR | Status: DC | PRN

## 2023-05-08 MED ORDER — ONDANSETRON HCL 4 MG/2ML IJ SOLN: 4.0000 mg | INTRAMUSCULAR | Status: DC | PRN

## 2023-05-08 NOTE — Progress Notes (Signed)
Labor Progress Note Elisheba Gracia is a 37 y.o. [id number] at [redacted]w[redacted]d presented for SOL in active phase.   S:  More comfortable following epidural placement, but feeling pressure  O:  BP (!) 89/47   Pulse (!) 117   Temp 98 F (36.7 C) (Oral)   Resp 18   Ht 5\' 9"  (1.753 m)   Wt 112 kg   LMP 08/10/2022   SpO2 94%   Breastfeeding Unknown   BMI 36.48 kg/m  EFM: baseline 160 bpm/ mod variability/ 15x15 accels/ absent decels  Toco/IUPC: 2-4 SVE: Dilation: 9.5 Dilation Complete Date: 05/08/23 Dilation Complete Time: 1440 Effacement (%): 90 Station: Plus 1 Presentation: Vertex Exam by:: Lamont Snowball CNM Pitocin: 0 mu/min  A/P: 37 y.o. A5W0981 [redacted]w[redacted]d here in active labor with spontaneous onset.   1. Labor: Active/transitional stage of labor. Patient feels urge to push but is 9.5 cm. RBA of AROM discussed, patient wishes to proceed with procedure. Moderate amount of light meconium tinged fluid noted. Tolerated well. 2. FWB: Cat 1 3. Pain: More comfortable following epidural placement.     Anticipate NVSB.  Richardson Landry, CNM 6:43 PM

## 2023-05-08 NOTE — Anesthesia Procedure Notes (Signed)
Epidural Patient location during procedure: OB Start time: 05/08/2023 1:34 PM End time: 05/08/2023 1:37 PM  Staffing Anesthesiologist: Beryle Lathe, MD Performed: anesthesiologist   Preanesthetic Checklist Completed: patient identified, IV checked, risks and benefits discussed, monitors and equipment checked, pre-op evaluation and timeout performed  Epidural Patient position: sitting Prep: DuraPrep Patient monitoring: continuous pulse ox and blood pressure Approach: midline Location: L2-L3 Injection technique: LOR saline  Needle:  Needle type: Tuohy  Needle gauge: 17 G Needle length: 9 cm Needle insertion depth: 8 cm Catheter size: 19 Gauge Catheter at skin depth: 13 cm Test dose: negative and Other (1% lidocaine)  Assessment Events: blood not aspirated and no cerebrospinal fluid  Additional Notes Patient identified. Risks including, but not limited to, bleeding, infection, nerve damage, paralysis, inadequate analgesia, blood pressure changes, nausea, vomiting, allergic reaction, postpartum back pain, itching, and headache were discussed. Patient expressed understanding and wished to proceed. Sterile prep and drape, including hand hygiene, mask, and sterile gloves were used. The patient was positioned and the spine was prepped. The skin was anesthetized with lidocaine. No paraesthesia or other complication noted. The patient did not experience any signs of intravascular injection such as tinnitus or metallic taste in mouth, nor signs of intrathecal spread such as rapid motor block. Please see nursing notes for vital signs. The patient tolerated the procedure well.   Leslye Peer, MDReason for block:procedure for pain

## 2023-05-08 NOTE — Discharge Summary (Signed)
Postpartum Discharge Summary  Date of Service updated***     Patient Name: Andrea Logan DOB: [birth year] MRN: [id number]  Date of admission: 05/08/2023 Delivery date:05/08/2023 Delivering provider: Lamont Snowball A Date of discharge: 05/08/2023  Admitting diagnosis: Preterm delivery without spontaneous labor [O60.10X0] Intrauterine pregnancy: [redacted]w[redacted]d     Secondary diagnosis:  Principal Problem:   Preterm delivery without spontaneous labor  Additional problems: ***    Discharge diagnosis: Term Pregnancy Delivered and VBAC                                              Post partum procedures:{Postpartum procedures:23558} Augmentation: AROM Complications: Delayed placental separation  Hospital course: Onset of Labor With Vaginal Delivery      37 y.o. yo G4W1027 at [redacted]w[redacted]d was admitted in Active Labor on 05/08/2023. Labor course was uncomplicated  Membrane Rupture Time/Date: 2:45 PM,05/08/2023  Delivery Method:VBAC, Spontaneous Operative Delivery:N/A Episiotomy: None Lacerations:  1st degree;Labial Patient had a postpartum course notable for delayed placental separation, resolved after intra-umbilical vein oxytocin injection.  She is ambulating, tolerating a regular diet, passing flatus, and urinating well. Patient is discharged home in stable condition on 05/08/23.  Newborn Data: Birth date:05/08/2023 Birth time:3:06 PM Gender:Female Living status:Living Apgars:9 ,10  Weight:3290 g  Magnesium Sulfate received: No BMZ received: No Rhophylac:N/A MMR:N/A T-DaP:No Flu: No RSV Vaccine received: No Transfusion:{Transfusion received:30440034}  Immunizations received: Immunization History  Administered Date(s) Administered   PPD Test 09/09/2014   Tdap 01/22/2020    Physical exam  Vitals:   05/08/23 1645 05/08/23 1700 05/08/23 1705 05/08/23 1720  BP: 131/75 (!) 118/101 136/69 (!) 89/47  Pulse: 91 (!) 178 (!) 129 (!) 117  Resp:      Temp:      TempSrc:      SpO2:       Weight:      Height:       General: {Exam; general:21111117} Lochia: {Desc; appropriate/inappropriate:30686::"appropriate"} Uterine Fundus: {Desc; firm/soft:30687} Incision: {Exam; incision:21111123} DVT Evaluation: {Exam; dvt:2111122} Labs: Lab Results  Component Value Date   WBC 11.7 (H) 05/08/2023   HGB 13.0 05/08/2023   HCT 39.6 05/08/2023   MCV 87.4 05/08/2023   PLT 195 05/08/2023      Latest Ref Rng & Units 11/02/2022    2:24 PM  CMP  Glucose 70 - 99 mg/dL 89   BUN 6 - 20 mg/dL 8   Creatinine 2.53 - 6.64 mg/dL 4.03   Sodium 474 - 259 mmol/L 141   Potassium 3.5 - 5.2 mmol/L 4.1   Chloride 96 - 106 mmol/L 110   CO2 20 - 29 mmol/L 17   Calcium 8.7 - 10.2 mg/dL 56.3   Total Protein 6.0 - 8.5 g/dL 7.3   Total Bilirubin 0.0 - 1.2 mg/dL 0.3   Alkaline Phos 44 - 121 IU/L 57   AST 0 - 40 IU/L 12   ALT 0 - 32 IU/L 14    Edinburgh Score:    04/14/2021   11:38 AM  Edinburgh Postnatal Depression Scale Screening Tool  I have been able to laugh and see the funny side of things. 0  I have looked forward with enjoyment to things. 0  I have blamed myself unnecessarily when things went wrong. 1  I have been anxious or worried for no good reason. 0  I have felt scared or panicky for no good reason. 0  Things have been getting on top of me. 1  I have been so unhappy that I have had difficulty sleeping. 0  I have felt sad or miserable. 0  I have been so unhappy that I have been crying. 0  The thought of harming myself has occurred to me. 0  Edinburgh Postnatal Depression Scale Total 2   No data recorded  After visit meds:  Allergies as of 05/08/2023   No Known Allergies   Med Rec must be completed prior to using this [facility]***        Discharge home in stable condition Infant Feeding: Bottle and Breast Infant Disposition:home with mother Discharge instruction: per After Visit Summary and Postpartum booklet. Activity: Advance as tolerated. Pelvic rest for 6  weeks.  Diet: routine diet Future Appointments: Future Appointments  Date Time Provider Department Center  05/12/2023 10:35 AM Sundra Aland, MD [facility] None   Follow up Visit:   Message sent by Paulino Door, MD  Please schedule this patient for Postpartum visit in: 6 weeks with the following provider: Any provider In-Person High risk pregnancy complicated by: AMA, obesity, h/o CS Delivery mode:  VBAC, Spontaneous Anticipated Birth Control:  Copper IUD PP Procedures needed: None  Edinburgh: negative Schedule Integrated BH visit: no   05/08/2023 [name], MD

## 2023-05-08 NOTE — H&P (Addendum)
Attestation of Supervision of Student:  I confirm that I have verified the information documented in the medical resident's note and that I have also personally supervised the history, physical exam and all medical decision making activities.  I have verified that all services and findings are accurately documented in this student's note; and I agree with management and plan as outlined in the documentation. I have also made any necessary editorial changes.  Richardson Landry, CNM [facility] [employer], [facility] 05/08/2023 7:55 PM  OBSTETRIC ADMISSION HISTORY AND PHYSICAL  Andrea Logan is a 37 y.o. female [id number] with IUP at [redacted]w[redacted]d by L/10 presenting for spontaneous labor. She reports +FMs, No LOF, no VB, no blurry vision, headaches or peripheral edema, and RUQ pain.  She plans on breast and bottle feeding. She plans on copper IUD postpartum for birth control. She received her prenatal care at  [facility]    Dating: By LMP --->  Estimated Date of Delivery: 05/17/23  Sono:   @[redacted]w[redacted]d , CWD, normal anatomy, cephalic presentation, anterior placenta, Est. FW: 2313 gm 5 lb 2 oz 91 %    Prenatal History/Complications: h/o prior cesarean, AMA, obesity, h/o cervical incompetence s/p cerclage  Past Medical History: Past Medical History:  Diagnosis Date   Alpha thalassemia silent carrier 10/14/2020   Anemia    Depression 08/24/2020   Trichomonal vaginitis 07/13/2021   Vaginal Pap smear, abnormal     Past Surgical History: Past Surgical History:  Procedure Laterality Date   CERVICAL CERCLAGE     CERVICAL CERCLAGE N/A 10/08/2020   Procedure: CERCLAGE CERVICAL;  Surgeon: Adam Phenix, MD;  Location: [facility] LD ORS;  Service: Gynecology;  Laterality: N/A;   CERVICAL CERCLAGE N/A 11/08/2022   Procedure: CERCLAGE CERVICAL;  Surgeon: Adam Phenix, MD;  Location: [facility] LD ORS;  Service: Gynecology;  Laterality: N/A;   CERVICAL CONE BIOPSY     CESAREAN SECTION N/A 03/16/2021    Procedure: CESAREAN SECTION;  Surgeon: Venora Maples, MD;  Location: [facility] LD ORS;  Service: Obstetrics;  Laterality: N/A;    Obstetrical History: OB History     Gravida  6   Para  4   Term  4   Preterm      AB  1   Living  5      SAB  1   IAB      Ectopic      Multiple  1   Live Births  5           Social History Social History   Socioeconomic History   Marital status: Single    Spouse name: Not on file   Number of children: 3   Years of education: Not on file   Highest education level: Associate degree: occupational, Scientist, product/process development, or vocational program  Occupational History   Not on file  Tobacco Use   Smoking status: Never   Smokeless tobacco: Never  Vaping Use   Vaping status: Never Used  Substance and Sexual Activity   Alcohol use: No    Alcohol/week: 0.0 standard drinks of alcohol   Drug use: Never   Sexual activity: Not Currently    Birth control/protection: None  Other Topics Concern   Not on file  Social History Narrative   Not on file   Social Drivers of Health   Financial Resource Strain: Low Risk  (07/12/2022)   Received from [facility], [facility]   Overall Financial Resource Strain (CARDIA)    Difficulty  of Paying Living Expenses: Not hard at all  Food Insecurity: No Food Insecurity (05/08/2023)   Hunger Vital Sign    Worried About Running Out of Food in the Last Year: Never true    Ran Out of Food in the Last Year: Never true  Transportation Needs: Unmet Transportation Needs (05/08/2023)   PRAPARE - Administrator, Civil Service (Medical): Yes    Lack of Transportation (Non-Medical): Yes  Physical Activity: Not on file  Stress: Not on file  Social Connections: Unknown (07/05/2022)   Received from [employer], [facility]   Social Network    Social Network: Not on file    Family History: Family History  Problem Relation Age of Onset   Hypertension Mother    Diabetes Mother    Dementia Father     Cervical cancer Maternal Aunt     Allergies: No Known Allergies  Medications Prior to Admission  Medication Sig Dispense Refill Last Dose/Taking   aspirin 81 MG chewable tablet Chew 1 tablet (81 mg total) by mouth daily. 30 tablet 5 05/08/2023   Prenatal Vit-Fe Fumarate-FA (PRENATAL VITAMINS) 28-0.8 MG TABS Take 1 tablet by mouth daily. 30 tablet 11 05/08/2023     Review of Systems   All systems reviewed and negative except as stated in HPI  Blood pressure (!) 113/56, pulse 88, temperature 98 F (36.7 C), temperature source Oral, resp. rate 20, height 5\' 9"  (1.753 m), weight 112 kg, last menstrual period 08/10/2022, currently breastfeeding. General appearance: alert, cooperative, and mild distress Lungs: clear to auscultation bilaterally Heart: regular rate and rhythm Abdomen: soft, non-tender; bowel sounds normal Pelvic: SVE 8.5/90/-2 Extremities: Homans sign is negative, no sign of DVT Presentation: cephalic Fetal monitoring: Baseline: 135 bpm, Variability: Good {> 6 bpm), Accelerations: Reactive, and Decelerations: Absent Uterine activity: Moderate, q3-5 min Dilation: 8.5 Effacement (%): 90 Station: 0 Exam by:: Gerarda Gunther, RN   Prenatal labs: ABO, Rh: --/--/PENDING (12/22 1250) Antibody: PENDING (12/22 1250) Rubella: 3.06 (06/18 1424) RPR: Non Reactive (10/03 1052)  HBsAg: Negative (06/18 1424)  HIV: Non Reactive (10/03 1052)  GBS: Negative/-- (12/10 1348)  1 hr Glucola wnl Genetic screening  low risk Anatomy US normal  Prenatal Transfer Tool  Maternal Diabetes: No Genetic Screening: Normal Maternal Ultrasounds/Referrals: Normal Fetal Ultrasounds or other Referrals:  None Maternal Substance Abuse:  No Significant Maternal Medications:  None Significant Maternal Lab Results:  None Number of Prenatal Visits:greater than 3 verified prenatal visits Other Comments:  None  Results for orders placed or performed during the hospital encounter of 05/08/23 (from  the past 24 hours)  Type and screen [facility]   Collection Time: 05/08/23 12:50 PM  Result Value Ref Range   ABO/RH(D) PENDING    Antibody Screen PENDING    Sample Expiration      05/11/2023,2359 Performed at [facility], [address], [place], [place] [address]   CBC   Collection Time: 05/08/23 12:53 PM  Result Value Ref Range   WBC 11.7 (H) 4.0 - 10.5 K/uL   RBC 4.53 3.87 - 5.11 MIL/uL   Hemoglobin 13.0 12.0 - 15.0 g/dL   HCT 08.6 57.8 - 46.9 %   MCV 87.4 80.0 - 100.0 fL   MCH 28.7 26.0 - 34.0 pg   MCHC 32.8 30.0 - 36.0 g/dL   RDW 62.9 52.8 - 41.3 %   Platelets 195 150 - 400 K/uL   nRBC 0.0 0.0 - 0.2 %  Patient Active Problem List   Diagnosis Date Noted   Preterm delivery without spontaneous labor 05/08/2023   Cervical cerclage suture present 12/17/2022   Alpha thalassemia silent carrier 12/17/2022   History of cesarean section 11/02/2022   History of cervical incompetence in pregnancy, currently pregnant, second trimester 11/02/2022   Supervision of high risk pregnancy, antepartum 09/22/2022   Obesity due to excess calories without serious comorbidity 07/06/2022   BMI 35.0-35.9,adult 07/05/2022   AMA (advanced maternal age) multigravida 35+ 11/24/2020    Assessment/Plan:  Andrea Logan is a 37 y.o. [id number] at [redacted]w[redacted]d here for spontaneous labor  #Labor: Progressing well, arrived in active phase. Plan to reevaluate after pain control. Anticipate VBAC.  #Pain: Plan to have epidural placed for comfort.  #FWB: Cat 1 tracing #ID:  GBS Neg #MOF: Breast & Bottle #MOC: Copper IUD postpartum #Circ:  yes  [name], MD  05/08/2023, 1:40 PM

## 2023-05-08 NOTE — Lactation Note (Signed)
This note was copied from a baby's chart. Lactation Consultation Note  Patient Name: Andrea Logan Today's Date: 05/08/2023 Age:37 hours  Mom decline the need for Lactation services. Mom stated "this is number 6. I've got this".   Maternal Data    Feeding    LATCH Score                    Lactation Tools Discussed/Used    Interventions    Discharge    Consult Status Consult Status: Complete    Rae Plotner G 05/08/2023, 11:14 PM

## 2023-05-08 NOTE — Anesthesia Preprocedure Evaluation (Signed)
Anesthesia Evaluation  Patient identified by MRN, date of birth, ID band Patient awake    Reviewed: Allergy & Precautions, NPO status , Patient's Chart, lab work & pertinent test results  History of Anesthesia Complications Negative for: history of anesthetic complications  Airway Mallampati: II   Neck ROM: Full    Dental   Pulmonary neg pulmonary ROS   Pulmonary exam normal        Cardiovascular negative cardio ROS Normal cardiovascular exam     Neuro/Psych  PSYCHIATRIC DISORDERS  Depression    negative neurological ROS     GI/Hepatic negative GI ROS, Neg liver ROS,,,  Endo/Other   Obesity   Renal/GU negative Renal ROS     Musculoskeletal negative musculoskeletal ROS (+)    Abdominal   Peds  Hematology  Alpha thalassemia silent carrier    Anesthesia Other Findings   Reproductive/Obstetrics (+) Pregnancy                             Anesthesia Physical Anesthesia Plan  ASA: 2  Anesthesia Plan: Epidural   Post-op Pain Management:    Induction:   PONV Risk Score and Plan: 2 and Treatment may vary due to age or medical condition  Airway Management Planned: Natural Airway  Additional Equipment: None  Intra-op Plan:   Post-operative Plan:   Informed Consent: I have reviewed the patients History and Physical, chart, labs and discussed the procedure including the risks, benefits and alternatives for the proposed anesthesia with the patient or authorized representative who has indicated his/her understanding and acceptance.       Plan Discussed with: Anesthesiologist  Anesthesia Plan Comments: (Labs reviewed. Platelets acceptable, patient not taking any blood thinning medications. Per RN, FHR tracing reported to be stable enough for sitting procedure. Risks and benefits discussed with patient, including PDPH, backache, epidural hematoma, failed epidural, blood pressure  changes, allergic reaction, and nerve injury. Patient expressed understanding and wished to proceed.)       Anesthesia Quick Evaluation

## 2023-05-08 NOTE — MAU Note (Signed)
.  Andrea Logan is a 37 y.o. at [redacted]w[redacted]d here in MAU reporting: h/o C/S with twins - plans TOLAC. Cerclage removed at 37 weeks.   Contractions every: 6-7 minutes Onset of ctx: yesterday  Pain score: 100/10  ROM: "I don't know" Vaginal Bleeding: "I don't know" Last SVE: denies Labor Pain Management Plan: Planning epidural  Fetal Movement: Reports positive FM FHT: Fetal Heart Rate Mode: External Baseline Rate (A): 155 bpm  OB Office: Faculty GBS: Negative Lab orders placed from triage: MAU Labor Eval

## 2023-05-09 ENCOUNTER — Other Ambulatory Visit (HOSPITAL_COMMUNITY): Payer: Self-pay

## 2023-05-09 LAB — CBC
HCT: 35.6 % — ABNORMAL LOW (ref 36.0–46.0)
Hemoglobin: 11.6 g/dL — ABNORMAL LOW (ref 12.0–15.0)
MCH: 29 pg (ref 26.0–34.0)
MCHC: 32.6 g/dL (ref 30.0–36.0)
MCV: 89 fL (ref 80.0–100.0)
Platelets: 185 10*3/uL (ref 150–400)
RBC: 4 MIL/uL (ref 3.87–5.11)
RDW: 15.2 % (ref 11.5–15.5)
WBC: 15.8 10*3/uL — ABNORMAL HIGH (ref 4.0–10.5)
nRBC: 0 % (ref 0.0–0.2)

## 2023-05-09 LAB — RPR: RPR Ser Ql: NONREACTIVE

## 2023-05-09 MED ORDER — SERTRALINE HCL 25 MG PO TABS
25.0000 mg | ORAL_TABLET | Freq: Every day | ORAL | Status: DC
  Administered 2023-05-09: 25 mg via ORAL
  Filled 2023-05-09: qty 1

## 2023-05-09 MED ORDER — SERTRALINE HCL 25 MG PO TABS
25.0000 mg | ORAL_TABLET | Freq: Every day | ORAL | 1 refills | Status: AC
Start: 2023-05-09 — End: ?
  Filled 2023-05-09 – 2023-06-03 (×2): qty 30, 30d supply, fill #0

## 2023-05-09 MED ORDER — IBUPROFEN 800 MG PO TABS
800.0000 mg | ORAL_TABLET | Freq: Three times a day (TID) | ORAL | 0 refills | Status: DC | PRN
  Filled 2023-05-09: qty 30, 10d supply, fill #0

## 2023-05-09 NOTE — Anesthesia Postprocedure Evaluation (Signed)
Anesthesia Post Note  Patient: Music therapist  Procedure(s) Performed: AN AD HOC LABOR EPIDURAL     Patient location during evaluation: Mother Baby Anesthesia Type: Epidural Level of consciousness: awake and alert and oriented Pain management: satisfactory to patient Vital Signs Assessment: post-procedure vital signs reviewed and stable Respiratory status: respiratory function stable Cardiovascular status: stable Postop Assessment: no headache, no backache, epidural receding, patient able to bend at knees, no signs of nausea or vomiting, adequate PO intake and able to ambulate Anesthetic complications: no   No notable events documented.  Last Vitals:  Vitals:   05/08/23 2359 05/09/23 0450  BP: 113/85 118/68  Pulse: 93   Resp: 18 18  Temp: 36.8 C 36.6 C  SpO2: 99% 99%    Last Pain:  Vitals:   05/09/23 0450  TempSrc: Oral  PainSc:    Pain Goal:                   Carlos American

## 2023-05-09 NOTE — Progress Notes (Signed)
CSW received consult for hx of Depression and Financial assistance.  CSW met with MOB to offer support and complete assessment.  CSW entered the room, introduced herself and explained the reason for the visit. MOB presented bonding/holding  the infant and  was calm, agreeable to consult and remained engaged throughout encounter.  CSW inquired about MOB's mental health history. MOB reported being diagnosed with depression; and declined being prescribed medication. CSW asked MOB if therapy services would be supportive and she said yes. CSW provided [place]OB [place].  MOB reported PPD with all her children and her symptoms typically last until the infant is 6 months. MOB reported coping strategies that include listening to music, eating and exercising. MOB reported her sister as her supports. CSW provided education regarding the baby blues period vs. perinatal mood disorders, discussed treatment and gave resources for mental health follow up if concerns arise.  CSW recommends self-evaluation during the postpartum time period using the New Mom Checklist from Postpartum Progress and encouraged MOB to contact a medical professional if symptoms are noted at any time. CSW assessed for safety with MOB SI/HI/DV;MOB denied all.  CSW asked MOB per chart review SDOH determined difficulties with housing, food and transportation. MOB reported her current apartment  has informed her too many people are residing in her home; she is currently looking for a bigger place to accommodate her family. MOB reported no worries regarding food she has WIC and foodstamps. MOB reported no concerns regarding transportation she has a car currently and eventually will upgrade her car to adapt for all her children. CSW asked MOB has she selected a pediatrician for the infant's follow up visits; MOB said [facility]. MOB reported having all essential items for the infant  including a carseat, bassinet and crib for safe sleeping. CSW provided review of Sudden Infant Death Syndrome (SIDS) precautions.    CSW identifies no further need for intervention and no barriers to discharge at this time.  Enos Fling, Theresia Majors Clinical Social Worker [phone]

## 2023-05-09 NOTE — Progress Notes (Signed)
Post Partum Day 1 Subjective: no complaints, up ad lib, voiding, tolerating PO, and + flatus  Objective: Blood pressure 103/67, pulse 94, temperature 98 F (36.7 C), temperature source Oral, resp. rate 18, height 5\' 9"  (1.753 m), weight 112 kg, last menstrual period 08/10/2022, SpO2 100%, unknown if currently breastfeeding.  Physical Exam:  General: alert, cooperative, and no distress Lochia: appropriate Uterine Fundus: firm Incision:  DVT Evaluation: No evidence of DVT seen on physical exam.  Recent Labs    05/08/23 1253 05/09/23 0416  HGB 13.0 11.6*  HCT 39.6 35.6*    Assessment/Plan: Plan for discharge tomorrow and Breastfeeding  Rx sent to [facility] pharmacy: ibuprofen   LOS: 1 day   Lazaro Arms, MD 05/09/2023, 10:40 AM

## 2023-05-10 ENCOUNTER — Other Ambulatory Visit (HOSPITAL_COMMUNITY): Payer: Self-pay

## 2023-05-10 NOTE — Lactation Note (Signed)
This note was copied from a baby's chart. Lactation Consultation Note  Patient Name: Andrea Logan [id number] Date: 05/10/2023 Age:37 hours  Reason for consult: Follow-up assessment;Early term 60-38.6wks   Mother states her milk is coming in and breast are full. She states she has experienced engorgement before and reviewed management. Mother plans to breast feed and /or pump when she gets home. She currently has pierced nipples and will remove jewelry before attempting to latch or pump.  Maternal Data Has patient been taught Hand Expression?: Yes  Feeding Mother's Current Feeding Choice: Formula Nipple Type: Slow - flow  Interventions Interventions: Education  Discharge Discharge Education: Engorgement and breast care Pump: Personal  Consult Status Consult Status: Complete    Omar Person 05/10/2023, 9:08 AM

## 2023-05-12 ENCOUNTER — Encounter: Payer: Medicaid Other | Admitting: Family Medicine

## 2023-05-16 ENCOUNTER — Telehealth (HOSPITAL_COMMUNITY): Payer: Self-pay | Admitting: *Deleted

## 2023-05-16 NOTE — Telephone Encounter (Signed)
05/16/2023  Name: Andrea Logan MRN: [id number] DOB: 05-06-1986  Reason for Call:  Transition of Care Hospital Discharge Call  Contact Status: Patient Contact Status: Message  Language assistant needed:          Follow-Up Questions:    Inocente Salles Postnatal Depression Scale:  In the Past 7 Days:    PHQ2-9 Depression Scale:     Discharge Follow-up:    Post-discharge interventions: NA  Maudry Diego, RN 05/16/2023 1312

## 2023-06-03 ENCOUNTER — Other Ambulatory Visit: Payer: Self-pay | Admitting: Obstetrics & Gynecology

## 2023-06-03 ENCOUNTER — Other Ambulatory Visit (HOSPITAL_COMMUNITY): Payer: Self-pay

## 2023-06-03 ENCOUNTER — Other Ambulatory Visit: Payer: Self-pay

## 2023-06-05 MED ORDER — IBUPROFEN 800 MG PO TABS
800.0000 mg | ORAL_TABLET | Freq: Three times a day (TID) | ORAL | 0 refills | Status: AC | PRN
  Filled 2023-06-05: qty 30, 10d supply, fill #0

## 2023-06-06 ENCOUNTER — Other Ambulatory Visit: Payer: Self-pay

## 2023-06-06 ENCOUNTER — Other Ambulatory Visit (HOSPITAL_COMMUNITY): Payer: Self-pay

## 2023-06-21 ENCOUNTER — Ambulatory Visit: Payer: Medicaid Other | Admitting: Obstetrics & Gynecology

## 2023-07-04 ENCOUNTER — Telehealth: Payer: Medicaid Other | Admitting: Obstetrics & Gynecology

## 2023-07-04 DIAGNOSIS — Z3009 Encounter for other general counseling and advice on contraception: Secondary | ICD-10-CM | POA: Diagnosis not present

## 2023-07-04 NOTE — Progress Notes (Signed)
TELEHEALTH POSTPARTUM VISIT ENCOUNTER NOTE  Provider location: Center for [facility]   I connected withNAME@ on 07/04/23 at  9:55 AM EST by telephone at home and verified that I am speaking with the correct person using two identifiers.   I discussed the limitations, risks, security and privacy concerns of performing an evaluation and management service by telephone and the availability of in person appointments. I also discussed with the patient that there may be a patient responsible charge related to this service. The patient expressed understanding and agreed to proceed.  Appointment Date: 07/04/2023   Chief Complaint:  Postpartum Visit Physical Exam:  General:  Alert, oriented and cooperative.   Mental Status: Normal mood and affect perceived. Normal judgment and thought content.  Rest of physical exam deferred due to type of encounter  PP Depression Screening:    Edinburgh Postnatal Depression Scale - 07/04/23 1028       Edinburgh Postnatal Depression Scale:  In the Past 7 Days   I have been able to laugh and see the funny side of things. 0    I have looked forward with enjoyment to things. 0    I have blamed myself unnecessarily when things went wrong. 2    I have been anxious or worried for no good reason. 2    I have felt scared or panicky for no good reason. 0    Things have been getting on top of me. 2    I have been so unhappy that I have had difficulty sleeping. 2    I have felt sad or miserable. 0    I have been so unhappy that I have been crying. 1    The thought of harming myself has occurred to me. 0    Edinburgh Postnatal Depression Scale Total 9             I discussed the assessment and treatment plan with the patient. The patient was provided an opportunity to ask questions and all were answered. The patient agreed with the plan and demonstrated an understanding of the instructions.   The patient was advised to call back or seek an  in-person evaluation/go to the ED for any concerning postpartum symptoms.  I provided 10 minutes of non-face-to-face time during this encounter.   Allie Bossier, MD Center for [facility], [facility]    Post Partum Visit Note  Andrea Logan is a 38 y.o. [phone] female who presents for a postpartum visit. She is 6 weeks postpartum following a vacuum-assisted vaginal delivery.  I have fully reviewed the prenatal and intrapartum course. The delivery was at 38 gestational weeks.  Anesthesia: . Postpartum course has been fine. Baby is doing well yes. Baby is feeding by bottle - Similac Sensitive RS. Bleeding no bleeding. Bowel function is normal. Bladder function is normal. Patient is sexually active. Contraception method is condoms. She is interested in permanent sterility in the form of a bilateral salpingectomy. Her FOB declines vasectomy.  Postpartum depression screening: negative.   The pregnancy intention screening data noted above was reviewed. Potential methods of contraception were discussed. The patient elected to proceed with No data recorded.   Edinburgh Postnatal Depression Scale - 07/04/23 1028       Edinburgh Postnatal Depression Scale:  In the Past 7 Days   I have been able to laugh and see the funny side of things. 0    I have looked forward with enjoyment  to things. 0    I have blamed myself unnecessarily when things went wrong. 2    I have been anxious or worried for no good reason. 2    I have felt scared or panicky for no good reason. 0    Things have been getting on top of me. 2    I have been so unhappy that I have had difficulty sleeping. 2    I have felt sad or miserable. 0    I have been so unhappy that I have been crying. 1    The thought of harming myself has occurred to me. 0    Edinburgh Postnatal Depression Scale Total 9             Health Maintenance Due  Topic Date Due   INFLUENZA VACCINE  Never done   COVID-19 Vaccine  (1 - 2024-25 season) Never done    The following portions of the patient's history were reviewed and updated as appropriate: allergies, current medications, past family history, past medical history, past social history, past surgical history, and problem list.  Review of Systems Pertinent items are noted in HPI. Pap 06/2022 - ASCUS negative HR HPV  Objective:  LMP 08/10/2022    Assessment:    1. Postpartum state   2.  Needs pap smear  3. Unwanted fertilty- plans bilateral salpingectomy  Plan:   She will come back and see one of the surgeons to get scheduled for bilateral salpingectomy and will need pap smear at that time.   Salome Holmes, CMA [facility] [employer], [facility]

## 2023-07-18 ENCOUNTER — Ambulatory Visit: Payer: Medicaid Other | Admitting: Obstetrics & Gynecology

## 2023-07-18 ENCOUNTER — Other Ambulatory Visit (HOSPITAL_COMMUNITY)
Admission: RE | Admit: 2023-07-18 | Discharge: 2023-07-18 | Disposition: A | Discharge status: Home / Self Care | Source: Ambulatory Visit | Attending: Obstetrics & Gynecology | Admitting: Obstetrics & Gynecology

## 2023-07-18 VITALS — BP 115/71 | HR 76 | Wt 240.0 lb

## 2023-07-18 DIAGNOSIS — R7309 Other abnormal glucose: Secondary | ICD-10-CM | POA: Diagnosis not present

## 2023-07-18 DIAGNOSIS — Z124 Encounter for screening for malignant neoplasm of cervix: Secondary | ICD-10-CM | POA: Insufficient documentation

## 2023-07-18 DIAGNOSIS — Z3202 Encounter for pregnancy test, result negative: Secondary | ICD-10-CM | POA: Diagnosis not present

## 2023-07-18 DIAGNOSIS — Z30017 Encounter for initial prescription of implantable subdermal contraceptive: Secondary | ICD-10-CM | POA: Diagnosis not present

## 2023-07-18 DIAGNOSIS — Z01419 Encounter for gynecological examination (general) (routine) without abnormal findings: Secondary | ICD-10-CM | POA: Diagnosis not present

## 2023-07-18 LAB — POCT URINE PREGNANCY: Preg Test, Ur: NEGATIVE

## 2023-07-18 MED ORDER — ETONOGESTREL 68 MG ~~LOC~~ IMPL
68.0000 mg | DRUG_IMPLANT | Freq: Once | SUBCUTANEOUS | Status: AC
  Administered 2023-07-18: 68 mg via SUBCUTANEOUS

## 2023-07-18 NOTE — Progress Notes (Unsigned)
 GYNECOLOGY ANNUAL PHYSICAL EXAM PROGRESS NOTE  Subjective:    Andrea Logan is a 38 y.o. single [id number] who presents for an annual exam.  The patient is sexually active. The patient participates in regular exercise: no. Has the patient ever been transfused or tattooed?: yes. The patient reports that there is not domestic violence in her life.   The patient has the following complaints today: She wants to get a Nexplanon today. She is considering a BTL in the future. Currently she last had sex right before her period started last week. She is no longer breastfeeding.  She thought that she had signed medicaid tubal papers, but they are not in her chart. She is considering this method.   Menstrual History: Menarche age: 60 Patient's last menstrual period was 07/04/2023 (approximate).     Gynecologic History:   Last Pap: 2024. Results were: ASCUS.         OB History  Gravida Para Term Preterm AB Living  6 5 5  0 1 6  SAB IAB Ectopic Multiple Live Births  1 0 0 1 6    # Outcome Date GA Lbr Len/2nd Weight Sex Type Anes PTL Lv  6 Term 05/08/23 [redacted]w[redacted]d 10:50 / 00:16 7 lb 4.1 oz (3.29 kg) M VBAC EPI  LIV     Name: Darrick Meigs [name]     Apgar1: 9  Apgar5: 10  5A Term 03/16/21 [redacted]w[redacted]d 04:24 / 00:05 6 lb 8.1 oz (2.95 kg) M Vag-Spont EPI  LIV     Name: Towles,BOYA Cathern     Apgar1: 1  Apgar5: 9  5B Term 03/16/21 [redacted]w[redacted]d 04:24 / 00:21 4 lb 13.3 oz (2.19 kg) F CS-LTranv EPI  LIV     Name: Magoon,GIRLB Erna     Apgar1: 3  Apgar5: 9  4 Term 11/25/13 [redacted]w[redacted]d  7 lb 10 oz (3.459 kg) M Vag-Spont   LIV  3 Term 04/20/12 [redacted]w[redacted]d  7 lb 8 oz (3.402 kg) M Vag-Spont  Y LIV  2 Term 06/20/10 [redacted]w[redacted]d  9 lb 14 oz (4.479 kg) F Vag-Spont   LIV  1 SAB 02/26/10 [redacted]w[redacted]d           Past Medical History:  Diagnosis Date   Alpha thalassemia silent carrier 10/14/2020   Anemia    Depression 08/24/2020   Trichomonal vaginitis 07/13/2021   Vaginal Pap smear, abnormal     Past Surgical History:   Procedure Laterality Date   CERVICAL CERCLAGE     CERVICAL CERCLAGE N/A 10/08/2020   Procedure: CERCLAGE CERVICAL;  Surgeon: Adam Phenix, MD;  Location: [facility];  Service: Gynecology;  Laterality: N/A;   CERVICAL CERCLAGE N/A 11/08/2022   Procedure: CERCLAGE CERVICAL;  Surgeon: Adam Phenix, MD;  Location: [facility];  Service: Gynecology;  Laterality: N/A;   CERVICAL CONE BIOPSY     CESAREAN SECTION N/A 03/16/2021   Procedure: CESAREAN SECTION;  Surgeon: Venora Maples, MD;  Location: [facility];  Service: Obstetrics;  Laterality: N/A;    Family History  Problem Relation Age of Onset   Hypertension Mother    Diabetes Mother    Dementia Father    Cervical cancer Maternal Aunt     Social History   Socioeconomic History   Marital status: Single    Spouse name: Not on file   Number of children: 3   Years of education: Not on file   Highest education level: Associate degree: occupational, Scientist, product/process development, or vocational program  Occupational History   Not on file  Tobacco Use   Smoking status: Never   Smokeless tobacco: Never  Vaping Use   Vaping status: Never Used  Substance and Sexual Activity   Alcohol use: No    Alcohol/week: 0.0 standard drinks of alcohol   Drug use: Never   Sexual activity: Not Currently    Birth control/protection: None  Other Topics Concern   Not on file  Social History Narrative   Not on file   Social Drivers of Health   Financial Resource Strain: Low Risk  (07/12/2022)   Received from [facility], [facility]   Overall Financial Resource Strain (CARDIA)    Difficulty of Paying Living Expenses: Not hard at all  Food Insecurity: No Food Insecurity (05/08/2023)   Hunger Vital Sign    Worried About Running Out of Food in the Last Year: Never true    Ran Out of Food in the Last Year: Never true  Transportation Needs: Unmet Transportation Needs (05/08/2023)   PRAPARE - Administrator, Civil Service (Medical): Yes    Lack of  Transportation (Non-Medical): Yes  Physical Activity: Not on file  Stress: Not on file  Social Connections: Unknown (07/05/2022)   Received from [facility], [facility]   Social Network    Social Network: Not on file  Intimate Partner Violence: Not At Risk (05/08/2023)   Humiliation, Afraid, Rape, and Kick questionnaire    Fear of Current or Ex-Partner: No    Emotionally Abused: No    Physically Abused: No    Sexually Abused: No    Current Outpatient Medications on File Prior to Visit  Medication Sig Dispense Refill   HYDROcodone-acetaminophen (NORCO/VICODIN) 5-325 MG tablet Take 1 tablet by mouth every 6 (six) hours as needed for moderate pain (pain score 4-6).     ibuprofen (ADVIL) 800 MG tablet Take 1 tablet (800 mg total) by mouth every 8 (eight) hours as needed. 30 tablet 0   Prenatal Vit-Fe Fumarate-FA (PRENATAL VITAMINS) 28-0.8 MG TABS Take 1 tablet by mouth daily. 30 tablet 11   sertraline (ZOLOFT) 25 MG tablet Take 1 tablet (25 mg total) by mouth daily. 30 tablet 1   No current facility-administered medications on file prior to visit.    No Known Allergies   Review of Systems Constitutional: negative for chills, fatigue, fevers and sweats Eyes: negative for irritation, redness and visual disturbance Ears, nose, mouth, throat, and face: negative for hearing loss, nasal congestion, snoring and tinnitus Respiratory: negative for asthma, cough, sputum Cardiovascular: negative for chest pain, dyspnea, exertional chest pressure/discomfort, irregular heart beat, palpitations and syncope Gastrointestinal: negative for abdominal pain, change in bowel habits, nausea and vomiting Genitourinary: negative for abnormal menstrual periods, genital lesions, sexual problems and vaginal discharge, dysuria and urinary incontinence Integument/breast: negative for breast lump, breast tenderness and nipple discharge Hematologic/lymphatic: negative for bleeding and easy  bruising Musculoskeletal:negative for back pain and muscle weakness Neurological: negative for dizziness, headaches, vertigo and weakness Endocrine: negative for diabetic symptoms including polydipsia, polyuria and skin dryness Allergic/Immunologic: negative for hay fever and urticaria      Objective:  Blood pressure 115/71, pulse 76, weight 240 lb (108.9 kg), last menstrual period 07/04/2023, not currently breastfeeding. Body mass index is 35.44 kg/m.    General Appearance:    Alert, cooperative, no distress, appears stated age  Head:    Normocephalic, without obvious abnormality, atraumatic  Eyes:    PERRL, conjunctiva/corneas clear, EOM's intact, both eyes  Ears:  Normal external ear canals, both ears  Nose:   Nares normal, septum midline, mucosa normal, no drainage or sinus tenderness  Throat:   Lips, mucosa, and tongue normal; teeth and gums normal  Neck:   Supple, symmetrical, trachea midline, no adenopathy; thyroid: no enlargement/tenderness/nodules; no carotid bruit or JVD  Back:     Symmetric, no curvature, ROM normal, no CVA tenderness  Lungs:     Clear to auscultation bilaterally, respirations unlabored  Chest Wall:    No tenderness or deformity   Heart:    Regular rate and rhythm, S1 and S2 normal, no murmur, rub or gallop  Breast Exam:    No tenderness, masses, or nipple abnormality, nipples pierced bilaterally  Abdomen:     Soft, non-tender, bowel sounds active all four quadrants, no masses, no organomegaly.    Genitalia:    Pelvic:external genitalia normal, vagina without lesions, discharge, or tenderness, rectovaginal septum  normal. Cervix normal in appearance, no cervical motion tenderness, no adnexal masses or tenderness.  Uterus normal size, shape, mobile, regular contours, nontender.  Rectal:    Normal external sphincter.  No hemorrhoids appreciated. Internal exam not done.   Extremities:   Extremities normal, atraumatic, no cyanosis or edema  Pulses:   2+ and  symmetric all extremities  Skin:   Skin color, texture, turgor normal, no rashes or lesions  Lymph nodes:   Cervical, supraclavicular, and axillary nodes normal  Neurologic:   CNII-XII intact, normal strength, sensation and reflexes throughout   UPT was negative. Consent was signed. Time out procedure was done. Her left arm was prepped with betadine and infiltrated with 3 cc of 1% lidocaine at the site of her previous Nexplanon. After adequate anesthesia was assured, the Nexplanon device was placed according to standard of care. Her arm was hemostatic and was bandaged. She tolerated the procedure well.   Labs:  Lab Results  Component Value Date   WBC 15.8 (H) 05/09/2023   HGB 11.6 (L) 05/09/2023   HCT 35.6 (L) 05/09/2023   MCV 89.0 05/09/2023   PLT 185 05/09/2023    Lab Results  Component Value Date   CREATININE 0.76 11/02/2022   BUN 8 11/02/2022   NA 141 11/02/2022   K 4.1 11/02/2022   CL 110 (H) 11/02/2022   CO2 17 (L) 11/02/2022    Lab Results  Component Value Date   ALT 14 11/02/2022   AST 12 11/02/2022   ALKPHOS 57 11/02/2022   BILITOT 0.3 11/02/2022    Lab Results  Component Value Date   TSH 0.944 02/06/2015     Assessment:   Well woman exam Contraception   Plan:   Breast self exam technique reviewed and patient encouraged to perform self-exam monthly. Contraception: Nexplanon. I rec'd a week of back up method. Discussed healthy lifestyle modifications. Pap smear ordered.  Follow up in 1 year for annual exam or prn sooner to schedule a BTL with one of the surgeons.   Allie Bossier, MD [facility]

## 2023-07-22 LAB — CYTOLOGY - PAP
Comment: NEGATIVE
Diagnosis: UNDETERMINED — AB
High risk HPV: NEGATIVE

## 2023-07-25 ENCOUNTER — Encounter: Payer: Self-pay | Admitting: Obstetrics & Gynecology

## 2023-08-10 DIAGNOSIS — Z6836 Body mass index (BMI) 36.0-36.9, adult: Secondary | ICD-10-CM | POA: Diagnosis not present

## 2023-08-10 DIAGNOSIS — D563 Thalassemia minor: Secondary | ICD-10-CM | POA: Diagnosis not present

## 2023-08-10 DIAGNOSIS — Z0000 Encounter for general adult medical examination without abnormal findings: Secondary | ICD-10-CM | POA: Diagnosis not present

## 2023-08-10 DIAGNOSIS — Z111 Encounter for screening for respiratory tuberculosis: Secondary | ICD-10-CM | POA: Diagnosis not present

## 2023-08-10 DIAGNOSIS — D500 Iron deficiency anemia secondary to blood loss (chronic): Secondary | ICD-10-CM | POA: Diagnosis not present

## 2023-08-10 DIAGNOSIS — R635 Abnormal weight gain: Secondary | ICD-10-CM | POA: Diagnosis not present

## 2023-08-10 DIAGNOSIS — Z13220 Encounter for screening for lipoid disorders: Secondary | ICD-10-CM | POA: Diagnosis not present

## 2023-08-10 DIAGNOSIS — F53 Postpartum depression: Secondary | ICD-10-CM | POA: Diagnosis not present

## 2023-09-12 DIAGNOSIS — Z0120 Encounter for dental examination and cleaning without abnormal findings: Secondary | ICD-10-CM | POA: Diagnosis not present

## 2023-10-27 ENCOUNTER — Ambulatory Visit: Admitting: Obstetrics and Gynecology

## 2023-10-27 ENCOUNTER — Other Ambulatory Visit (HOSPITAL_COMMUNITY)
Admission: RE | Admit: 2023-10-27 | Discharge: 2023-10-27 | Disposition: A | Discharge status: Home / Self Care | Source: Ambulatory Visit | Attending: Obstetrics and Gynecology | Admitting: Obstetrics and Gynecology

## 2023-10-27 ENCOUNTER — Encounter: Payer: Self-pay | Admitting: Obstetrics and Gynecology

## 2023-10-27 ENCOUNTER — Other Ambulatory Visit

## 2023-10-27 VITALS — BP 96/67 | HR 79 | Wt 241.0 lb

## 2023-10-27 DIAGNOSIS — Z113 Encounter for screening for infections with a predominantly sexual mode of transmission: Secondary | ICD-10-CM | POA: Diagnosis not present

## 2023-10-27 DIAGNOSIS — Z3046 Encounter for surveillance of implantable subdermal contraceptive: Secondary | ICD-10-CM

## 2023-10-27 NOTE — Progress Notes (Signed)
     GYNECOLOGY OFFICE PROCEDURE NOTE  Andrea Logan is a 38 y.o. [id number] here for Nexplanon  removal 2/2 irregular vaginal bleeding.  Last pap smear was on 07/18/23 and was ASCUS with negative HPV.  PT desires STD testing.   Nexplanon  Removal Patient identified, informed consent performed, consent signed.   Appropriate time out taken. Nexplanon  site identified.  Area prepped in usual sterile fashon. One ml of 1% lidocaine  was used to anesthetize the area at the distal end of the implant. A Hyams stab incision was made right beside the implant on the distal portion.  The Nexplanon  rod was grasped using hemostats and removed without difficulty.  There was minimal blood loss. There were no complications.  3 ml of 1% lidocaine  was injected around the incision for post-procedure analgesia.  Steri-strips were applied over the Maglione incision.  A pressure bandage was applied to reduce any bruising.  The patient tolerated the procedure well and was given post procedure instructions.  Patient is planning to use no birth control method at this time.  Blood and vaginal STD testing without incident.  Avie Boeck, MD, FACOG Obstetrician & Gynecologist, [facility] for [facility], [facility]

## 2023-10-27 NOTE — Progress Notes (Addendum)
 38 y.o. GYN presents for Nexplanon  removal and STD screening, c/o vaginal discharge, side effects from the [facility].  Self swab done and labs drawn.

## 2023-10-28 LAB — CERVICOVAGINAL ANCILLARY ONLY
Bacterial Vaginitis (gardnerella): POSITIVE — AB
Candida Glabrata: NEGATIVE
Candida Vaginitis: NEGATIVE
Chlamydia: NEGATIVE
Comment: NEGATIVE
Comment: NEGATIVE
Comment: NEGATIVE
Comment: NEGATIVE
Comment: NEGATIVE
Comment: NORMAL
Neisseria Gonorrhea: NEGATIVE
Trichomonas: NEGATIVE

## 2023-10-28 LAB — HEPATITIS C ANTIBODY: Hep C Virus Ab: NONREACTIVE

## 2023-10-28 LAB — HEPATITIS B SURFACE ANTIGEN: Hepatitis B Surface Ag: NEGATIVE

## 2023-10-28 LAB — RPR: RPR Ser Ql: NONREACTIVE

## 2023-10-28 LAB — HIV ANTIBODY (ROUTINE TESTING W REFLEX): HIV Screen 4th Generation wRfx: NONREACTIVE

## 2023-11-04 ENCOUNTER — Ambulatory Visit: Payer: Self-pay | Admitting: Obstetrics and Gynecology

## 2023-11-04 MED ORDER — METRONIDAZOLE 500 MG PO TABS: 500.0000 mg | ORAL_TABLET | Freq: Two times a day (BID) | ORAL | 0 refills | Status: AC

## 2023-11-22 DIAGNOSIS — Z0120 Encounter for dental examination and cleaning without abnormal findings: Secondary | ICD-10-CM | POA: Diagnosis not present

## 2023-11-28 DIAGNOSIS — Z0120 Encounter for dental examination and cleaning without abnormal findings: Secondary | ICD-10-CM | POA: Diagnosis not present
